# Patient Record
Sex: Female | Born: 1940
Health system: Southern US, Community
[De-identification: ages and names within clinical notes are randomized; demographics above are authoritative.]

## PROBLEM LIST (undated history)

## (undated) DIAGNOSIS — Z8719 Personal history of other diseases of the digestive system: Secondary | ICD-10-CM

## (undated) DIAGNOSIS — F419 Anxiety disorder, unspecified: Secondary | ICD-10-CM

## (undated) DIAGNOSIS — R1310 Dysphagia, unspecified: Secondary | ICD-10-CM

## (undated) DIAGNOSIS — G44039 Episodic paroxysmal hemicrania, not intractable: Secondary | ICD-10-CM

## (undated) DIAGNOSIS — K5730 Diverticulosis of large intestine without perforation or abscess without bleeding: Secondary | ICD-10-CM

## (undated) DIAGNOSIS — Z8669 Personal history of other diseases of the nervous system and sense organs: Secondary | ICD-10-CM

## (undated) DIAGNOSIS — D259 Leiomyoma of uterus, unspecified: Secondary | ICD-10-CM

## (undated) DIAGNOSIS — E119 Type 2 diabetes mellitus without complications: Secondary | ICD-10-CM

## (undated) DIAGNOSIS — Z973 Presence of spectacles and contact lenses: Secondary | ICD-10-CM

## (undated) DIAGNOSIS — F32A Depression, unspecified: Secondary | ICD-10-CM

## (undated) DIAGNOSIS — C801 Malignant (primary) neoplasm, unspecified: Secondary | ICD-10-CM

## (undated) DIAGNOSIS — K649 Unspecified hemorrhoids: Secondary | ICD-10-CM

## (undated) DIAGNOSIS — R739 Hyperglycemia, unspecified: Secondary | ICD-10-CM

## (undated) DIAGNOSIS — K635 Polyp of colon: Secondary | ICD-10-CM

## (undated) DIAGNOSIS — M5137 Other intervertebral disc degeneration, lumbosacral region: Secondary | ICD-10-CM

## (undated) DIAGNOSIS — M51369 Other intervertebral disc degeneration, lumbar region without mention of lumbar back pain or lower extremity pain: Secondary | ICD-10-CM

## (undated) DIAGNOSIS — Z720 Tobacco use: Secondary | ICD-10-CM

## (undated) DIAGNOSIS — L719 Rosacea, unspecified: Secondary | ICD-10-CM

## (undated) DIAGNOSIS — H269 Unspecified cataract: Secondary | ICD-10-CM

## (undated) DIAGNOSIS — Z9889 Other specified postprocedural states: Secondary | ICD-10-CM

## (undated) DIAGNOSIS — M5136 Other intervertebral disc degeneration, lumbar region: Secondary | ICD-10-CM

## (undated) DIAGNOSIS — K589 Irritable bowel syndrome without diarrhea: Secondary | ICD-10-CM

## (undated) DIAGNOSIS — R918 Other nonspecific abnormal finding of lung field: Secondary | ICD-10-CM

## (undated) DIAGNOSIS — I499 Cardiac arrhythmia, unspecified: Secondary | ICD-10-CM

## (undated) DIAGNOSIS — F458 Other somatoform disorders: Secondary | ICD-10-CM

## (undated) DIAGNOSIS — M1990 Unspecified osteoarthritis, unspecified site: Secondary | ICD-10-CM

## (undated) DIAGNOSIS — I10 Essential (primary) hypertension: Secondary | ICD-10-CM

## (undated) DIAGNOSIS — M51379 Other intervertebral disc degeneration, lumbosacral region without mention of lumbar back pain or lower extremity pain: Secondary | ICD-10-CM

## (undated) DIAGNOSIS — I480 Paroxysmal atrial fibrillation: Secondary | ICD-10-CM

## (undated) DIAGNOSIS — F329 Major depressive disorder, single episode, unspecified: Secondary | ICD-10-CM

## (undated) DIAGNOSIS — Z87898 Personal history of other specified conditions: Secondary | ICD-10-CM

## (undated) DIAGNOSIS — T783XXA Angioneurotic edema, initial encounter: Secondary | ICD-10-CM

## (undated) HISTORY — DX: Other nonspecific abnormal finding of lung field: R91.8

## (undated) HISTORY — DX: Diverticulosis of large intestine without perforation or abscess without bleeding: K57.30

## (undated) HISTORY — DX: Other intervertebral disc degeneration, lumbosacral region: M51.37

## (undated) HISTORY — DX: Other intervertebral disc degeneration, lumbosacral region without mention of lumbar back pain or lower extremity pain: M51.379

## (undated) HISTORY — DX: Other intervertebral disc degeneration, lumbar region without mention of lumbar back pain or lower extremity pain: M51.369

## (undated) HISTORY — DX: Unspecified cataract: H26.9

## (undated) HISTORY — DX: Polyp of colon: K63.5

## (undated) HISTORY — DX: Personal history of other diseases of the digestive system: Z87.19

## (undated) HISTORY — PX: CATARACT EXTRACTION: SUR2

## (undated) HISTORY — DX: Tobacco use: Z72.0

## (undated) HISTORY — DX: Malignant (primary) neoplasm, unspecified: C80.1

## (undated) HISTORY — PX: TONSILLECTOMY: SUR1361

## (undated) HISTORY — DX: Irritable bowel syndrome, unspecified: K58.9

## (undated) HISTORY — DX: Unspecified hemorrhoids: K64.9

## (undated) HISTORY — DX: Other intervertebral disc degeneration, lumbar region: M51.36

## (undated) HISTORY — DX: Other specified postprocedural states: Z98.890

## (undated) HISTORY — DX: Hyperglycemia, unspecified: R73.9

## (undated) HISTORY — DX: Rosacea, unspecified: L71.9

## (undated) HISTORY — DX: Personal history of other specified conditions: Z87.898

## (undated) HISTORY — DX: Angioneurotic edema, initial encounter: T78.3XXA

## (undated) HISTORY — PX: DILATION AND CURETTAGE OF UTERUS: SHX78

## (undated) HISTORY — DX: Other somatoform disorders: F45.8

## (undated) HISTORY — DX: Leiomyoma of uterus, unspecified: D25.9

## (undated) HISTORY — DX: Episodic paroxysmal hemicrania, not intractable: G44.039

## (undated) HISTORY — DX: Paroxysmal atrial fibrillation: I48.0

## (undated) HISTORY — PX: CATARACT EXTRACTION, BILATERAL: SHX1313

---
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
## 1965-10-30 HISTORY — PX: OTHER SURGICAL HISTORY: SHX169

## 1999-05-23 ENCOUNTER — Other Ambulatory Visit: Admission: RE | Admit: 1999-05-23 | Discharge: 1999-05-23 | Payer: Self-pay | Admitting: Obstetrics and Gynecology

## 2000-05-28 ENCOUNTER — Other Ambulatory Visit: Admission: RE | Admit: 2000-05-28 | Discharge: 2000-05-28 | Payer: Self-pay | Admitting: Obstetrics and Gynecology

## 2001-06-17 ENCOUNTER — Other Ambulatory Visit: Admission: RE | Admit: 2001-06-17 | Discharge: 2001-06-17 | Payer: Self-pay | Admitting: Obstetrics and Gynecology

## 2004-12-19 ENCOUNTER — Emergency Department (HOSPITAL_COMMUNITY): Admission: EM | Admit: 2004-12-19 | Discharge: 2004-12-19 | Payer: Self-pay | Admitting: Emergency Medicine

## 2005-01-12 ENCOUNTER — Ambulatory Visit: Payer: Self-pay | Admitting: Family Medicine

## 2005-01-16 ENCOUNTER — Encounter: Admission: RE | Admit: 2005-01-16 | Discharge: 2005-01-16 | Payer: Self-pay | Admitting: Family Medicine

## 2005-03-01 ENCOUNTER — Ambulatory Visit: Payer: Self-pay | Admitting: Family Medicine

## 2005-03-07 ENCOUNTER — Ambulatory Visit: Payer: Self-pay | Admitting: Family Medicine

## 2006-07-24 ENCOUNTER — Ambulatory Visit: Payer: Self-pay | Admitting: Gastroenterology

## 2006-08-07 ENCOUNTER — Encounter (INDEPENDENT_AMBULATORY_CARE_PROVIDER_SITE_OTHER): Payer: Self-pay | Admitting: *Deleted

## 2006-08-07 ENCOUNTER — Ambulatory Visit: Payer: Self-pay | Admitting: Gastroenterology

## 2006-08-23 ENCOUNTER — Encounter (INDEPENDENT_AMBULATORY_CARE_PROVIDER_SITE_OTHER): Payer: Self-pay | Admitting: Specialist

## 2006-08-23 ENCOUNTER — Ambulatory Visit (HOSPITAL_COMMUNITY): Admission: RE | Admit: 2006-08-23 | Discharge: 2006-08-23 | Payer: Self-pay | Admitting: Obstetrics and Gynecology

## 2006-12-27 ENCOUNTER — Encounter (INDEPENDENT_AMBULATORY_CARE_PROVIDER_SITE_OTHER): Payer: Self-pay | Admitting: Specialist

## 2006-12-27 ENCOUNTER — Ambulatory Visit (HOSPITAL_COMMUNITY): Admission: RE | Admit: 2006-12-27 | Discharge: 2006-12-27 | Payer: Self-pay | Admitting: Obstetrics and Gynecology

## 2007-09-19 ENCOUNTER — Ambulatory Visit: Payer: Self-pay | Admitting: Family Medicine

## 2007-10-31 HISTORY — PX: CHOLECYSTECTOMY: SHX55

## 2008-07-04 ENCOUNTER — Inpatient Hospital Stay (HOSPITAL_COMMUNITY): Admission: EM | Admit: 2008-07-04 | Discharge: 2008-07-06 | Payer: Self-pay | Admitting: Emergency Medicine

## 2008-07-04 ENCOUNTER — Encounter (INDEPENDENT_AMBULATORY_CARE_PROVIDER_SITE_OTHER): Payer: Self-pay | Admitting: Surgery

## 2008-07-09 ENCOUNTER — Inpatient Hospital Stay (HOSPITAL_COMMUNITY): Admission: AD | Admit: 2008-07-09 | Discharge: 2008-07-14 | Payer: Self-pay | Admitting: Surgery

## 2008-07-10 ENCOUNTER — Ambulatory Visit: Payer: Self-pay | Admitting: Vascular Surgery

## 2008-07-10 ENCOUNTER — Encounter (INDEPENDENT_AMBULATORY_CARE_PROVIDER_SITE_OTHER): Payer: Self-pay | Admitting: Surgery

## 2009-03-24 ENCOUNTER — Encounter: Payer: Self-pay | Admitting: Family Medicine

## 2009-08-10 ENCOUNTER — Encounter: Payer: Self-pay | Admitting: Family Medicine

## 2009-08-11 ENCOUNTER — Ambulatory Visit: Payer: Self-pay | Admitting: Family Medicine

## 2009-08-11 LAB — CONVERTED CEMR LAB
Bilirubin Urine: NEGATIVE
Urobilinogen, UA: 0.2
pH: 5

## 2009-08-17 LAB — CONVERTED CEMR LAB
ALT: 21 units/L (ref 0–35)
AST: 21 units/L (ref 0–37)
Albumin: 4.2 g/dL (ref 3.5–5.2)
BUN: 19 mg/dL (ref 6–23)
Basophils Absolute: 0 10*3/uL (ref 0.0–0.1)
Bilirubin, Direct: 0 mg/dL (ref 0.0–0.3)
Calcium: 9.5 mg/dL (ref 8.4–10.5)
Chloride: 103 meq/L (ref 96–112)
Eosinophils Absolute: 0.1 10*3/uL (ref 0.0–0.7)
Eosinophils Relative: 1.6 % (ref 0.0–5.0)
GFR calc non Af Amer: 88.23 mL/min (ref >60)
Glucose, Bld: 98 mg/dL (ref 70–99)
HCT: 39.8 % (ref 36.0–46.0)
HDL: 40.2 mg/dL (ref >39.00)
Lymphocytes Relative: 26.2 % (ref 12.0–46.0)
MCV: 89.1 fL (ref 78.0–100.0)
Monocytes Relative: 7.1 % (ref 3.0–12.0)
Neutrophils Relative %: 64.4 % (ref 43.0–77.0)
Platelets: 376 10*3/uL (ref 150.0–400.0)
Potassium: 4.6 meq/L (ref 3.5–5.1)
TSH: 0.78 microintl units/mL (ref 0.35–5.50)
Total Bilirubin: 0.7 mg/dL (ref 0.3–1.2)
Total CHOL/HDL Ratio: 4
Total Protein: 6.8 g/dL (ref 6.0–8.3)
Triglycerides: 101 mg/dL (ref 0.0–149.0)
VLDL: 20.2 mg/dL (ref 0.0–40.0)

## 2009-08-23 ENCOUNTER — Ambulatory Visit: Payer: Self-pay | Admitting: Pulmonary Disease

## 2010-03-29 ENCOUNTER — Encounter: Payer: Self-pay | Admitting: Family Medicine

## 2010-03-30 ENCOUNTER — Encounter: Payer: Self-pay | Admitting: *Deleted

## 2010-07-20 ENCOUNTER — Ambulatory Visit: Payer: Self-pay | Admitting: Family Medicine

## 2010-11-27 LAB — CONVERTED CEMR LAB
ALT: 22 units/L (ref 0–35)
AST: 21 units/L (ref 0–37)
BUN: 8 mg/dL (ref 6–23)
Basophils Absolute: 0 10*3/uL (ref 0.0–0.1)
Basophils Relative: 0.2 % (ref 0.0–1.0)
Bilirubin, Direct: 0.1 mg/dL (ref 0.0–0.3)
CO2: 26 meq/L (ref 19–32)
Cholesterol: 203 mg/dL (ref 0–200)
Eosinophils Absolute: 0.1 10*3/uL (ref 0.0–0.6)
Eosinophils Relative: 1 % (ref 0.0–5.0)
GFR calc Af Amer: 129 mL/min
Glucose, Bld: 94 mg/dL (ref 70–99)
HCT: 42.2 % (ref 36.0–46.0)
Hemoglobin: 14.9 g/dL (ref 12.0–15.0)
Lymphocytes Relative: 28.9 % (ref 12.0–46.0)
Monocytes Relative: 6.3 % (ref 3.0–11.0)
Neutro Abs: 5.6 10*3/uL (ref 1.4–7.7)
Platelets: 292 10*3/uL (ref 150–400)
Potassium: 4.1 meq/L (ref 3.5–5.1)
RBC: 4.79 M/uL (ref 3.87–5.11)
Sodium: 142 meq/L (ref 135–145)
Total Bilirubin: 0.6 mg/dL (ref 0.3–1.2)
Total Protein: 7.2 g/dL (ref 6.0–8.3)
VLDL: 23 mg/dL (ref 0–40)

## 2010-11-29 NOTE — Assessment & Plan Note (Signed)
Summary: FLU SHOT/CJR  Nurse Visit   Allergies: 1)  ! Biaxin 2)  ! Codeine  Orders Added: 1)  Flu Vaccine 1yrs + MEDICARE PATIENTS [Q2039] 2)  Administration Flu vaccine - MCR [G0008] Flu Vaccine Consent Questions     Do you have a history of severe allergic reactions to this vaccine? no    Any prior history of allergic reactions to egg and/or gelatin? no    Do you have a sensitivity to the preservative Thimersol? no    Do you have a past history of Guillan-Barre Syndrome? no    Do you currently have an acute febrile illness? no    Have you ever had a severe reaction to latex? no    Vaccine information given and explained to patient? yes    Are you currently pregnant? no    Lot Number:[id number]   Exp Date:04/29/2011   Site Given  Left Deltoid IM .lbmedflu

## 2010-11-29 NOTE — Miscellaneous (Signed)
  Clinical Lists Changes  Observations: Added new observation of MAMMOGRAM: normal (03/29/2010 10:33)      Preventive Care Screening  Mammogram:    Date:  03/29/2010    Results:  normal

## 2010-12-22 ENCOUNTER — Other Ambulatory Visit: Payer: Self-pay | Admitting: Dermatology

## 2011-03-14 NOTE — Discharge Summary (Signed)
[name], [name]                  ACCOUNT NO.:  [id number]   MEDICAL RECORD NO.:  [id number]          PATIENT TYPE:  INP   LOCATION:  5118                         FACILITY:  [facility]   PHYSICIAN:  Thornton Park. Daphine Deutscher, MD  DATE OF BIRTH:  1941/08/01   DATE OF ADMISSION:  07/09/2008  DATE OF DISCHARGE:  07/14/2008                               DISCHARGE SUMMARY   ADMITTING DIAGNOSIS:  Abdominal pain after cholecystectomy.   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   COURSE IN THE HOSPITAL:  Angel Holland is a 70 year old lady who had a lap  cole on July 04, 2008, for severe cholecystitis.  Drain was placed  and she was discharged on July 06, 2008.  Her wounds were looking  fine, but she started having pain and she came back and was readmitted.  There was some dark drainage in her JP and there was a question whether  she might have had a bile leak.  Studies did not show any evidence of a  bile leak and the drainage remained minimal.  The ultrasound showed a  questionable clot in the middle hepatic vein.  I discussed this with a  hematology oncology consultant who was totally on the fence whether to  treat this or not.  I discussed coumadinization and actually initially  she got it 1 mg dose of Coumadin.  But after talking further with her,  she had significant reservations because her husband had an  intracerebral hemorrhage while on Coumadin.  She seemed to be very well  aware of the risk and did not want to take Coumadin.  I discussed this  with her length and we decided that we would go forward with her  continuing her baby aspirin every day.  Therefore, on July 14, 2008, I removed her Jackson-Pratt drain, which had minimal  serosanguineous drainage, discharged her to take an aspirin every day.   FINAL DIAGNOSES:  1. Status post laparoscopic cholecystectomy.  2. Pain resolved with evidence of middle hepatic vein partial      nonoccluding  thrombus.      Thornton Park Daphine Deutscher, MD  Electronically Signed     MBM/MEDQ  D:  07/14/2008  T:  07/14/2008  Job:  [id number]   cc:   Wilmon Arms. Tsuei, M.D.

## 2011-03-14 NOTE — H&P (Signed)
NAMEKIMAYA, Angel Holland                  ACCOUNT NO.:  [id number]   MEDICAL RECORD NO.:  [id number]          PATIENT TYPE:  INP   LOCATION:  5128                         FACILITY:  [facility]   PHYSICIAN:  Ollen Gross. Vernell Morgans, M.D. DATE OF BIRTH:  1941-08-23   DATE OF ADMISSION:  07/03/2008  DATE OF DISCHARGE:                              HISTORY & PHYSICAL   Angel Holland is a 70 year old white female who went to [employer] this  past Wednesday, had about 3 slices of pizza, and then that evening  developed severe right upper quadrant pain.  The pain has been  associated with nausea and vomiting.  The pain has not improved since  Wednesday.  She has run some low grade fevers at home.  She did have  some diarrhea associated with this as well.  She otherwise denies any  chest pain, shortness of breath, or dysuria.  Her other review of  systems are unremarkable.   PAST MEDICAL HISTORY:  Significant for uterine tumor.   PAST SURGICAL HISTORY:  Significant for appendectomy, tubal ligation,  and D&C.   MEDICATIONS:  None.   ALLERGIES:  BIAXIN and CODEINE.   SOCIAL HISTORY:  She does smoke about a pack of cigarettes a day and  denies any alcohol use.   FAMILY HISTORY:  Noncontributory.   PHYSICAL EXAMINATION:  VITAL SIGNS:  Temperature is 99.5, blood pressure  is 97/64, and pulse is 84.  GENERAL:  Well-developed and well-nourished white female in no acute  distress.  SKIN:  Warm and dry.  No Jaundice.  HEENT:  Eyes, extraocular movements are intact.  Pupils are equal,  round, and reactive to light.  Sclerae nonicteric.  LUNGS:  Clear bilaterally with no use of accessory inspiratory muscles.  HEART:  Regular rate and rhythm with impulse in the left chest.  ABDOMEN:  Soft, but she has moderate-to-severe right upper quadrant  tenderness with guarding in that spot.  No general peritonitis.  EXTREMITIES:  No cyanosis, clubbing, or edema.  Good strength in arms  and legs.  PSYCHOLOGIC:  Alert and  oriented x3 with no evidence of anxiety or  depression.   On review of her lab work, it was significant for a white count of  17,000 and her LFTs were normal.  Her ultrasound was reviewed with  radiologist, it did show stones in her gallbladder and some gallbladder  wall thickening, but no ductal dilatation.   ASSESSMENT AND PLAN:  This is a 70 year old white female with what  appears to be cholecystitis with cholelithiasis.  Because of the risk of  further painful episodes, I did think she would benefit from having her  gallbladder removed.  We will plan to admit her in 5100 and start her on  broad-spectrum antibiotics, and then plan for surgery in the next day or  so.  I have discussed to her in detail the risks and benefits of the  operation due to this as well as some technical aspects, which she  understands, and wish to proceed.      Ollen Gross. Vernell Morgans, M.D.  Electronically Signed     PST/MEDQ  D:  07/04/2008  T:  07/04/2008  Job:  [id number]

## 2011-03-14 NOTE — Op Note (Signed)
Angel Holland, Angel Holland                  ACCOUNT NO.:  [id number]   MEDICAL RECORD NO.:  [id number]          PATIENT TYPE:  INP   LOCATION:  5128                         FACILITY:  [facility]   PHYSICIAN:  Wilmon Arms. Corliss Skains, M.D. DATE OF BIRTH:  June 30, 1941   DATE OF PROCEDURE:  07/04/2008  DATE OF DISCHARGE:                               OPERATIVE REPORT   PREOPERATIVE DIAGNOSIS:  Acute cholecystitis.   POSTOPERATIVE DIAGNOSIS:  Acute cholecystitis.   PROCEDURE PERFORMED:  Laparoscopic cholecystectomy with intraoperative  cholangiogram.   SURGEON:  Wilmon Arms. Corliss Skains, MD, FACS   ASSISTANT:  Clovis Pu. Cornett, MD   ANESTHESIA:  General endotracheal.   INDICATIONS:  The patient is a 70 year old female, who presented with  onset of severe right upper quadrant pain that has been present since  Wednesday.  She presented to the emergency department on Friday night.  She has had nausea and vomiting.  An ultrasound showed stones with wall  thickening.  Her liver function tests were within normal limits.  Her  white count was elevated at 17.  She was admitted to the hospital,  started on intravenous antibiotics.  She comes to operating room today  for urgent cholecystectomy.   DESCRIPTION OF PROCEDURE:  The patient was brought to the operating  room, placed in the supine position on operating table.  After an  adequate level of general anesthesia was obtained, the patient's abdomen  was prepped with Betadine and draped in sterile fashion.  A time-out was  taken to assure proper patient, proper procedure.  She had a previous  infraumbilical laparoscopic incision.  We opened this with a scalpel.  Dissection was carried down to the fascia.  The fascia was opened  vertically.  We entered the peritoneal cavity bluntly.  A stay sutures  of 0 Vicryl was placed around the fascial opening.  The Hasson cannula  was inserted, secured the stay suture.  Pneumoperitoneum was obtained by  insufflating, CO2  maintained at maximum pressure of 15 mmHg.  A  laparoscope was inserted.  A very thickened, distended, erythematous  gallbladder was identified.  The gallbladder actually appeared about the  same color as the liver.  There was some yellow ascites in the right  pericolic gutter.  The omentum was adherent to the gallbladder.  A 11-mm  port was placed in the subxiphoid position.  Two 5-mm ports placed in  the right upper quadrant.  Blunt dissection was used to dissect the  omentum away from the gallbladder.  We were able to grasp the fundus of  the gallbladder with a Prestige grasper.  However, since we tried to  close the grasper, a small hole was made in the fundus of the  gallbladder due to its inflammation, some stones were spilled.  We tried  to extract as much of these as possible.  The gallbladder was then  elevated.  Blunt dissection was used to peel the adhesions away from the  surface of the gallbladder.  We continued using blunt dissection to open  the edematous peritoneum around the hilum of gallbladder.  Using  blunt  dissection with the suction tip, we were actually able to identify the  cystic duct and cystic artery.  The cystic duct was ligated, clipped  distally.  A small opening was created on the cystic duct.  I was able  to milk 2-3 gallstones out of the cystic duct.  A Wedeking cholangiogram  catheter was inserted through a stab incision, threaded into the cystic  duct.  It was secured with the clip.  A cholangiogram was obtained which  showed good flow proximally and distally, biliary tree with no sign of  obstruction or filling defect.  Contrast flowed easily in duodenum.  The  catheter was removed and a cystic duct was ligated with clips and  divided.  The cystic artery was then ligated with clips and divided.  Cautery was then used to remove the gallbladder from the liver.  This  was fairly difficult due to the large thickened gallbladder and the  surrounding inflammation.   We continued dissecting up along the  gallbladder fossa.  We encountered a very large posterior vein.  This  was bleeding rather briskly.  We controlled this with a large piece of  Surgicel and direct pressure.  This pressure was held for about 10  minutes.  This was successful in stopping the bleeding.  We then covered  this area with Tisseel.  No further bleeding was noted from the site.  We continued dissecting the gallbladder free.  This was again very  difficult due to the appearance and thickness of the gallbladder.  Several small holes were made inadvertently in the gallbladder, multiple  gallstones were spilled.  We were finally able to detach the gallbladder  and placed Endocatch sac.  We spent a lot of time, tried to section out  some blood clot and removed as many stones as possible.  There is no  doubt that there was some retained stones.  The right upper quadrant was  then thoroughly irrigated with saline.  We placed another piece a  Surgicel in the gallbladder fossa.  No further bleeding was noted.  The  gallbladder, Endocatch sac were then removed from umbilical port site.  We placed a drain, exiting through the most lateral right upper quadrant  port site.  The gallbladder fossa was thoroughly drained.  The drain was  placed to bulb suction after being secured with a 2-0 nylon suture.  Pneumoperitoneum was then released.  The trocars were all removed.  The  pursestring sutures were used to close umbilical fascia.  A 4-0 Monocryl  was used to close the skin incisions.  Steri-Strips and clean dressings  were applied.  The patient was then extubated, brought to recovery room  in stable condition.  All sponge, instrument, needle counts were  correct.      Wilmon Arms. Tsuei, M.D.  Electronically Signed     MKT/MEDQ  D:  07/04/2008  T:  07/05/2008  Job:  [id number]

## 2011-03-14 NOTE — Discharge Summary (Signed)
Angel Holland, Angel Holland                  ACCOUNT NO.:  [id number]   MEDICAL RECORD NO.:  [id number]          PATIENT TYPE:  INP   LOCATION:  5128                         FACILITY:  [facility]   PHYSICIAN:  Maisie Fus A. Cornett, M.D.DATE OF BIRTH:  February 07, 1941   DATE OF ADMISSION:  07/04/2008  DATE OF DISCHARGE:  07/06/2008                               DISCHARGE SUMMARY   ADMITTING DIAGNOSIS:  Acute cholecystitis.   DISCHARGE DIAGNOSIS:  Acute cholecystitis.   PROCEDURE PERFORMED:  Laparoscopic cholecystectomy with cholangiogram.   BRIEF HISTORY:  The patient is a 70 year old female admitted on  July 04, 2008, with right upper quadrant pain.  Ultrasound showed  acute cholecystitis as well as physical examination.  She was taken to  the operating room on July 04, 2008, by Dr. Manus Rudd for  laparoscopic cholecystectomy.  Please see operative note for details.   HOSPITAL COURSE:  The patient was stable on postop day 1.  She had  serosanguineous drainage from a JP drain.  Her hemoglobin was 9.9.  Vital signs were stable.  Wounds were clean, dry, and intact.  Over the  next 24 hours, her diet was advanced.  Her pain was better controlled.  She was ambulating.  Her IV was out, and she was doing well.  Wounds  were clean, dry, and intact.  Postop day 2, hemoglobin was 10.0 and  stable.  Her white count was 9900.  She did have a temperature of 99,  but otherwise appeared stable.  She was doing well and discharged home  on postop day 2 in satisfactory condition.   DISCHARGE INSTRUCTIONS:  She will follow up in 3-5 days with Dr. Corliss Skains  to have her drain removed.  She will go home on Vicodin for pain, 1-2  tablets q.4 p.r.n. pain.  She will resume her home medications of  aspirin and vitamin supplements.  She will be given a script also for  Cipro 500 mg p.o. b.i.d.  She will refrain from driving until all her  soreness is gone.  She will be given instructions about drain care.  She  will  resume her regular diet.  She will ambulate as tolerated, and she  will shower.   CONDITION ON DISCHARGE:  Improved.      Thomas A. Cornett, M.D.  Electronically Signed     TAC/MEDQ  D:  07/06/2008  T:  07/06/2008  Job:  [id number]

## 2011-03-14 NOTE — Discharge Summary (Signed)
Angel Holland, Angel Holland                  ACCOUNT NO.:  [id number]   MEDICAL RECORD NO.:  [id number]          PATIENT TYPE:  INP   LOCATION:  5118                         FACILITY:  [facility]   PHYSICIAN:  Thornton Park. Daphine Deutscher, MD  DATE OF BIRTH:  June 15, 1941   DATE OF ADMISSION:  07/09/2008  DATE OF DISCHARGE:  07/14/2008                               DISCHARGE SUMMARY   CHIEF COMPLAINT AND REASON FOR ADMISSION:  Angel Holland is a 70 year old  female patient status post laparoscopic cholecystectomy for severe  cholecystitis on July 04, 2008 because of significant blood loss  intraoperatively due to venous vascular bed bleeding.  Blake drain was  placed and the patient was discharged home.  On the date of admission,  she presented back to the clinic for evaluation by Dr. Corliss Skains because of  increasing right upper quadrant pain.  She has not had any fever, nausea  and vomiting.  Because of concerns for possible bile leak or abscess  formation and possible hematoma, the patient was admitted to the  hospital for further workup.   ADMITTING DIAGNOSES:  Increase right upper quadrant pain after  cholecystectomy rule out bile leak versus abscess.   HOSPITAL COURSE:  The patient was admitted into the general floor where  she was placed initially on n.p.o. status.  Initial blood work was  within normal limits.  Her JP output was dark without any evidence of  bile.  Ultrasound showed no evidence of an abscess but there was some  concern that she may have a clot in the middle hepatic vein.  A HIDA  scan was negative for leak.  MRA was planned in the interim. The patient  was subsequently started on IV heparin for anticoagulation and  coagulopathy panel was obtained.  This was on July 11, 2008.  The  MRA showed a nonocclusive thrombus present in the middle hepatic vein,  no evidence of tumor thrombus was seen.  Also, again no evidence of bile  leak or other issues.  No hematoma.  No abscess.  No myeloma  or  noninfected postoperative fluid collection.   By July 13, 2008, Dr. Daphine Holland had assumed care of the patient.  He  has discussed the possibility of initiating Coumadin on this patient  with several colleagues postsurgical and medical and there is no  definite consensus as to whether Coumadin is actually indicated in this  situation.  The option of 3 months of Coumadin was brought up with the  patient but she was frightened of using Coumadin given the fact her  husband had some sort of unexpected event that she relates to use of the  Coumadin which she describes what sounds like intracranial or  intracerebral bleeding despite being on Coumadin for 10 years.  Therefore, she was quite tearful and elected to begin Coumadin therapy.  Dr. Daphine Holland sat down with the patient and had a long discussion with her  as to whether Coumadin was actually indicated.  She again reiterated  that she did not wish to take this medication so Dr. Daphine Holland opted to not  begin  Coumadin, stop her heparin and give her aspirin daily.  Her JP  drain was discontinued and she was otherwise appropriate for discharge  home.  Important note that since initiation of heparin therapy and  adequate pain management, the patient has had no further right upper  quadrant abdominal pain and has tolerated solid diet without difficulty.  Her last labs were checked on July 13, 2008, hemoglobin was stable  at 10.6, white count 8100 and platelets were 464,000.   FINAL DISCHARGE DIAGNOSES:  1. Right upper quadrant abdominal pain after cholecystectomy.  2. 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  3. Initiation of low grade anticoagulation with aspirin therapy.   DISCHARGE MEDICATIONS:  The patient will resume the following home  medications.  1. Vitamin B at bedtime.  2. Vitamin E at bedtime.  3. Calcium at bedtime.  4. Increase aspirin to 325 mg daily.  5. Continue same.   DISCHARGE INSTRUCTIONS:  1. As  given to you from prior postsurgical discharge per Dr. Corliss Skains.  2. Follow up with Dr. Corliss Skains as directed.      Angel L. Gwyneth Sprout Daphine Deutscher, MD  Electronically Signed    ALE/MEDQ  D:  07/14/2008  T:  07/15/2008  Job:  [id number]

## 2011-03-17 NOTE — Op Note (Signed)
NAME:  Angel Holland, Angel Holland                  ACCOUNT NO.:  [id number]   MEDICAL RECORD NO.:  [id number]          PATIENT TYPE:  AMB   LOCATION:                                FACILITY:  [facility]   PHYSICIAN:  Kendra H. Tenny Craw, MD     DATE OF BIRTH:  01-19-1941   DATE OF PROCEDURE:  12/27/2006  DATE OF DISCHARGE:                               OPERATIVE REPORT   PREOPERATIVE DIAGNOSIS:  Hematometra.   POSTOPERATIVE DIAGNOSIS:  Necrotic polyp.   PROCEDURE:  Hysteroscopy, dilation and curettage.   SURGEON:  Freddrick March. Tenny Craw, M.D.   ASSISTANT:  None.   ANESTHESIA:  General endotracheal anesthesia.   SPECIMENS:  Endometrial curettings.   ESTIMATED BLOOD LOSS:  Minimal.   COMPLICATIONS:  None.   DESCRIPTION OF PROCEDURE:  [name] is a 70 year old white female who  had undergone a hysteroscopy, D&C in November 2007 for a thickened  endometrial stripe and was found to have a large benign endometrial  polyp at that time.  She had done well postoperatively and then re-  presented in December complaining of some intermittent spotting.  She  then again came back a month later, now complaining of severe pelvic  pain.  At that time, an ultrasound was performed that demonstrated a  thickened endometrial stripe, with some fluid within the endometrial  cavity consistent with old blood.  She had an endometrial stripe of 1.3  cm at that time.  She received pain medication at that time and was  advised that hysteroscopy should be performed to evaluate further the  thickened endometrial stripe.  Due to her scheduling, surgery was put  off until the end of February.  A week prior to surgery, she stated that  her pain and bleeding had completely resolved, and questioned the  necessity of surgery.  A repeat transvaginal ultrasound was performed,  which redemonstrated a thickened endometrial stripe.  This time,  measuring 0.77 cm.  She was advised that we needed to continue with  surgery.  She presented today for  hysteroscopy, D&C.  Following the  appropriate informed consent, the patient was brought to the operating  room, placed in the dorsal supine position in [place] stirrups, prepped  and draped in the normal sterile fashion after general endotracheal  anesthesia was administered.  A speculum was placed in the vagina, a  single-toothed tenaculum was placed on the anterior lip of the cervix.  The cervix was serially dilated up to 15 Hank dilator.  The hysteroscope  was passed transcervically into the intrauterine cavity.  Within the  uterine cavity, black, necrotic tissue was noted in the endometrial  cavity.  The ostia were easily visualized bilaterally.  The hysteroscope  was then removed and sharp curettage was performed, with removal of this  tissue.  The hysteroscope was passed a second time, and confirmed  complete removal of this black, necrotic tissue.  One final sharp  curettage was performed and the procedure was  completed.  The single-toothed tenaculum was removed from the anterior  lip of the cervix.  The hysteroscope was removed.  The patient was  extubated in the operating room and brought to the recovery room in  stable condition following the procedure.      Freddrick March. Tenny Craw, MD  Electronically Signed     KHR/MEDQ  D:  12/27/2006  T:  12/27/2006  Job:  [id number]

## 2011-03-17 NOTE — Op Note (Signed)
NAME:  Angel Holland, Angel Holland                  ACCOUNT NO.:  [id number]   MEDICAL RECORD NO.:  [id number]          PATIENT TYPE:  AMB   LOCATION:  SDC                           FACILITY:  [facility]   PHYSICIAN:  Kendra H. Tenny Craw, MD     DATE OF BIRTH:  [birth year]   DATE OF PROCEDURE:  08/23/2006  DATE OF DISCHARGE:                                 OPERATIVE REPORT   PREOPERATIVE DIAGNOSIS:  Endometrial thickening.   POSTOPERATIVE DIAGNOSIS:  Endometrial polyp.   ESTIMATED BLOOD LOSS:  Minimal.   SPECIMENS:  Endometrial curettings and endometrial polyp.   SURGEON:  Freddrick March. Tenny Craw, MD   ANESTHESIA:  General endotracheal with LMA.   FLUID DEFICIT AT THE END OF THE CASE:  Negative 75 mL.   DESCRIPTION OF PROCEDURE:  Ms. Haisley is a 70 year old G3, P2-0-1-2, who  presented for her annual exam in September in 2007.  At this time she did  complain of some lower abdominal pelvic similar to the discomfort that she  felt when she had her period.  A transvaginal ultrasound was performed which  demonstrated a uterus measuring 6.71 x 4.10 x 5.07 cm.  The ovaries could  not be visualized.  The endometrial stripe measured 2.10 cm and within the  stripe there are multiple cystic appearing 7 cm cystic appearing areas.  Given the appearance of the endometrium, the patient was counseled on  endometrial biopsy versus hysteroscopy/D&C.  Given the ultrasound findings,  the decision was made to proceed with hysteroscopy D&C for diagnosis and  therapeutic intervention.  Following the appropriate informed consent, the  patient was brought to the operating room where general endotracheal  anesthesia __________ and LMA was administered.  She was placed in dorsal  supine position in the Gallaway stirrups, prepped and draped in the normal  sterile fashion.  A Graves speculum was placed in the vagina and the  anterior lip of the cervix was grasped with a single toothed tenaculum.  The  speculum was removed and a right angle Sims  retractor was placed in the  posterior vagina.  The cervix was serially dilated.  Initially there was  difficulty passing the sound and a hemostat was used to break up cervical  stenosis.  The patient's uterus was sounded to 7 cm and the cervix was  serially dilated.  Prior to dilating the cervix, a paracervical block with  1% lidocaine was injected circumferentially around the cervix.  The  hysteroscope was then passed transcervically under direct visualization.  In  the uterine cavity there was noted to be a large central appearing polyp and  a long the walls of the uterus there were several small yellow-appearing  areas along the lining of the uterus similar in appearance to a cholesterol  deposit.  Both ostia were visualized bilaterally, a polyp forcep was then  passed.  Some tissue was removed.  A sharp curettage was performed several  times.  Hysteroscope was then passed again and since the polyp was still  noted to be present, several more attempts with these polyp forceps and  sharp  curettage were performed until an adequate amount of tissue was  removed.  Of note, there seems to be a large amount of large amount of mucin-  like fluid coming from the  endometrial curettings.  The polyp was removed in small pieces and sent to  pathology for further diagnosis.  The patient tolerated the procedure well  was brought to the recovery room in stable condition following the  extubation in the operating room.           ______________________________  Freddrick March Tenny Craw, MD     KHR/MEDQ  D:  08/23/2006  T:  08/24/2006  Job:  [id number]

## 2011-03-17 NOTE — Assessment & Plan Note (Signed)
[facility]                           GASTROENTEROLOGY OFFICE NOTE   CASSARA, NIDA                         MRN:          [id number]  DATE:07/24/2006                            DOB:          1941-04-20    CONSULTING PHYSICIAN:  Vania Rea. Jarold Motto, M.D.,  [facility], [place]   IDENTIFYING DATA AND REASON FOR CONSULTATION:  Ms. Peto is a very pleasant  70 year old white female retiree from [employer] is referred  through the courtesy of Dr. Tawanna Cooler for consideration of colonoscopic  screening.   Apparently Ms. Louissaint had a colonoscopy 15 years ago, but these records are  not available for review at this time.  She has two loose bowel movements a  daughter as her normal pattern, but occasionally will have severe spasmodic  bilateral lower quadrant pain, which apparently has been assigned in the  past to ruptured cysts.  She did have a pelvic ultrasound done  transvaginally by Dr. Waynard Reeds within the last two weeks, the results of  which are unclear.   The patient denies any upper gastrointestinal or hepatobiliary complaints.  She does have some external hemorrhoids and occasionally she will see some  bright red blood per rectum.  Her appetite is good and her weight is stable.  She denies any food intolerances, anorexia or weight loss.   PAST MEDICAL HISTORY:  The past medical history is otherwise  noncontributory.   MEDICATIONS:  The only medications she is on at this time is aspirin 81 mg a  day and vitamins B, E, and calcium replacement.   ALLERGIES AND/OR DRUG INTOLERANCES:  The patient in the past has had nausea  with CODEINE use.   FAMILY HISTORY:  The family history is remarkable for a who apparently had  oral carcinoma.  Her mother just suffered from diabetes.  There is no known  history of colon polyps or colon carcinoma.   SOCIAL HISTORY:  The patient is married and lives with her husband.  She has  a Insurance claims handler.   She smoked one pack of cigarettes per day for  many years and denies ethanol intake.   REVIEW OF SYSTEMS:  The review of systems is noncontributory.  Her last  menstrual period was in 1988.  She specifically denies cardiovascular,  pulmonary, neurologic, psychiatric, orthopedic, or endocrine problems.   PHYSICAL EXAMINATION:  GENERAL APPEARANCE:  The patient is a healthy-  appearing white female who appears her stated age and in no acute distress.  I cannot appreciate stigmata of chronic liver disease.  VITAL SIGNS:  The patient is 5 feet tall and weighs 128 pounds.  Blood  pressure is 114/66 and pulse is 56 and regular.  HEAD AND NECK:  I cannot appreciate stigmata of chronic liver disease or  thyromegaly.  CHEST:  The patient's chest is clear anteriorly and posteriorly.  HEART:  The patient appears to be in a regular rhythm without significant  murmurs, gallops or rubs.  ABDOMEN:  I cannot appreciate hepatosplenomegaly nor abdominal masses or  tenderness.  Bowel sounds are normal.  EXTREMITIES:  The  extremities are unremarkable.  NEUROLOGIC EXAMINATION:  Mental status is clear.   ASSESSMENT:  1. Probable diverticulosis coli with intermittent crampy abdominal pain.  2. Vague history of recurrent ovarian cysts.  3. Intermittent hematochezia, probably from hemorrhoids - rule out colon      polyps.  4. History of chronic cigarette abuse.   RECOMMENDATIONS:  1. I have gone ahead and set Ms. Fronek for outpatient colonoscopy off      salicylate therapy at her convenience.  I have given her some printed      information concerning diverticulosis management.  2. The patient is to continue her follow ups with Dr. Tawanna Cooler and Dr. Tenny Craw      otherwise planned in the interim.  3. I will make an effort to try to find her previous colonoscopic exam in      our records.                                   Vania Rea. Jarold Motto, MD, [facility], [place]   DRP/MedQ  DD:  07/24/2006  DT:  07/26/2006  Job #:   [id number]   cc:   Tinnie Gens A. Tawanna Cooler, MD  Freddrick March. Tenny Craw, MD

## 2011-07-31 LAB — CBC
HCT: 31.3 — ABNORMAL LOW
MCV: 90.3
RBC: 3.46 — ABNORMAL LOW

## 2011-08-02 LAB — COMPREHENSIVE METABOLIC PANEL
ALT: 56 — ABNORMAL HIGH
ALT: 88 — ABNORMAL HIGH
AST: 67 — ABNORMAL HIGH
Albumin: 2.5 — ABNORMAL LOW
Alkaline Phosphatase: 54
Alkaline Phosphatase: 66
Alkaline Phosphatase: 86
BUN: 5 — ABNORMAL LOW
CO2: 26
CO2: 26
Calcium: 8.8
Calcium: 9.8
Chloride: 101
Chloride: 107
GFR calc Af Amer: >60
GFR calc Af Amer: >60
GFR calc non Af Amer: >60
GFR calc non Af Amer: >60
Glucose, Bld: 132 — ABNORMAL HIGH
Glucose, Bld: 184 — ABNORMAL HIGH
Glucose, Bld: 96
Potassium: 3.9
Potassium: 4.2
Sodium: 137
Sodium: 137
Total Bilirubin: 0.3
Total Bilirubin: 0.6
Total Bilirubin: 0.7
Total Protein: 6
Total Protein: 7.5

## 2011-08-02 LAB — POCT CARDIAC MARKERS
CKMB, poc: <1 — ABNORMAL LOW
Myoglobin, poc: 155

## 2011-08-02 LAB — DIFFERENTIAL
Basophils Absolute: 0
Eosinophils Relative: 0
Monocytes Absolute: 1.3 — ABNORMAL HIGH
Monocytes Relative: 7
Neutro Abs: 14.4 — ABNORMAL HIGH
Neutrophils Relative %: 81 — ABNORMAL HIGH

## 2011-08-02 LAB — LIPASE, BLOOD
Lipase: 18
Lipase: 20

## 2011-08-02 LAB — CBC
HCT: 31.8 — ABNORMAL LOW
HCT: 31.9 — ABNORMAL LOW
HCT: 43.7
Hemoglobin: 10.3 — ABNORMAL LOW
Hemoglobin: 10.7 — ABNORMAL LOW
Hemoglobin: 11 — ABNORMAL LOW
Hemoglobin: 11.2 — ABNORMAL LOW
Hemoglobin: 14.4
MCHC: 32.8
MCHC: 32.9
MCHC: 33.4
MCHC: 33.4
MCHC: 33.7
MCHC: 34
MCV: 89.5
MCV: 90.7
MCV: 91.7
Platelets: 253
Platelets: 255
Platelets: 378
Platelets: 464 — ABNORMAL HIGH
RBC: 4.82
RDW: 12.8
RDW: 12.9
RDW: 13
RDW: 13.1
RDW: 13.2
RDW: 13.2
RDW: 13.2
RDW: 13.2
WBC: 8.6

## 2011-08-02 LAB — MISCELLANEOUS TEST

## 2011-08-02 LAB — CARDIOLIPIN ANTIBODIES, IGG, IGM, IGA
Anticardiolipin IgG: <7 — ABNORMAL LOW (ref <11)
Anticardiolipin IgM: <7 — ABNORMAL LOW (ref <10)

## 2011-08-02 LAB — LUPUS ANTICOAGULANT PANEL
DRVVT: 52.8 — ABNORMAL HIGH (ref 36.1–47.0)
PTT Lupus Anticoagulant: 53.7 — ABNORMAL HIGH (ref 36.3–48.8)
PTTLA 4:1 Mix: 47.3 (ref 36.3–48.8)

## 2011-08-02 LAB — APTT: aPTT: 28

## 2011-08-02 LAB — HEPARIN LEVEL (UNFRACTIONATED)
Heparin Unfractionated: 0.27 — ABNORMAL LOW
Heparin Unfractionated: 0.34
Heparin Unfractionated: 0.37

## 2011-08-02 LAB — PROTHROMBIN GENE MUTATION

## 2011-08-02 LAB — PROTEIN C ACTIVITY: Protein C Activity: 169 % — ABNORMAL HIGH (ref 75–133)

## 2011-08-02 LAB — AMYLASE: Amylase: 51

## 2011-08-02 LAB — HOMOCYSTEINE: Homocysteine: 8.3

## 2011-08-02 LAB — PROTIME-INR: INR: 1.1

## 2011-08-28 ENCOUNTER — Encounter: Payer: Self-pay | Admitting: Family Medicine

## 2011-08-28 ENCOUNTER — Ambulatory Visit (INDEPENDENT_AMBULATORY_CARE_PROVIDER_SITE_OTHER): Payer: Medicare Other | Admitting: Family Medicine

## 2011-08-28 DIAGNOSIS — R351 Nocturia: Secondary | ICD-10-CM

## 2011-08-28 DIAGNOSIS — G4700 Insomnia, unspecified: Secondary | ICD-10-CM

## 2011-08-28 DIAGNOSIS — R32 Unspecified urinary incontinence: Secondary | ICD-10-CM

## 2011-08-28 LAB — POCT URINALYSIS DIPSTICK
Bilirubin, UA: NEGATIVE
Ketones, UA: NEGATIVE
Protein, UA: NEGATIVE
Spec Grav, UA: 1.015
pH, UA: 5

## 2011-08-28 MED ORDER — OXYBUTYNIN CHLORIDE 5 MG PO TABS: ORAL_TABLET | ORAL | Status: DC

## 2011-08-28 NOTE — Progress Notes (Signed)
  Subjective:    Patient ID: Angel Holland, female    DOB: [birth year], 70 y.o.   MRN: [id number]  HPI Collyns is a 70 year old female, who comes in today for evaluation of sleep dysfunction, secondary to nocturia x 6.  She states on a typical night if she goes to bed at 1030 showed her to sleep around 11, but then be up about every hour and a half having to urinate.  She denies any fever, chills, burning, et Karie Soda.  This is been going on for a year.  She states that previously she slept well at night because she took Benadryl and Motrin.?????????   Review of Systems    General and neurologic review of systems otherwise negative Objective:   Physical Exam  Well-developed well-nourished, female, in no acute distress.  Examination the abdomen is negative.  Pelvic examination shows postmenopausal vaginal changes.  No masses.  There is a white spot on the right labia, where she had a lesion removed.  It was benign.  Years ago.      Assessment & Plan:  Nocturia plan avoid caffeine begin Ditropan 2.5 mg b.i.d. Urologic consult ASAP

## 2011-08-28 NOTE — Patient Instructions (Signed)
Take 25 mg of Benadryl at bedtime, along with one Ditropan tablets  Avoid caffeine.  Call the urology Center and arrange consult ASAP for further evaluation

## 2011-10-03 ENCOUNTER — Other Ambulatory Visit: Payer: Self-pay | Admitting: Obstetrics and Gynecology

## 2011-11-06 DIAGNOSIS — N393 Stress incontinence (female) (male): Secondary | ICD-10-CM | POA: Diagnosis not present

## 2011-11-07 ENCOUNTER — Encounter: Payer: Self-pay | Admitting: Gastroenterology

## 2012-04-22 DIAGNOSIS — Z1231 Encounter for screening mammogram for malignant neoplasm of breast: Secondary | ICD-10-CM | POA: Diagnosis not present

## 2012-04-25 ENCOUNTER — Other Ambulatory Visit: Payer: Self-pay

## 2012-04-25 DIAGNOSIS — C44320 Squamous cell carcinoma of skin of unspecified parts of face: Secondary | ICD-10-CM | POA: Diagnosis not present

## 2012-04-25 DIAGNOSIS — D0439 Carcinoma in situ of skin of other parts of face: Secondary | ICD-10-CM | POA: Diagnosis not present

## 2012-04-25 DIAGNOSIS — L821 Other seborrheic keratosis: Secondary | ICD-10-CM | POA: Diagnosis not present

## 2012-04-25 DIAGNOSIS — L570 Actinic keratosis: Secondary | ICD-10-CM | POA: Diagnosis not present

## 2012-04-25 DIAGNOSIS — L719 Rosacea, unspecified: Secondary | ICD-10-CM | POA: Diagnosis not present

## 2012-04-25 DIAGNOSIS — L851 Acquired keratosis [keratoderma] palmaris et plantaris: Secondary | ICD-10-CM | POA: Diagnosis not present

## 2012-05-30 DIAGNOSIS — C44320 Squamous cell carcinoma of skin of unspecified parts of face: Secondary | ICD-10-CM | POA: Diagnosis not present

## 2012-07-22 ENCOUNTER — Encounter: Payer: Self-pay | Admitting: Gastroenterology

## 2012-10-22 ENCOUNTER — Encounter (HOSPITAL_COMMUNITY): Payer: Self-pay | Admitting: Emergency Medicine

## 2012-10-22 ENCOUNTER — Emergency Department (HOSPITAL_COMMUNITY)
Admission: EM | Admit: 2012-10-22 | Discharge: 2012-10-22 | Disposition: A | Discharge status: Home / Self Care | Payer: Medicare Other | Attending: Emergency Medicine | Admitting: Emergency Medicine

## 2012-10-22 ENCOUNTER — Emergency Department (HOSPITAL_COMMUNITY): Payer: Medicare Other

## 2012-10-22 DIAGNOSIS — T148XXA Other injury of unspecified body region, initial encounter: Secondary | ICD-10-CM | POA: Diagnosis not present

## 2012-10-22 DIAGNOSIS — IMO0002 Reserved for concepts with insufficient information to code with codable children: Secondary | ICD-10-CM | POA: Insufficient documentation

## 2012-10-22 DIAGNOSIS — Z7982 Long term (current) use of aspirin: Secondary | ICD-10-CM | POA: Diagnosis not present

## 2012-10-22 DIAGNOSIS — S40029A Contusion of unspecified upper arm, initial encounter: Secondary | ICD-10-CM | POA: Diagnosis not present

## 2012-10-22 DIAGNOSIS — Z8659 Personal history of other mental and behavioral disorders: Secondary | ICD-10-CM | POA: Diagnosis not present

## 2012-10-22 DIAGNOSIS — S8990XA Unspecified injury of unspecified lower leg, initial encounter: Secondary | ICD-10-CM | POA: Diagnosis not present

## 2012-10-22 DIAGNOSIS — M79609 Pain in unspecified limb: Secondary | ICD-10-CM | POA: Diagnosis not present

## 2012-10-22 DIAGNOSIS — M549 Dorsalgia, unspecified: Secondary | ICD-10-CM | POA: Diagnosis not present

## 2012-10-22 DIAGNOSIS — S20229A Contusion of unspecified back wall of thorax, initial encounter: Secondary | ICD-10-CM | POA: Diagnosis not present

## 2012-10-22 DIAGNOSIS — M25519 Pain in unspecified shoulder: Secondary | ICD-10-CM | POA: Diagnosis not present

## 2012-10-22 DIAGNOSIS — M545 Low back pain: Secondary | ICD-10-CM | POA: Diagnosis not present

## 2012-10-22 DIAGNOSIS — T07XXXA Unspecified multiple injuries, initial encounter: Secondary | ICD-10-CM | POA: Insufficient documentation

## 2012-10-22 DIAGNOSIS — S99919A Unspecified injury of unspecified ankle, initial encounter: Secondary | ICD-10-CM | POA: Diagnosis not present

## 2012-10-22 DIAGNOSIS — Y9289 Other specified places as the place of occurrence of the external cause: Secondary | ICD-10-CM | POA: Insufficient documentation

## 2012-10-22 DIAGNOSIS — M25559 Pain in unspecified hip: Secondary | ICD-10-CM | POA: Diagnosis not present

## 2012-10-22 DIAGNOSIS — W010XXA Fall on same level from slipping, tripping and stumbling without subsequent striking against object, initial encounter: Secondary | ICD-10-CM | POA: Insufficient documentation

## 2012-10-22 DIAGNOSIS — M25579 Pain in unspecified ankle and joints of unspecified foot: Secondary | ICD-10-CM | POA: Diagnosis not present

## 2012-10-22 DIAGNOSIS — Z8742 Personal history of other diseases of the female genital tract: Secondary | ICD-10-CM | POA: Diagnosis not present

## 2012-10-22 DIAGNOSIS — Z87891 Personal history of nicotine dependence: Secondary | ICD-10-CM | POA: Diagnosis not present

## 2012-10-22 DIAGNOSIS — W19XXXA Unspecified fall, initial encounter: Secondary | ICD-10-CM

## 2012-10-22 DIAGNOSIS — Y9301 Activity, walking, marching and hiking: Secondary | ICD-10-CM | POA: Insufficient documentation

## 2012-10-22 DIAGNOSIS — S4980XA Other specified injuries of shoulder and upper arm, unspecified arm, initial encounter: Secondary | ICD-10-CM | POA: Diagnosis not present

## 2012-10-22 MED ORDER — HYDROCODONE-ACETAMINOPHEN 5-325 MG PO TABS
1.0000 | ORAL_TABLET | Freq: Once | ORAL | Status: AC
  Administered 2012-10-22: 1 via ORAL
  Filled 2012-10-22: qty 1

## 2012-10-22 MED ORDER — HYDROCODONE-ACETAMINOPHEN 5-325 MG PO TABS: 1.0000 | ORAL_TABLET | Freq: Four times a day (QID) | ORAL | Status: DC | PRN

## 2012-10-22 NOTE — ED Notes (Signed)
Ortho tech at bedside 

## 2012-10-22 NOTE — ED Notes (Signed)
Per EMS pt was found in the street where she fell while walking her dog.  Pt c/o of left shoulder, left arm, bilat knees, left ankle pain.  Pt denies being on blood thinners.

## 2012-10-22 NOTE — ED Notes (Signed)
Pt states that her left knee gave out while walking her dog and she fell off the driveway into the road trying to catch herself with her left arm.

## 2012-10-22 NOTE — ED Notes (Signed)
[id number]<AV> Expected date:10/22/12<BR> Expected time: 2:23 PM<BR> Means of arrival:Ambulance<BR> Comments:<BR> 71yo/fall/LSB

## 2012-10-22 NOTE — ED Notes (Signed)
Patient transported to X-ray 

## 2012-10-22 NOTE — ED Provider Notes (Signed)
History    CSN: [id number] Arrival date & time 10/22/12  1447 First MD Initiated Contact with Patient 10/22/12 1535      Chief Complaint  Patient presents with  . Fall  . Shoulder Pain  . Arm Pain  . Back Pain    HPI Pt was walking down her driveway when her left ankle gave way and she tripped and stumbled falling onto the driveway and in the street.  Pt could not move her left shoulder after the fall.  Her right arm and legs seem to be moving fine.  She arrived via 911.  She is having pain in her head, left upper arm, bilateral knees and left ankle.  No LOC.  No vomiting or diarrhea.  No CP or SOB.  Past Medical History  Diagnosis Date  . Tobacco abuse   . Fibroid uterus   . History of D&C     x2  . Bruxism     Past Surgical History  Procedure Date  . Fibroid removed from uterus   . Cholecystectomy 2009  . Bce     Family History  Problem Relation Age of Onset  . Heart disease Mother   . Cancer Father     oral    History  Substance Use Topics  . Smoking status: Former Smoker    Types: Cigarettes    Quit date: 07/23/2008  . Smokeless tobacco: Not on file  . Alcohol Use: No    OB History    Grav Para Term Preterm Abortions TAB SAB Ect Mult Living                  Review of Systems  All other systems reviewed and are negative.    Allergies  Clarithromycin and Codeine  Home Medications   Current Outpatient Rx  Name  Route  Sig  Dispense  Refill  . ASPIRIN 81 MG PO TABS   Oral   Take 81 mg by mouth daily.           . B COMPLEX PO TABS   Oral   Take 1 tablet by mouth daily.           Marland Kitchen CALCIUM MAGNESIUM PO   Oral   Take 1 tablet by mouth daily.         Marland Kitchen METRONIDAZOLE 0.75 % EX GEL   Topical   Apply 1 application topically at bedtime. Applies to face for rosacea         . ONE-DAILY MULTI VITAMINS PO TABS   Oral   Take 1 tablet by mouth daily.           Marland Kitchen VITAMIN C 500 MG PO TABS   Oral   Take 500 mg by mouth daily.            Marland Kitchen VITAMIN E 400 UNITS PO CAPS   Oral   Take 400 Units by mouth daily.             BP 144/71  Pulse 83  Temp 98.3 F (36.8 C) (Oral)  Resp 18  Ht 4\' 11"  (1.499 m)  SpO2 92%  Physical Exam  Nursing note and vitals reviewed. Constitutional: She appears well-developed and well-nourished. No distress.  HENT:  Head: Normocephalic and atraumatic.  Right Ear: External ear normal.  Left Ear: External ear normal.  Eyes: Conjunctivae normal are normal. Right eye exhibits no discharge. Left eye exhibits no discharge. No scleral icterus.  Neck: Neck supple. No tracheal  deviation present.  Cardiovascular: Normal rate, regular rhythm and intact distal pulses.   Pulmonary/Chest: Effort normal and breath sounds normal. No stridor. No respiratory distress. She has no wheezes. She has no rales.  Abdominal: Soft. Bowel sounds are normal. She exhibits no distension. There is no tenderness. There is no rebound and no guarding.  Musculoskeletal: She exhibits no edema and no tenderness.       Left shoulder: She exhibits decreased range of motion and tenderness.       Right knee: She exhibits normal range of motion, no swelling, no effusion and no ecchymosis.       Left knee: She exhibits normal range of motion, no swelling, no effusion and no ecchymosis.       Left ankle: She exhibits decreased range of motion. She exhibits no swelling. tenderness.       Cervical back: Normal.       Thoracic back: Normal.       Lumbar back: She exhibits tenderness (paraspinal). She exhibits no bony tenderness.       Left upper arm: She exhibits tenderness and bony tenderness. She exhibits no swelling and no edema.       ttp compression pelvis  Neurological: She is alert. She has normal strength. She displays no atrophy. No sensory deficit. Cranial nerve deficit:  no gross defecits noted. She exhibits normal muscle tone. She displays no seizure activity. Coordination normal.  Skin: Skin is warm and dry. No rash  noted.  Psychiatric: She has a normal mood and affect.    ED Course  Procedures (including critical care time)  Labs Reviewed - No data to display Dg Lumbar Spine Complete  10/22/2012  *RADIOLOGY REPORT*  Clinical Data: Larey Seat today with pain  LUMBAR SPINE - COMPLETE 4+ VIEW  Comparison: None.  Findings: The lumbar vertebrae are in normal alignment.  No compression fracture is seen.  There is degenerative disc disease primarily at L4-5.  The SI joints appear corticated.  The bones are osteopenic.  IMPRESSION: Normal alignment.  No acute fracture.   Original Report Authenticated By: Dwyane Dee, M.D.    Dg Pelvis 1-2 Views  10/22/2012  *RADIOLOGY REPORT*  Clinical Data: Fall.  Bilateral hip pain.  PELVIS - 1-2 VIEW  Comparison: 07/10/2008.  Findings: Hip joint spaces appear normal and symmetric.  Obturator rings intact.  Pubic symphysial degenerative disease.  No displaced fracture is identified.  Sacral arcades appear within normal limits.  The capsule is present in the enteric stream in the right lower quadrant.  IMPRESSION: No acute osseous abnormality.   Original Report Authenticated By: Andreas Newport, M.D.    Dg Ankle Complete Left  10/22/2012  *RADIOLOGY REPORT*  Clinical Data: Larey Seat today with pain  LEFT ANKLE COMPLETE - 3+ VIEW  Comparison: None.  Findings: The ankle joint appears normal.  Alignment is normal.  No fracture is seen.  IMPRESSION: No fracture.   Original Report Authenticated By: Dwyane Dee, M.D.    Dg Shoulder Left  10/22/2012  *RADIOLOGY REPORT*  Clinical Data: Larey Seat today with pain  LEFT SHOULDER - 2+ VIEW  Comparison: None.  Findings: No acute fracture is seen.  The left humeral head is in normal position and the left glenohumeral joint space appears normal. The left AC joint is normally aligned.  IMPRESSION: No acute abnormality.   Original Report Authenticated By: Dwyane Dee, M.D.    Dg Humerus Left  10/22/2012  *RADIOLOGY REPORT*  Clinical Data: Recent fall with pain   LEFT  HUMERUS - 2+ VIEW  Comparison: None  Findings: The left humerus is intact.  No fracture is seen.  IMPRESSION: No acute fracture.   Original Report Authenticated By: Dwyane Dee, M.D.      1. Fall   2. Multiple contusions       MDM  Patient does not appear to have any significant injuries associated with her fall fortunately. I suspect she twisted her ankle and this was the cause for her falling she is able to move all her extremities without any difficulty now. She does not appear to be displaying any signs of weakness and she did not have syncope with this fall. Patient be provided a sling and a splint to help support her ankle and left arm. She'll be discharged home with a prescription for hydrocodone        Celene Kras, MD 10/22/12 1649

## 2012-10-25 DIAGNOSIS — L821 Other seborrheic keratosis: Secondary | ICD-10-CM | POA: Diagnosis not present

## 2012-10-25 DIAGNOSIS — L919 Hypertrophic disorder of the skin, unspecified: Secondary | ICD-10-CM | POA: Diagnosis not present

## 2012-10-25 DIAGNOSIS — L820 Inflamed seborrheic keratosis: Secondary | ICD-10-CM | POA: Diagnosis not present

## 2012-10-25 DIAGNOSIS — Z85828 Personal history of other malignant neoplasm of skin: Secondary | ICD-10-CM | POA: Diagnosis not present

## 2013-02-04 DIAGNOSIS — Z1231 Encounter for screening mammogram for malignant neoplasm of breast: Secondary | ICD-10-CM | POA: Diagnosis not present

## 2013-02-15 ENCOUNTER — Emergency Department (HOSPITAL_COMMUNITY): Payer: Medicare Other

## 2013-02-15 ENCOUNTER — Encounter (HOSPITAL_COMMUNITY): Payer: Self-pay | Admitting: *Deleted

## 2013-02-15 ENCOUNTER — Emergency Department (HOSPITAL_COMMUNITY)
Admission: EM | Admit: 2013-02-15 | Discharge: 2013-02-16 | Disposition: A | Discharge status: Home / Self Care | Payer: Medicare Other | Attending: Emergency Medicine | Admitting: Emergency Medicine

## 2013-02-15 DIAGNOSIS — Z7982 Long term (current) use of aspirin: Secondary | ICD-10-CM | POA: Diagnosis not present

## 2013-02-15 DIAGNOSIS — M545 Low back pain, unspecified: Secondary | ICD-10-CM

## 2013-02-15 DIAGNOSIS — K5730 Diverticulosis of large intestine without perforation or abscess without bleeding: Secondary | ICD-10-CM | POA: Diagnosis not present

## 2013-02-15 DIAGNOSIS — Z9889 Other specified postprocedural states: Secondary | ICD-10-CM | POA: Insufficient documentation

## 2013-02-15 DIAGNOSIS — Z79899 Other long term (current) drug therapy: Secondary | ICD-10-CM | POA: Insufficient documentation

## 2013-02-15 DIAGNOSIS — R1032 Left lower quadrant pain: Secondary | ICD-10-CM

## 2013-02-15 DIAGNOSIS — F411 Generalized anxiety disorder: Secondary | ICD-10-CM | POA: Diagnosis not present

## 2013-02-15 DIAGNOSIS — Z8742 Personal history of other diseases of the female genital tract: Secondary | ICD-10-CM | POA: Insufficient documentation

## 2013-02-15 DIAGNOSIS — Z87891 Personal history of nicotine dependence: Secondary | ICD-10-CM | POA: Diagnosis not present

## 2013-02-15 DIAGNOSIS — E669 Obesity, unspecified: Secondary | ICD-10-CM | POA: Insufficient documentation

## 2013-02-15 DIAGNOSIS — Z87828 Personal history of other (healed) physical injury and trauma: Secondary | ICD-10-CM | POA: Diagnosis not present

## 2013-02-15 DIAGNOSIS — Z9089 Acquired absence of other organs: Secondary | ICD-10-CM | POA: Diagnosis not present

## 2013-02-15 DIAGNOSIS — I700 Atherosclerosis of aorta: Secondary | ICD-10-CM | POA: Diagnosis not present

## 2013-02-15 DIAGNOSIS — Z8659 Personal history of other mental and behavioral disorders: Secondary | ICD-10-CM | POA: Diagnosis not present

## 2013-02-15 LAB — URINALYSIS, ROUTINE W REFLEX MICROSCOPIC
Bilirubin Urine: NEGATIVE
Bilirubin Urine: NEGATIVE
Glucose, UA: NEGATIVE mg/dL
Glucose, UA: NEGATIVE mg/dL
Ketones, ur: NEGATIVE mg/dL
Ketones, ur: NEGATIVE mg/dL
Leukocytes, UA: NEGATIVE
Nitrite: NEGATIVE
Nitrite: NEGATIVE
Protein, ur: NEGATIVE mg/dL
Protein, ur: NEGATIVE mg/dL
Specific Gravity, Urine: 1.021 (ref 1.005–1.030)
Specific Gravity, Urine: 1.028 (ref 1.005–1.030)
Urobilinogen, UA: 0.2 mg/dL (ref 0.0–1.0)
Urobilinogen, UA: 0.2 mg/dL (ref 0.0–1.0)
pH: 6.5 (ref 5.0–8.0)
pH: 6.5 (ref 5.0–8.0)

## 2013-02-15 LAB — CBC
HCT: 42.8 % (ref 36.0–46.0)
Hemoglobin: 14.6 g/dL (ref 12.0–15.0)
MCH: 29.1 pg (ref 26.0–34.0)
MCHC: 34.1 g/dL (ref 30.0–36.0)
MCV: 85.4 fL (ref 78.0–100.0)
Platelets: 240 10*3/uL (ref 150–400)
RBC: 5.01 MIL/uL (ref 3.87–5.11)
RDW: 14.7 % (ref 11.5–15.5)
WBC: 15.6 10*3/uL — ABNORMAL HIGH (ref 4.0–10.5)

## 2013-02-15 LAB — BASIC METABOLIC PANEL
BUN: 16 mg/dL (ref 6–23)
CO2: 22 mEq/L (ref 19–32)
Calcium: 9.2 mg/dL (ref 8.4–10.5)
Chloride: 103 mEq/L (ref 96–112)
Creatinine, Ser: 0.75 mg/dL (ref 0.50–1.10)
GFR calc Af Amer: >90 mL/min (ref >90)
GFR calc non Af Amer: 83 mL/min — ABNORMAL LOW (ref >90)
Glucose, Bld: 103 mg/dL — ABNORMAL HIGH (ref 70–99)
Potassium: 3.7 mEq/L (ref 3.5–5.1)
Sodium: 137 mEq/L (ref 135–145)

## 2013-02-15 LAB — URINE MICROSCOPIC-ADD ON

## 2013-02-15 MED ORDER — MORPHINE SULFATE 4 MG/ML IJ SOLN
4.0000 mg | Freq: Once | INTRAMUSCULAR | Status: AC
  Administered 2013-02-15: 4 mg via INTRAVENOUS
  Filled 2013-02-15: qty 1

## 2013-02-15 MED ORDER — IOHEXOL 300 MG/ML  SOLN
50.0000 mL | Freq: Once | INTRAMUSCULAR | Status: AC | PRN
  Administered 2013-02-15: 50 mL via ORAL

## 2013-02-15 MED ORDER — IOHEXOL 300 MG/ML  SOLN
100.0000 mL | Freq: Once | INTRAMUSCULAR | Status: AC | PRN
  Administered 2013-02-15: 100 mL via INTRAVENOUS

## 2013-02-15 MED ORDER — MORPHINE SULFATE 4 MG/ML IJ SOLN
4.0000 mg | Freq: Once | INTRAMUSCULAR | Status: AC
  Administered 2013-02-16: 4 mg via INTRAVENOUS
  Filled 2013-02-15: qty 1

## 2013-02-15 MED ORDER — IBUPROFEN 800 MG PO TABS
800.0000 mg | ORAL_TABLET | Freq: Once | ORAL | Status: AC
Start: 2013-02-15 — End: 2013-02-15
  Administered 2013-02-15: 800 mg via ORAL
  Filled 2013-02-15: qty 1

## 2013-02-15 NOTE — ED Provider Notes (Signed)
History     CSN: [id number]  Arrival date & time 02/15/13  1911   First MD Initiated Contact with Patient 02/15/13 1954      Chief Complaint  Patient presents with  . Back Pain    (Consider location/radiation/quality/duration/timing/severity/associated sxs/prior treatment) Patient is a 72 y.o. female presenting with back pain. The history is provided by the patient. No language interpreter was used.  Back Pain Associated symptoms: no chest pain and no fever   Pt is a 72yo female with hx of ovarian cysts c/o LLQ pain associated with left LBP.  States she believed an ovarian cyst on her left side ruptured 2wks ago.  Since then she had waxing and waning LLQ pain, sharp & cramping.  As of yesterday she has experienced left LBP that is sharp in nature and radiates to left thigh. Pain has been constant and moderate to severe in nature.  Ibuprofen did relief the pain yesterday but today pain started again.  Pt has taken vicodin in the past, most recently for fall in December when she landed on her left side.  States she is out of Vicodin but believes that would help her current pain.  Reports feeling otherwise healthy up until yesterday's back pain.  Denies fever, n/v/d.  Denies hx of sciatica however states she was tx by Dr. Annitta Jersey, orthopedist, 40yrs ago with prednisone taper for left LBP that radiated into left thigh.  Believes this pain is not the same.  Also reports small drop of blood in underwear yesterday but believes it came from urinary tract, not vagina.  Denies dysuria or hx of kidney stones.    Past Medical History  Diagnosis Date  . Tobacco abuse   . Fibroid uterus   . History of D&C     x2  . Bruxism     Past Surgical History  Procedure Laterality Date  . Fibroid removed from uterus    . Cholecystectomy  2009  . Bce      Family History  Problem Relation Age of Onset  . Heart disease Mother   . Cancer Father     oral    History  Substance Use Topics  . Smoking  status: Former Smoker    Types: Cigarettes    Quit date: 07/23/2008  . Smokeless tobacco: Not on file  . Alcohol Use: No    OB History   Grav Para Term Preterm Abortions TAB SAB Ect Mult Living                  Review of Systems  Constitutional: Negative for fever and chills.  Respiratory: Negative for chest tightness and shortness of breath.   Cardiovascular: Negative for chest pain.  Gastrointestinal: Negative for nausea, vomiting and diarrhea.  Musculoskeletal: Positive for back pain.    Allergies  Biaxin and Codeine  Home Medications   Current Outpatient Rx  Name  Route  Sig  Dispense  Refill  . aspirin 81 MG tablet   Oral   Take 81 mg by mouth daily.           Marland Kitchen b complex vitamins tablet   Oral   Take 1 tablet by mouth daily.           . Calcium-Magnesium-Vitamin D (CALCIUM MAGNESIUM PO)   Oral   Take 1 tablet by mouth daily.         Marland Kitchen ibuprofen (ADVIL,MOTRIN) 200 MG tablet   Oral   Take 400 mg by mouth every  6 (six) hours as needed for pain.         . metroNIDAZOLE (METROGEL) 0.75 % gel   Topical   Apply 1 application topically at bedtime. Applies to face for rosacea         . Multiple Vitamin (MULTIVITAMIN) tablet   Oral   Take 1 tablet by mouth daily.           . naproxen sodium (ANAPROX) 220 MG tablet   Oral   Take 220 mg by mouth 2 (two) times daily as needed.         Marland Kitchen OVER THE COUNTER MEDICATION   Oral   Take 1 tablet by mouth daily. Osteo matrix         . pyridOXINE (VITAMIN B-6) 100 MG tablet   Oral   Take 100 mg by mouth daily.         . vitamin B-12 (CYANOCOBALAMIN) 1000 MCG tablet   Oral   Take 1,000 mcg by mouth daily.         . vitamin C (ASCORBIC ACID) 500 MG tablet   Oral   Take 500 mg by mouth daily.           . vitamin E (VITAMIN E) 400 UNIT capsule   Oral   Take 400 Units by mouth daily.           Marland Kitchen HYDROcodone-acetaminophen (NORCO/VICODIN) 5-325 MG per tablet      Take 1-2tabs every 6hrs as  needed for pain   10 tablet   0     BP 150/95  Pulse 85  Temp(Src) 99.2 F (37.3 C) (Oral)  Resp 16  SpO2 94%  Physical Exam  Constitutional: She appears well-developed and well-nourished. No distress.  Obese female lying on exam bed, appears to be shivering.  Increased respirations during H&P.  HENT:  Head: Normocephalic and atraumatic.  Eyes: Conjunctivae are normal. No scleral icterus.  Neck: Normal range of motion. Neck supple. No JVD present. No tracheal deviation present. No thyromegaly present.  Cardiovascular: Normal rate, regular rhythm and normal heart sounds.   Pulmonary/Chest: Breath sounds normal. No stridor. No respiratory distress. She has no wheezes. She has no rales. She exhibits no tenderness.  Tachypnea   Abdominal: Soft. Bowel sounds are normal. She exhibits no distension and no mass. There is tenderness ( moderate-severe LLQ ). There is no rebound and no guarding.  Genitourinary: Vagina normal and uterus normal. There is rash ( mild erythema) on the right labia. There is no tenderness, lesion or injury on the right labia. There is rash ( mild erythema) on the left labia. There is no tenderness, lesion or injury on the left labia. No erythema, tenderness or bleeding around the vagina. No foreign body around the vagina. No signs of injury around the vagina. No vaginal discharge found.  Chaperone was present.  Bimanual: no cervical motion tenderness, or adnexal masses. No bleeding or discharge.   Musculoskeletal: Normal range of motion.  Lymphadenopathy:    She has no cervical adenopathy.  Neurological: She is alert.  Skin: Skin is warm and dry. She is not diaphoretic.  Psychiatric: Her mood appears anxious.    ED Course  Procedures (including critical care time)  Labs Reviewed  CBC - Abnormal; Notable for the following:    WBC 15.6 (*)    All other components within normal limits  URINALYSIS, ROUTINE W REFLEX MICROSCOPIC - Abnormal; Notable for the  following:    Hgb urine dipstick MODERATE (*)  Leukocytes, UA MODERATE (*)    All other components within normal limits  BASIC METABOLIC PANEL - Abnormal; Notable for the following:    Glucose, Bld 103 (*)    GFR calc non Af Amer 83 (*)    All other components within normal limits  URINALYSIS, ROUTINE W REFLEX MICROSCOPIC - Abnormal; Notable for the following:    Hgb urine dipstick TRACE (*)    All other components within normal limits  URINE MICROSCOPIC-ADD ON  URINE MICROSCOPIC-ADD ON   Ct Abdomen Pelvis W Contrast  02/15/2013  *RADIOLOGY REPORT*  Clinical Data: Lower back pain on the left side, radiating down the leg.  CT ABDOMEN AND PELVIS WITH CONTRAST  Technique:  Multidetector CT imaging of the abdomen and pelvis was performed following the standard protocol during bolus administration of intravenous contrast.  Contrast: 100 mL of Omnipaque 300 IV contrast  Comparison: MRA of the abdomen performed 07/10/2008, and abdominal ultrasound performed 07/09/2008  Findings: Minimal right basilar atelectasis is noted.  Two small pulmonary nodules are noted within the right middle lobe, measuring 4 mm and 3 mm (images 3 and 4 of 22).  Though these are most likely post infectious or inflammatory in nature, follow-up would be warranted.  The liver and spleen are unremarkable in appearance.  Previously noted nonocclusive thrombus within the middle hepatic vein is no longer seen.  The patient is status post cholecystectomy, with clips noted along the gallbladder fossa.  The pancreas and adrenal glands are unremarkable.  The kidneys are unremarkable in appearance.  There is no evidence of hydronephrosis.  No renal or ureteral stones are seen.  No perinephric stranding is appreciated.  Small bilateral extrarenal pelves are seen.  No free fluid is identified.  The small bowel is unremarkable in appearance.  The stomach is within normal limits.  No acute vascular abnormalities are seen.  Scattered calcification  is noted along the abdominal aorta and its branches.  The appendix is not carefully seen; there is no evidence for appendicitis.  Contrast passes to the level of the cecum. Scattered diverticulosis is noted along the descending and sigmoid colon, without evidence of diverticulitis.  Trace free fluid along the sigmoid colon, adjacent to the uterus, is thought to be physiologic in nature, given the lack of soft tissue inflammation.  The bladder is mildly distended and grossly unremarkable.  The uterus is grossly unremarkable in appearance.  The ovaries are grossly symmetric, though not well assessed.  No suspicious adnexal masses are seen.  No inguinal lymphadenopathy is seen.  No acute osseous abnormalities are identified.  Vacuum phenomenon is noted at multiple levels along the lumbar spine.  IMPRESSION:  1.  No acute abnormality seen to explain the patient's symptoms. 2.  Scattered diverticulosis along the descending sigmoid colon, without definite evidence of diverticulitis.  Trace free fluid along the sigmoid colon and adjacent to the uterus is thought to be physiologic in nature, given the lack of soft tissue inflammation. 3.  Scattered calcification along the abdominal aorta and its branches. 4.  Two small pulmonary nodules within the right middle lobe, measuring 4 mm and 3 mm.  If the patient is at high risk for bronchogenic carcinoma, follow-up chest CT at 1 year is recommended.  If the patient is at low risk, no follow-up is needed.  This recommendation follows the consensus statement: Guidelines for Management of Small Pulmonary Nodules Detected on CT Scans:  A Statement from the Fleischner Society as published in Radiology 2005; 237:395-400.  Original Report Authenticated By: Tonia Ghent, M.D.      1. LBP (low back pain)   2. LLQ pain       MDM  Pt c/o left sided LBP and LLQ pain x2 days.  States pain in LLQ started 2wks ago and would wax and wane but current pain intensified and became  constant as of 2days ago.  Pt became increasingly anxious during H&P.  Will tx pain via morphine before completing H&P.  Will obtain labs and CT abdomen.   Pt states pain has improved since receiving morphine.  Reports blood in urine and on sanitary pad.  Obtained I&O and repeat UA.  CT: no acute abnormality seen to explain pt's symptoms.  No evidence of diverticulitis or ruptured aortic aneurism.  CT did show 2 small pulmonary nodules.  Pt has hx of smoking, has since stopped but will have pt obtain f/u CT in 28yr.     Repeat UA showed 0-2 WBC and 0-2 RBC.  Will tx pt for LBP and have pt keep GYN appointment on Friday for routine pap.    Will discharge pt home and tx for LBP.  Low concern for emergent process taking place at this time.  No further workup or intervention needed at this time.   Rx: norco.  May take OTC ibuprofen.   Provided pt contact info for [facility] for PCP referral.  Pt able to ambulate without assistance prior to discharge.   Vitals: unremarkable. Discharged in stable condition.    Discussed pt with attending during ED encounter.        Junius Finner, PA-C 02/16/13 0003  Junius Finner, PA-C 02/16/13 0004

## 2013-02-15 NOTE — ED Notes (Signed)
Pt c/o low back pain on L side radiating down leg. Pt states pain started yesterday and was relieved by ibuprofen and began again today. Pt states she has been taking vicodin off and on for two years since a fall occurring x 2 yrs ago. Pt state fall in Dec. For which she was rx'd vicodin. Pt states she no longer has any vicodin but feels as if she needs some for her recent back pain. Pt denies recent fall and injury and denies hx of sciatica.

## 2013-02-15 NOTE — ED Provider Notes (Signed)
Complains of left lower quadrant pain and left-sided low back pain onset 2 days ago. No fever no other complaint on exam patient mildly anxious abdomen obese normoactive bowel sounds tender at left lower quadrant back without point tenderness or flank tenderness.  Doug Sou, MD 02/15/13 2108

## 2013-02-15 NOTE — ED Notes (Signed)
Pt presents with Left lower back pain onset on yesterday, progressed today. States she had a cyst to rupture on lt ovary last week and now has pain in back. She also c/o numbness in lt . Thigh.

## 2013-02-16 MED ORDER — HYDROCODONE-ACETAMINOPHEN 5-325 MG PO TABS: ORAL_TABLET | ORAL | Status: DC

## 2013-02-16 NOTE — ED Provider Notes (Signed)
Medical screening examination/treatment/procedure(s) were conducted as a shared visit with non-physician practitioner(s) and myself.  I personally evaluated the patient during the encounter  Doug Sou, MD 02/16/13 [phone]

## 2013-02-18 ENCOUNTER — Encounter (HOSPITAL_COMMUNITY): Payer: Self-pay

## 2013-02-18 ENCOUNTER — Inpatient Hospital Stay (HOSPITAL_COMMUNITY): Payer: Medicare Other

## 2013-02-18 ENCOUNTER — Inpatient Hospital Stay (HOSPITAL_COMMUNITY)
Admission: AD | Admit: 2013-02-18 | Discharge: 2013-02-18 | Disposition: A | Discharge status: Home / Self Care | Payer: Medicare Other | Source: Ambulatory Visit | Attending: Obstetrics & Gynecology | Admitting: Obstetrics & Gynecology

## 2013-02-18 DIAGNOSIS — K5730 Diverticulosis of large intestine without perforation or abscess without bleeding: Secondary | ICD-10-CM | POA: Insufficient documentation

## 2013-02-18 DIAGNOSIS — R1032 Left lower quadrant pain: Secondary | ICD-10-CM | POA: Diagnosis not present

## 2013-02-18 DIAGNOSIS — M549 Dorsalgia, unspecified: Secondary | ICD-10-CM | POA: Insufficient documentation

## 2013-02-18 DIAGNOSIS — R319 Hematuria, unspecified: Secondary | ICD-10-CM | POA: Insufficient documentation

## 2013-02-18 DIAGNOSIS — R109 Unspecified abdominal pain: Secondary | ICD-10-CM | POA: Diagnosis not present

## 2013-02-18 LAB — URINALYSIS, ROUTINE W REFLEX MICROSCOPIC
Nitrite: NEGATIVE
Specific Gravity, Urine: 1.025 (ref 1.005–1.030)
Urobilinogen, UA: 0.2 mg/dL (ref 0.0–1.0)
pH: 6 (ref 5.0–8.0)

## 2013-02-18 LAB — URINE MICROSCOPIC-ADD ON

## 2013-02-18 MED ORDER — HYDROCODONE-ACETAMINOPHEN 5-325 MG PO TABS: 2.0000 | ORAL_TABLET | ORAL | Status: DC | PRN

## 2013-02-18 MED ORDER — FLUCONAZOLE 150 MG PO TABS: 150.0000 mg | ORAL_TABLET | Freq: Once | ORAL | Status: DC

## 2013-02-18 MED ORDER — CIPROFLOXACIN HCL 500 MG PO TABS: 500.0000 mg | ORAL_TABLET | Freq: Two times a day (BID) | ORAL | Status: DC

## 2013-02-18 MED ORDER — HYDROCODONE-ACETAMINOPHEN 5-325 MG PO TABS
1.0000 | ORAL_TABLET | Freq: Once | ORAL | Status: AC
  Administered 2013-02-18: 1 via ORAL
  Filled 2013-02-18: qty 1

## 2013-02-18 NOTE — MAU Note (Signed)
Pain 8/10 with movement

## 2013-02-18 NOTE — MAU Provider Note (Signed)
History     CSN: [id number]  Arrival date and time: 02/18/13 1036   First Provider Initiated Contact with Patient 02/18/13 1140      Chief Complaint  Patient presents with  . Abdominal Pain  . Hematuria  . Back Pain   HPI [name] is a 72 y.o. [phone] female who presents to MAU today with complaint of LLQ pain, back pain and hematuria. The patient was seen and evaluated with CT scan at [facility] on 02/15/13 which showed no GYN origin for pain. The patient states that her symptoms have persisted and that she is having some irritative voiding symptoms along with this pain that all started last Thursday. She was given vicodin which helps somewhat for her pain. She continues to have hematuria as well. She denies fever. She had one episode of nausea early this morning without vomiting. She has incontinence of her bowels often since her gallbladder surgery in 2009. She has not followed up with her PCP recently because she "doesn't like him, because he never treats her for anything."   OB History   Grav Para Term Preterm Abortions TAB SAB Ect Mult Living   2 2 2       2       Past Medical History  Diagnosis Date  . Tobacco abuse   . Fibroid uterus   . History of D&C     x2  . Bruxism     Past Surgical History  Procedure Laterality Date  . Fibroid removed from uterus    . Cholecystectomy  2009  . Bce    . Dilation and curettage of uterus      Family History  Problem Relation Age of Onset  . Heart disease Mother   . Cancer Father     oral    History  Substance Use Topics  . Smoking status: Former Smoker    Types: Cigarettes    Quit date: 07/23/2008  . Smokeless tobacco: Never Used  . Alcohol Use: No    Allergies:  Allergies  Allergen Reactions  . Biaxin (Clarithromycin)     REACTION: BREASTS INFLAMMED  . Codeine     REACTION: PANIC ATTACKS, CAN'T SLEEP    Prescriptions prior to admission  Medication Sig Dispense Refill  . aspirin 81 MG tablet Take 81 mg by  mouth daily.        Marland Kitchen b complex vitamins tablet Take 1 tablet by mouth daily.        . Calcium-Magnesium-Vitamin D (CALCIUM MAGNESIUM PO) Take 1 tablet by mouth daily.      Marland Kitchen ibuprofen (ADVIL,MOTRIN) 200 MG tablet Take 400 mg by mouth every 6 (six) hours as needed for pain.      . metroNIDAZOLE (METROGEL) 0.75 % gel Apply 1 application topically at bedtime. Applies to face for rosacea      . Multiple Vitamin (MULTIVITAMIN) tablet Take 1 tablet by mouth daily.        Marland Kitchen OVER THE COUNTER MEDICATION Take 1 tablet by mouth daily. Osteo matrix      . pyridOXINE (VITAMIN B-6) 100 MG tablet Take 100 mg by mouth daily.      . vitamin B-12 (CYANOCOBALAMIN) 1000 MCG tablet Take 1,000 mcg by mouth daily.      . vitamin C (ASCORBIC ACID) 500 MG tablet Take 500 mg by mouth daily.        . vitamin E (VITAMIN E) 400 UNIT capsule Take 400 Units by mouth daily.  Review of Systems  Constitutional: Negative for fever, chills and malaise/fatigue.  Gastrointestinal: Positive for nausea, abdominal pain and diarrhea. Negative for vomiting, constipation and blood in stool.  Genitourinary: Positive for dysuria and hematuria. Negative for urgency, frequency and flank pain.        Neg - vaginal bleeding or discharge  Musculoskeletal: Positive for back pain.  Neurological: Negative for dizziness.   Physical Exam   Blood pressure 115/68, pulse 84, resp. rate 20, SpO2 97.00%.  Physical Exam  Constitutional: She is oriented to person, place, and time. She appears well-developed and well-nourished. No distress.  HENT:  Head: Normocephalic and atraumatic.  Cardiovascular: Normal rate, regular rhythm and normal heart sounds.   Respiratory: Effort normal and breath sounds normal. No respiratory distress.  GI: Soft. Bowel sounds are normal. She exhibits no distension and no mass. There is tenderness (moderate tenderness of the LLQ to palpation). There is no rebound and no guarding.  Genitourinary: There is no  rash or tenderness on the right labia. There is no rash or tenderness on the left labia.  Neurological: She is alert and oriented to person, place, and time.  Skin: Skin is warm and dry. No erythema.  Psychiatric: She has a normal mood and affect.   Results for orders placed during the hospital encounter of 02/18/13 (from the past 24 hour(s))  URINALYSIS, ROUTINE W REFLEX MICROSCOPIC     Status: Abnormal   Collection Time    02/18/13 10:55 AM      Result Value Range   Color, Urine YELLOW  YELLOW   APPearance CLOUDY (*) CLEAR   Specific Gravity, Urine 1.025  1.005 - 1.030   pH 6.0  5.0 - 8.0   Glucose, UA NEGATIVE  NEGATIVE mg/dL   Hgb urine dipstick LARGE (*) NEGATIVE   Bilirubin Urine SMALL (*) NEGATIVE   Ketones, ur 40 (*) NEGATIVE mg/dL   Protein, ur 30 (*) NEGATIVE mg/dL   Urobilinogen, UA 0.2  0.0 - 1.0 mg/dL   Nitrite NEGATIVE  NEGATIVE   Leukocytes, UA MODERATE (*) NEGATIVE  URINE MICROSCOPIC-ADD ON     Status: Abnormal   Collection Time    02/18/13 10:55 AM      Result Value Range   Squamous Epithelial / LPF FEW (*) RARE   WBC, UA 21-50  <3 WBC/hpf   RBC / HPF 7-10  <3 RBC/hpf   Bacteria, UA FEW (*) RARE    US Transvaginal Non-ob  02/18/2013  *RADIOLOGY REPORT*  Clinical Data: Left pelvic and lower quadrant pain. Post menopausal female.  TRANSABDOMINAL AND TRANSVAGINAL ULTRASOUND OF PELVIS  Technique:  Both transabdominal and transvaginal ultrasound examinations of the pelvis were performed.  Transabdominal technique was performed for global imaging of the pelvis including uterus, ovaries, adnexal regions, and pelvic cul-de-sac.  It was necessary to proceed with endovaginal exam following the transabdominal exam to visualize the endometrium and ovaries.  Comparison:  None.  Findings: Uterus:  6.3 x 3.8 x 6.4 cm.  Retroverted.  Heterogeneous echogenicity of the uterine myometrium is noted predominately in the anterior wall, where there are scatteredfoci of calcification. No  discrete masses are seen, and these findings may be secondary to adenomyosis or small fibroids.  Endometrium: Thin endometrium is seen which is displaced posteriorly and measures 2 mm in thickness transvaginally.  Right ovary: not directly visualized by transabdominal or transvaginal sonography, however no adnexal mass identified.  Left ovary: not directly visualized by transabdominal or transvaginal sonography, however no adnexal mass identified.  Other Findings:  No free fluid  IMPRESSION:  1.  Ill-defined heterogeneous echogenicity with focal calcifications in the anterior uterine myometrium.  This could be due to adenomyosis or small fibroids.  Consider pelvis MRI without with contrast for further evaluation if clinically warranted. 2.  Nonvisualization of the ovaries, however no adnexal mass or free fluid identified.   Original Report Authenticated By: Myles Rosenthal, M.D.    US Pelvis Complete  02/18/2013  *RADIOLOGY REPORT*  Clinical Data: Left pelvic and lower quadrant pain. Post menopausal female.  TRANSABDOMINAL AND TRANSVAGINAL ULTRASOUND OF PELVIS  Technique:  Both transabdominal and transvaginal ultrasound examinations of the pelvis were performed.  Transabdominal technique was performed for global imaging of the pelvis including uterus, ovaries, adnexal regions, and pelvic cul-de-sac.  It was necessary to proceed with endovaginal exam following the transabdominal exam to visualize the endometrium and ovaries.  Comparison:  None.  Findings: Uterus:  6.3 x 3.8 x 6.4 cm.  Retroverted.  Heterogeneous echogenicity of the uterine myometrium is noted predominately in the anterior wall, where there are scatteredfoci of calcification. No discrete masses are seen, and these findings may be secondary to adenomyosis or small fibroids.  Endometrium: Thin endometrium is seen which is displaced posteriorly and measures 2 mm in thickness transvaginally.  Right ovary: not directly visualized by transabdominal or  transvaginal sonography, however no adnexal mass identified.  Left ovary: not directly visualized by transabdominal or transvaginal sonography, however no adnexal mass identified.  Other Findings:  No free fluid  IMPRESSION:  1.  Ill-defined heterogeneous echogenicity with focal calcifications in the anterior uterine myometrium.  This could be due to adenomyosis or small fibroids.  Consider pelvis MRI without with contrast for further evaluation if clinically warranted. 2.  Nonvisualization of the ovaries, however no adnexal mass or free fluid identified.   Original Report Authenticated By: Myles Rosenthal, M.D.     MAU Course  Procedures None  MDM Discussed with Dr. Arlyce Dice. OK to get pelvic US to rule out GYN origin. If patient is stable can follow-up with Dr. Tenny Craw for routine health maintenance in the office as scheduled and PCP for other acute issues.   Assessment and Plan  A: Possible small uterine fibroid Diverticulosis with possible diverticulitis Irritative voiding symptoms  P: Discharge home Rx for vicodin, Cipro, Diflucan given to patient Patient to follow-up with Dr. Tenny Craw as scheduled Patient encouraged to follow-up with PCP or other primary care provider for other symptom management Patient may return to MAU as needed or if her condition were to change or worsen  Freddi Starr, PA-C   02/18/2013, 11:40 AM

## 2013-02-18 NOTE — Progress Notes (Signed)
Written and verbal d/c instructions given and understanding voiced. 

## 2013-02-18 NOTE — MAU Note (Signed)
Pt states left sided lower abdominal pain is constant, has intermittent back pain on same side.

## 2013-02-18 NOTE — MAU Note (Signed)
Patient states she was seen at [facility] on 4-19 for back and abdominal pain. Patient states she has continued to have slight back pain but having a lot of pain in the left side. States she has some bleeding in her urine.

## 2013-02-20 LAB — URINE CULTURE

## 2013-02-25 ENCOUNTER — Telehealth: Payer: Self-pay | Admitting: Gastroenterology

## 2013-02-25 ENCOUNTER — Encounter: Payer: Self-pay | Admitting: Gastroenterology

## 2013-02-25 NOTE — Telephone Encounter (Signed)
We discussed diverticulosis vs diverticulitis and that I will mail her foods brochures and something on diverticulosis. Offered her an earlier appt, but she is better. She did receive Cipro for a UTI and informed her we sometimes use that to tx diverticulitis. Pt stated understanding.

## 2013-02-26 ENCOUNTER — Ambulatory Visit: Payer: Medicare Other | Admitting: Family Medicine

## 2013-02-26 NOTE — Telephone Encounter (Signed)
Late entry to 02/25/13 call   Pt has an appt on 03/25/13 when she returns from the beach.

## 2013-02-27 ENCOUNTER — Telehealth: Payer: Self-pay | Admitting: Gastroenterology

## 2013-02-27 NOTE — Telephone Encounter (Signed)
Line busy

## 2013-02-28 NOTE — Telephone Encounter (Signed)
Pt and I discussed a high fiber diet and foods to eat and avoid. She has not received the literature I mailed, so I mailed again. Pt stated understanding.

## 2013-03-12 ENCOUNTER — Institutional Professional Consult (permissible substitution): Payer: Medicare Other | Admitting: Emergency Medicine

## 2013-03-19 ENCOUNTER — Encounter: Payer: Self-pay | Admitting: *Deleted

## 2013-03-25 ENCOUNTER — Encounter: Payer: Self-pay | Admitting: Gastroenterology

## 2013-03-25 ENCOUNTER — Ambulatory Visit (INDEPENDENT_AMBULATORY_CARE_PROVIDER_SITE_OTHER): Payer: Medicare Other | Admitting: Gastroenterology

## 2013-03-25 VITALS — BP 124/70 | HR 76 | Ht 59.0 in | Wt 145.2 lb

## 2013-03-25 DIAGNOSIS — Z9889 Other specified postprocedural states: Secondary | ICD-10-CM | POA: Diagnosis not present

## 2013-03-25 DIAGNOSIS — Z9049 Acquired absence of other specified parts of digestive tract: Secondary | ICD-10-CM

## 2013-03-25 DIAGNOSIS — K5730 Diverticulosis of large intestine without perforation or abscess without bleeding: Secondary | ICD-10-CM

## 2013-03-25 DIAGNOSIS — R1032 Left lower quadrant pain: Secondary | ICD-10-CM | POA: Diagnosis not present

## 2013-03-25 NOTE — Progress Notes (Signed)
History of Present Illness:  This is a 72 year old Caucasian female recently admitted for diverticulitis at [facility].  CT scan will 2 days before admission at [facility] long hospital was unremarkable except for diverticulosis without evidence of diverticulitis.  In any case, the patient seemed to respond IV antibiotics, and is currently asymptomatic without abdominal pain or bowel or regularity.  Her appetite is good her weight is stable.  She denies melena or hematochezia, upper GI or hepatobiliary complaints.  Last colonoscopy was 7 years ago and showed diverticulosis.  Family history is noncontributory.  I have reviewed this patient's present history, medical and surgical past history, allergies and medications.     ROS:   All systems were reviewed and are negative unless otherwise stated in the HPI.    Physical Exam: Blood pressure 124/70, pulse 76 and regular and weight 145 the BMI of 29.32. General well developed well nourished patient in no acute distress, appearing their stated age Eyes PERRLA, no icterus, fundoscopic exam per opthamologist Skin no lesions noted Neck supple, no adenopathy, no thyroid enlargement, no tenderness Chest clear to percussion and auscultation Heart no significant murmurs, gallops or rubs noted Abdomen no hepatosplenomegaly masses or tenderness, BS normal.  Rectal inspection normal no fissures, or fistulae noted.  No masses or tenderness on digital exam. Stool guaiac negative. Extremities no acute joint lesions, edema, phlebitis or evidence of cellulitis. Neurologic patient oriented x 3, cranial nerves intact, no focal neurologic deficits noted. Psychological mental status normal and normal affect.  Assessment and plan: Prior colonoscopy showed diverticulosis and mixed hemorrhoids.  The patient is currently asymptomatic, and I've advised her to use Benefiber 1 tablespoon twice a day with a high fiber diet and liberal by mouth fluids.  She saw our patient  education video on diverticulitis and its management.  He is refused followup colonoscopy.  She is to take her other medications as per her other physicians.  She is status post laparoscopic cholecystectomy and appendectomy.

## 2013-03-25 NOTE — Patient Instructions (Addendum)
It has been recommended to you by your physician that you have a(n) Colonoscopy completed. Per your request, we did not schedule the procedure(s) today. Please contact our office at [phone] should you decide to have the procedure completed.  You watched a video today on Diverticulosis and information is below.  Please use Benefiber as directed. Fiber Supplement sheet given today. ____________________________________________________________________________________________________                                               We are excited to introduce MyChart, a new best-in-class service that provides you online access to important information in your electronic medical record. We want to make it easier for you to view your health information - all in one secure location - when and where you need it. We expect MyChart will enhance the quality of care and service we provide.  When you register for MyChart, you can:    View your test results.    Request appointments and receive appointment reminders via email.    Request medication renewals.    View your medical history, allergies, medications and immunizations.    Communicate with your physician's office through a password-protected site.    Conveniently print information such as your medication lists.  To find out if MyChart is right for you, please talk to a member of our clinical staff today. We will gladly answer your questions about this free health and wellness tool.  If you are age 98 or older and want a member of your family to have access to your record, you must provide written consent by completing a proxy form available at our office. Please speak to our clinical staff about guidelines regarding accounts for patients younger than age 11.  As you activate your MyChart account and need any technical assistance, please call the MyChart technical support line at [phone]) or email your question to  [email or web address]. If you email your question(s), please include your name, a return phone number and the best time to reach you.  If you have non-urgent health-related questions, you can send a message to our office through MyChart at [email or web address]. If you have a medical emergency, call 911.  Thank you for using MyChart as your new health and wellness resource!   MyChart licensed from [employer],  9562-1308. Patents Pending.   _________________________________________________________________________________________________________________________________  Diverticulosis Diverticulosis is a common condition that develops when small pouches (diverticula) form in the wall of the colon. The risk of diverticulosis increases with age. It happens more often in people who eat a low-fiber diet. Most individuals with diverticulosis have no symptoms. Those individuals with symptoms usually experience abdominal pain, constipation, or loose stools (diarrhea). HOME CARE INSTRUCTIONS   Increase the amount of fiber in your diet as directed by your caregiver or dietician. This may reduce symptoms of diverticulosis.  Your caregiver may recommend taking a dietary fiber supplement.  Drink at least 6 to 8 glasses of water each day to prevent constipation.  Try not to strain when you have a bowel movement.  Your caregiver may recommend avoiding nuts and seeds to prevent complications, although this is still an uncertain benefit.  Only take over-the-counter or prescription medicines for pain, discomfort, or fever as directed by your caregiver. FOODS WITH HIGH FIBER CONTENT INCLUDE:  Fruits. Apple, peach, pear, tangerine, raisins, prunes.  Vegetables. Brussels sprouts,  asparagus, broccoli, cabbage, carrot, cauliflower, romaine lettuce, spinach, summer squash, tomato, winter squash, zucchini.  Starchy Vegetables. Baked beans, kidney beans, lima beans, split peas,  lentils, potatoes (with skin).  Grains. Whole wheat bread, brown rice, bran flake cereal, plain oatmeal, white rice, shredded wheat, bran muffins. SEEK IMMEDIATE MEDICAL CARE IF:   You develop increasing pain or severe bloating.  You have an oral temperature above 102 F (38.9 C), not controlled by medicine.  You develop vomiting or bowel movements that are bloody or black. Document Released: 07/13/2004 Document Revised: 01/08/2012 Document Reviewed: 03/16/2010 [facility] Patient Information 2014 [place], [place].

## 2013-03-26 ENCOUNTER — Encounter: Payer: Self-pay | Admitting: Emergency Medicine

## 2013-03-26 ENCOUNTER — Ambulatory Visit (INDEPENDENT_AMBULATORY_CARE_PROVIDER_SITE_OTHER): Payer: Medicare Other | Admitting: Emergency Medicine

## 2013-03-26 VITALS — BP 130/82 | HR 73 | Temp 98.4°F | Ht 60.0 in | Wt 146.6 lb

## 2013-03-26 DIAGNOSIS — R918 Other nonspecific abnormal finding of lung field: Secondary | ICD-10-CM | POA: Diagnosis not present

## 2013-03-26 DIAGNOSIS — R0989 Other specified symptoms and signs involving the circulatory and respiratory systems: Secondary | ICD-10-CM

## 2013-03-26 DIAGNOSIS — R0609 Other forms of dyspnea: Secondary | ICD-10-CM | POA: Diagnosis not present

## 2013-03-26 HISTORY — DX: Other nonspecific abnormal finding of lung field: R91.8

## 2013-03-26 NOTE — Progress Notes (Signed)
Subjective:    Patient ID: Angel Holland, female    DOB: [birth year], 72 y.o.   MRN: [id number]  HPI 72 yo former smoker (50 pk-yrs), hx uterine fibroids, diverticular disease. Underwent CT abd in April '14 for abd pain, diarrhea, nausea. Has seen Dr Jarold Motto. The scan identified two RML nodules - 4mm and 3mm.  She presents for follow up of the nodules. She is not having any pulm symptoms, but on further questioning she does notice exertional SOB.    Review of Systems  Constitutional: Negative for fever and unexpected weight change.  HENT: Negative for ear pain, nosebleeds, congestion, sore throat, rhinorrhea, sneezing, trouble swallowing, dental problem, postnasal drip and sinus pressure.   Eyes: Negative for redness and itching.  Respiratory: Negative for cough, chest tightness, shortness of breath and wheezing.   Cardiovascular: Negative for palpitations and leg swelling.  Gastrointestinal: Negative for nausea and vomiting.  Genitourinary: Negative for dysuria.  Musculoskeletal: Negative for joint swelling.  Skin: Negative for rash.  Neurological: Positive for headaches.  Hematological: Does not bruise/bleed easily.  Psychiatric/Behavioral: Negative for dysphoric mood. The patient is not nervous/anxious.     Past Medical History  Diagnosis Date  . Tobacco abuse   . Fibroid uterus   . History of D&C     x2  . Bruxism   . Colon polyps   . Diverticulosis of colon (without mention of hemorrhage)   . Unspecified hemorrhoids without mention of complication      Family History  Problem Relation Age of Onset  . Heart disease Mother     CHF  . Cancer Father     oral     History   Social History  . Marital Status: Married    Spouse Name: N/A    Number of Children: 2  . Years of Education: N/A   Occupational History  . retired     Audiological scientist   Social History Main Topics  . Smoking status: Former Smoker -- 1.00 packs/day for 50 years    Types: Cigarettes    Quit date:  07/23/2008  . Smokeless tobacco: Never Used  . Alcohol Use: No  . Drug Use: No  . Sexually Active: Not Currently   Other Topics Concern  . Not on file   Social History Narrative  . No narrative on file     Allergies  Allergen Reactions  . Biaxin (Clarithromycin)     REACTION: BREASTS INFLAMMED  . Codeine     REACTION: PANIC ATTACKS, CAN'T SLEEP     Outpatient Prescriptions Prior to Visit  Medication Sig Dispense Refill  . aspirin 81 MG tablet Take 81 mg by mouth daily.        Marland Kitchen b complex vitamins tablet Take 1 tablet by mouth daily.        . Calcium-Magnesium-Vitamin D (CALCIUM MAGNESIUM PO) Take 1 tablet by mouth daily.      Marland Kitchen HYDROcodone-acetaminophen (VICODIN) 5-500 MG per tablet Take 1 tablet by mouth every 6 (six) hours as needed for pain.      Marland Kitchen ibuprofen (ADVIL,MOTRIN) 200 MG tablet Take 400 mg by mouth every 6 (six) hours as needed for pain.      . metroNIDAZOLE (METROGEL) 0.75 % gel Apply 1 application topically at bedtime. Applies to face for rosacea      . OVER THE COUNTER MEDICATION Take 1 tablet by mouth daily. Osteo matrix      . pyridOXINE (VITAMIN B-6) 100 MG tablet Take 100 mg by  mouth daily.      . vitamin B-12 (CYANOCOBALAMIN) 1000 MCG tablet Take 1,000 mcg by mouth daily.      . vitamin C (ASCORBIC ACID) 500 MG tablet Take 500 mg by mouth daily.        . vitamin E (VITAMIN E) 400 UNIT capsule Take 400 Units by mouth daily.        . Multiple Vitamin (MULTIVITAMIN) tablet Take 1 tablet by mouth daily.         No facility-administered medications prior to visit.         Objective:   Physical Exam Filed Vitals:   03/26/13 1415  BP: 130/82  Pulse: 73  Temp: 98.4 F (36.9 C)  TempSrc: Oral  Height: 5' (1.524 m)  Weight: 146 lb 9.6 oz (66.497 kg)  SpO2: 96%   Gen: Pleasant, well-nourished, in no distress,  normal affect  ENT: No lesions,  mouth clear,  oropharynx clear, no postnasal drip  Neck: No JVD, no TMG, no carotid bruits  Lungs: No use  of accessory muscles, no dullness to percussion, clear without rales or rhonchi  Cardiovascular: RRR, heart sounds normal, no murmur or gallops, no peripheral edema  Musculoskeletal: No deformities, no cyanosis or clubbing  Neuro: alert, non focal  Skin: Warm, no lesions or rashes       Assessment & Plan:  Pulmonary nodules Seen on CT scan abd. 01/2013.  - will perform non-contrasted CT scan chest, assuming no new nodules will plan for repeat scan in 1 year.   Dyspnea on exertion - will perform full PFT and then revisit, consider BD's in the future.

## 2013-03-26 NOTE — Assessment & Plan Note (Signed)
Seen on CT scan abd. 01/2013.  - will perform non-contrasted CT scan chest, assuming no new nodules will plan for repeat scan in 1 year.

## 2013-03-26 NOTE — Patient Instructions (Addendum)
Please schedule CT scan of the chest for Angel Holland to review next time We will likely need to repeat your CT scan of the chest in 1 year We will perform full pulmonary function testing at your next visit Follow with Dr Delton Coombes next available with full PFT

## 2013-03-26 NOTE — Assessment & Plan Note (Signed)
-   will perform full PFT and then revisit, consider BD's in the future.

## 2013-03-28 ENCOUNTER — Ambulatory Visit (INDEPENDENT_AMBULATORY_CARE_PROVIDER_SITE_OTHER)
Admission: RE | Admit: 2013-03-28 | Discharge: 2013-03-28 | Disposition: A | Discharge status: Home / Self Care | Payer: Medicare Other | Source: Ambulatory Visit | Attending: Emergency Medicine | Admitting: Emergency Medicine

## 2013-03-28 DIAGNOSIS — R918 Other nonspecific abnormal finding of lung field: Secondary | ICD-10-CM | POA: Diagnosis not present

## 2013-03-28 DIAGNOSIS — R911 Solitary pulmonary nodule: Secondary | ICD-10-CM | POA: Diagnosis not present

## 2013-04-10 NOTE — Progress Notes (Signed)
Quick Note:  ATC patient, no answer [id number] ______ 

## 2013-04-22 NOTE — Progress Notes (Signed)
Quick Note:  ATC patient, no answer [id number] ______ 

## 2013-04-24 ENCOUNTER — Telehealth: Payer: Self-pay | Admitting: Emergency Medicine

## 2013-04-24 NOTE — Telephone Encounter (Signed)
Spoke with patient, returning call for CT results  Notes Recorded by Leslye Peer, MD on 04/10/2013 at 4:42 PM Please let the patient know that her CT scan did not show any new nodules beyond the ones we already knew about in the RML. She will need a repeat CT scan of the chest in a year. Thanks  Results given, patient verbalized understanding and nothing further needed at this time

## 2013-04-27 ENCOUNTER — Telehealth: Payer: Self-pay | Admitting: Physician Assistant

## 2013-04-27 NOTE — Telephone Encounter (Signed)
Patient called in today 04/27/2013 with complaints of lower Donald pain x1 month. She says the pain is excruciating at times and she cannot get any relief. This is a pain same pain she had CT of the abdomen and pelvis done for in April and was shown to have diverticulosis with a slight amount of free fluid but no diverticulitis. She's been seen by Dr. Jarold Motto in May and was not put on antibiotics at that time. She says pain is not any worse but has been persistent. I will: Bentyl 10 mg 3 times a day when necessary for cramping and patient will need office visit on Monday or Tuesday.

## 2013-04-28 ENCOUNTER — Other Ambulatory Visit (INDEPENDENT_AMBULATORY_CARE_PROVIDER_SITE_OTHER): Payer: Medicare Other

## 2013-04-28 ENCOUNTER — Telehealth: Payer: Self-pay | Admitting: *Deleted

## 2013-04-28 ENCOUNTER — Encounter: Payer: Self-pay | Admitting: Physician Assistant

## 2013-04-28 ENCOUNTER — Ambulatory Visit (INDEPENDENT_AMBULATORY_CARE_PROVIDER_SITE_OTHER): Payer: Medicare Other | Admitting: Physician Assistant

## 2013-04-28 VITALS — BP 102/60 | HR 82 | Ht 60.0 in | Wt 141.2 lb

## 2013-04-28 DIAGNOSIS — R1032 Left lower quadrant pain: Secondary | ICD-10-CM

## 2013-04-28 DIAGNOSIS — K5732 Diverticulitis of large intestine without perforation or abscess without bleeding: Secondary | ICD-10-CM

## 2013-04-28 DIAGNOSIS — K5730 Diverticulosis of large intestine without perforation or abscess without bleeding: Secondary | ICD-10-CM

## 2013-04-28 DIAGNOSIS — Z86010 Personal history of colonic polyps: Secondary | ICD-10-CM | POA: Diagnosis not present

## 2013-04-28 LAB — CBC WITH DIFFERENTIAL/PLATELET
Basophils Absolute: 0 10*3/uL (ref 0.0–0.1)
Eosinophils Absolute: 0 10*3/uL (ref 0.0–0.7)
Hemoglobin: 12.6 g/dL (ref 12.0–15.0)
Lymphocytes Relative: 16.9 % (ref 12.0–46.0)
MCHC: 33.3 g/dL (ref 30.0–36.0)
Monocytes Relative: 6.7 % (ref 3.0–12.0)
Neutrophils Relative %: 75.9 % (ref 43.0–77.0)
Platelets: 389 10*3/uL (ref 150.0–400.0)
RDW: 14.3 % (ref 11.5–14.6)

## 2013-04-28 MED ORDER — FLUCONAZOLE 150 MG PO TABS: ORAL_TABLET | ORAL | Status: DC

## 2013-04-28 MED ORDER — CIPROFLOXACIN HCL 500 MG PO TABS: 500.0000 mg | ORAL_TABLET | Freq: Two times a day (BID) | ORAL | Status: DC

## 2013-04-28 MED ORDER — DICYCLOMINE HCL 10 MG PO CAPS: 10.0000 mg | ORAL_CAPSULE | Freq: Three times a day (TID) | ORAL | Status: DC

## 2013-04-28 MED ORDER — METRONIDAZOLE 500 MG PO TABS: 500.0000 mg | ORAL_TABLET | Freq: Two times a day (BID) | ORAL | Status: DC

## 2013-04-28 NOTE — Patient Instructions (Addendum)
Please go to the basement level to have your labs drawn.  We sent prescriptions to [facility], [address] 1. Diflucan 150 mg 2. Cipro 500 mg 3. Metronidazole ( Flagyl ) 500 mg 4. Dicyclomine ( Bentyl)  10 mg  Eat small frequent meals 5 times daily.

## 2013-04-28 NOTE — Telephone Encounter (Signed)
Message left by Mike Gip, PA per weekend call by pt. Pt having lower abdominal pain with recent diverticulitis and seen by Dr Jarold Motto on 03/25/13. Last COLON 08/07/2006 showing diverticulosis from descending to sigmoid. Amy wanted pt seen today or tomorrow. lmom for pt to call back.

## 2013-04-28 NOTE — Telephone Encounter (Signed)
Pt reports the Bentyl really helped and she only has a twinge of pain every once in a while she has diarrhea, brown watery stools too numerous to count. She believes the diarrhea is d/t the fact she hasn't had any solid food since last Thursday. The pain when she has it, is on the lower left side. She will see Mike Gip, PA today.

## 2013-04-28 NOTE — Progress Notes (Signed)
Subjective:    Patient ID: Angel Holland, female    DOB: Jun 12, 1941, 72 y.o.   MRN: [id number]  HPI  Angel Holland is a pleasant 72 year old white female known to Dr. Jarold Motto. She last had colonoscopy in October 2007 and noted to have descending to sigmoid colon diverticulosis with thickened haustral folds and spasm. She also had a 5 mm rectal polyp which was removed and did not show any evidence of adenomatous tissue. He had an ER visit in April of 2014 with complaints of lower abdominal pain and was given a short course of Cipro and Vicodin . She had CT of the abdomen and pelvis on 02/15/2013 done with contrast which showed scattered diverticulosis from the transverse colon to the sigmoid colon. She was noted to have a small amount of free fluid along the sigmoid colon and adjacent to the uterus which was  felt possibly physiologic , and  no definite evidence of diverticulitis. Subsequent pelvic ultrasound was done which was negative however ovaries were unable to be visualized. At that time her WBC was 15.6 , hemoglobin 14.6 ,hematocrit 42. She was seen by Dr. Jarold Motto in May of 2014 at that time did not feel that she had diverticulitis and was encouraged to take Benefiber for her diverticulosis. Comes in today stating that she's been having lower abdominal pain over the past 2 months. She called yesterday with complaints of sharp pains in her lower abdomen and was started on Bentyl 10 mg 3 times daily as needed for cramping and advised to have office visit. She says the Bentyl very definitely helped but she still having occasional sharp pains in her left lower abdomen. She also has loose stools intermittently. No melena or hematochezia. Her appetite has been decreased and she says she's been having sweats off and on over the past month. She currently denies any dysuria urgency or frequency.    Review of Systems  Constitutional: Positive for appetite change.  HENT: Negative.   Eyes: Negative.    Respiratory: Negative.   Cardiovascular: Negative.   Gastrointestinal: Positive for abdominal pain and diarrhea.  Endocrine: Negative.   Genitourinary: Negative.   Musculoskeletal: Negative.   Skin: Negative.   Allergic/Immunologic: Negative.   Neurological: Negative.   Hematological: Negative.   Psychiatric/Behavioral: Negative.    Outpatient Prescriptions Prior to Visit  Medication Sig Dispense Refill  . aspirin 81 MG tablet Take 81 mg by mouth daily.        Marland Kitchen b complex vitamins tablet Take 1 tablet by mouth daily.        . Calcium-Magnesium-Vitamin D (CALCIUM MAGNESIUM PO) Take 1 tablet by mouth daily.      . DiphenhydrAMINE HCl, Sleep, (WAL-SOM MAXIMUM STRENGTH) 50 MG CAPS Take 1 capsule by mouth daily.      Marland Kitchen HYDROcodone-acetaminophen (VICODIN) 5-500 MG per tablet Take 1 tablet by mouth every 6 (six) hours as needed for pain.      Marland Kitchen ibuprofen (ADVIL,MOTRIN) 200 MG tablet Take 400 mg by mouth every 6 (six) hours as needed for pain.      . metroNIDAZOLE (METROGEL) 0.75 % gel Apply 1 application topically at bedtime. Applies to face for rosacea      . Multiple Vitamin (MULTIVITAMIN) tablet Take 1 tablet by mouth daily.        Marland Kitchen OVER THE COUNTER MEDICATION Take 1 tablet by mouth daily. Osteo matrix      . pyridOXINE (VITAMIN B-6) 100 MG tablet Take 100 mg by mouth daily.      Marland Kitchen  vitamin B-12 (CYANOCOBALAMIN) 1000 MCG tablet Take 1,000 mcg by mouth daily.      . vitamin C (ASCORBIC ACID) 500 MG tablet Take 500 mg by mouth daily.        . vitamin E (VITAMIN E) 400 UNIT capsule Take 400 Units by mouth daily.         No facility-administered medications prior to visit.   Allergies  Allergen Reactions  . Biaxin (Clarithromycin)     REACTION: BREASTS INFLAMMED  . Codeine     REACTION: PANIC ATTACKS, CAN'T SLEEP   Patient Active Problem List   Diagnosis Date Noted  . Diverticulosis of colon without hemorrhage 04/28/2013  . Personal history of colonic polyps 04/28/2013  . Pulmonary  nodules 03/26/2013  . Dyspnea on exertion 03/26/2013  . Nocturia 08/28/2011  . Sleep initiation dysfunction 08/28/2011  . CHRONIC RHINITIS 08/23/2009  . PAROXYSMAL NOCTURNAL DYSPNEA 08/11/2009  . TOBACCO ABUSE 09/19/2007   History  Substance Use Topics  . Smoking status: Former Smoker -- 1.00 packs/day for 50 years    Types: Cigarettes    Quit date: 07/23/2008  . Smokeless tobacco: Never Used  . Alcohol Use: No   family history includes Cancer in her father and Heart disease in her mother.     Objective:   Physical Exam  Well-developed older white female in no acute distress, pleasant, anxious. Blood pressure 102/60 pulse 82 height 5 foot weight 141. HEENT; nontraumatic normocephalic EOMI PERRLA sclera anicteric,Neck; Supple no JVD, Cardiovascular; regular rate and rhythm with S1-S2 no murmur or gallop, Pulmonary ;clear bilaterally, Abdomen; soft bowel sounds are present she has tenderness in the left mid quadrant left lower quadrant no guarding no rebound no palpable mass or hepatosplenomegaly is a lower midline incisional scar, Rectal ;exam not done, Extremities; no clubbing cyanosis or edema skin warm and dry, Psych; mood and affect normal and appropriate       Assessment & Plan:  #39 72 year old white female with previously documented diverticulosis and CT scan April 2014 showing a trace amount of free fluid along the sigmoid colon adjacent to the uterus. Patient has had persistent abdominal pain for 2 months and suspect that she does have a smoldering  diverticulitis or other inflammatory process. #2 anxiety Plan; she will continue Bentyl 10 mg 3 times daily as needed for cramping and spasm Start Cipro 500 mg by mouth twice daily x14 day Start Flagyl 500 mg by mouth twice daily x14 days Diflucan 150 mg once as needed for vaginal candidiasis CBC with differential today She is encouraged to a soft bland diet with frequent small feedings Followup in the office in 2-3 weeks-if she  has not significantly better she will need CT of the abdomen and pelvis repeated.. She is aware to call in the interim should her symptoms worsen.

## 2013-04-29 ENCOUNTER — Encounter: Payer: Self-pay | Admitting: Physician Assistant

## 2013-04-29 NOTE — Telephone Encounter (Signed)
Error

## 2013-05-01 ENCOUNTER — Encounter: Payer: Self-pay | Admitting: Emergency Medicine

## 2013-05-01 ENCOUNTER — Ambulatory Visit (INDEPENDENT_AMBULATORY_CARE_PROVIDER_SITE_OTHER): Payer: Medicare Other | Admitting: Emergency Medicine

## 2013-05-01 VITALS — BP 120/70 | HR 87 | Temp 97.7°F | Ht 59.0 in | Wt 142.0 lb

## 2013-05-01 DIAGNOSIS — R0609 Other forms of dyspnea: Secondary | ICD-10-CM

## 2013-05-01 DIAGNOSIS — R0989 Other specified symptoms and signs involving the circulatory and respiratory systems: Secondary | ICD-10-CM

## 2013-05-01 DIAGNOSIS — R918 Other nonspecific abnormal finding of lung field: Secondary | ICD-10-CM | POA: Diagnosis not present

## 2013-05-01 NOTE — Progress Notes (Signed)
Subjective:    Patient ID: Angel Holland, female    DOB: February 13, 1941, 72 y.o.   MRN: [id number]  HPI 72 yo former smoker (50 pk-yrs), hx uterine fibroids, diverticular disease. Underwent CT abd in April '14 for abd pain, diarrhea, nausea. Has seen Dr Jarold Motto. The scan identified two RML nodules - 4mm and 3mm.  She presents for follow up of the nodules. She is not having any pulm symptoms, but on further questioning she does notice exertional SOB.   ROV 05/01/13 -- hx tobacco, CT scan 4/14 with small pulm nodules. Repeat scan 04/22/13 shows no interval change.  PFT done today > possible borderline AFL based on curve. She denies SOB, but she is having a lot of trouble with her abd cramping and diverticulitis.    Review of Systems  Constitutional: Negative for fever and unexpected weight change.  HENT: Negative for ear pain, nosebleeds, congestion, sore throat, rhinorrhea, sneezing, trouble swallowing, dental problem, postnasal drip and sinus pressure.   Eyes: Negative for redness and itching.  Respiratory: Negative for cough, chest tightness, shortness of breath and wheezing.   Cardiovascular: Negative for palpitations and leg swelling.  Gastrointestinal: Negative for nausea and vomiting.  Genitourinary: Negative for dysuria.  Musculoskeletal: Negative for joint swelling.  Skin: Negative for rash.  Neurological: Positive for headaches.  Hematological: Does not bruise/bleed easily.  Psychiatric/Behavioral: Negative for dysphoric mood. The patient is not nervous/anxious.        Objective:   Physical Exam Filed Vitals:   05/01/13 1334  BP: 120/70  Pulse: 87  Temp: 97.7 F (36.5 C)  TempSrc: Oral  Height: 4\' 11"  (1.499 m)  Weight: 142 lb (64.411 kg)  SpO2: 95%   Gen: Pleasant, well-nourished, in no distress,  normal affect  ENT: No lesions,  mouth clear,  oropharynx clear, no postnasal drip  Neck: No JVD, no TMG, no carotid bruits  Lungs: No use of accessory muscles, no dullness  to percussion, clear without rales or rhonchi  Cardiovascular: RRR, heart sounds normal, no murmur or gallops, no peripheral edema  Musculoskeletal: No deformities, no cyanosis or clubbing  Neuro: alert, non focal  Skin: Warm, no lesions or rashes  04/22/13 --  Comparison: CT of the abdomen and pelvis 02/15/2013.  Findings:  Mediastinum: Heart size is normal. There is no significant  pericardial fluid, thickening or pericardial calcification. There  is atherosclerosis of the thoracic aorta, the great vessels of the  mediastinum and the coronary arteries, including calcified  atherosclerotic plaque in the left main, left anterior descending,  left circumflex and right coronary arteries. No pathologically  enlarged mediastinal or hilar lymph nodes. Please note that  accurate exclusion of hilar adenopathy is limited on noncontrast CT  scans. Esophagus is unremarkable in appearance.  Lungs/Pleura: There are a few scattered tiny pulmonary nodules in  the right lung, largest of which are in the right middle lobe  measuring 4 mm (image 34 series 3), 3 mm (image 35 of series 3) and  3 mm (image 33 of series 3). No other larger more suspicious  appearing pulmonary nodules or masses are otherwise noted. No  acute consolidative air space disease. Very mild centrilobular  emphysema is also noted. No pleural effusions.  Upper Abdomen: Unremarkable.  Musculoskeletal: There are no aggressive appearing lytic or blastic  lesions noted in the visualized portions of the skeleton.  IMPRESSION:  1. There are a few tiny nonspecific pulmonary nodules in the right  lung, with the largest measuring only 4  mm in the right middle  lobe. Given the smoking related changes in the lungs (mild  centrilobular emphysema), the patient is at increased risk for  bronchogenic carcinoma, so a follow-up chest CT at 1 year is  recommended. This recommendation follows the consensus statement:  Guidelines for Management  of Small Pulmonary Nodules Detected on CT  Scans: A Statement from the Fleischner Society as published in  Radiology 2005; 237:395-400.  2. Atherosclerosis, including left main and three-vessel coronary  artery disease. Assessment for potential risk factor modification,  dietary therapy or pharmacologic therapy may be warranted, if  clinically indicated.      Assessment & Plan:  Pulmonary nodules 3-79mm and stable over 3 months on CT - next scan in 1 year - rov after to review  Dyspnea on exertion She denies current problems. 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  - will defer BD's for now, reconsider if she changes.

## 2013-05-01 NOTE — Progress Notes (Signed)
PFT done today. 

## 2013-05-01 NOTE — Patient Instructions (Addendum)
Your breathing testing shows little to no effects of being a former smoker.  Your CT scan is stable. We will need to repeat your scan in June 2015.  Follow with Dr Delton Coombes in June 2015 to review that scan, or sooner if you have any breathing problems.

## 2013-05-01 NOTE — Assessment & Plan Note (Signed)
She denies current problems. 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  - will defer BD's for now, reconsider if she changes.

## 2013-05-01 NOTE — Assessment & Plan Note (Signed)
3-82mm and stable over 3 months on CT - next scan in 1 year - rov after to review

## 2013-05-19 ENCOUNTER — Ambulatory Visit: Payer: Medicare Other | Admitting: Physician Assistant

## 2013-05-26 ENCOUNTER — Ambulatory Visit: Payer: Medicare Other | Admitting: Physician Assistant

## 2013-05-27 ENCOUNTER — Encounter: Payer: Self-pay | Admitting: Physician Assistant

## 2013-05-27 ENCOUNTER — Ambulatory Visit (INDEPENDENT_AMBULATORY_CARE_PROVIDER_SITE_OTHER): Payer: Medicare Other | Admitting: Physician Assistant

## 2013-05-27 VITALS — BP 128/80 | HR 73 | Ht 59.0 in | Wt 142.0 lb

## 2013-05-27 DIAGNOSIS — K5732 Diverticulitis of large intestine without perforation or abscess without bleeding: Secondary | ICD-10-CM

## 2013-05-27 DIAGNOSIS — K589 Irritable bowel syndrome without diarrhea: Secondary | ICD-10-CM

## 2013-05-27 MED ORDER — DICYCLOMINE HCL 10 MG PO CAPS: 10.0000 mg | ORAL_CAPSULE | Freq: Three times a day (TID) | ORAL | Status: DC

## 2013-05-27 NOTE — Patient Instructions (Addendum)
We sent refills on the Bentyl prescription to [facility] [address]    Follow up with either Dr. Sheryn Bison or [facility] PA-C when needed.

## 2013-05-27 NOTE — Progress Notes (Signed)
Subjective:    Patient ID: Angel Holland, female    DOB: 11/03/1940, 72 y.o.   MRN: [id number]  HPI Angel Holland is a very nice 72 year old white female known to Dr. Jarold Motto who was last seen by myself on 04/28/2013. At that time she had been having lower Donald pain for about 2 months which did become somewhat progressive and sharper. She had undergone CT scan of the abdomen and pelvis after an ER visit in April of 2014 for lower Donald pain and that showed scattered diverticulosis from the transverse colon to the sigmoid colon and a small amount of free fluid along the sigmoid colon adjacent to the uterus. There was no definite diverticulitis. When she presented with persistent pain she was started on a course of Bentyl 3 times daily as needed for cramping and was given a course of Cipro and Flagyl for 14 days. She comes back in today for followup stating she feels much better and that the antibiotics completely cleared up her abdominal pain. She is still using Bentyl as needed particularly if she goes out to eat but says that she is feeling good. She may get at 20 mg of lower Donald discomfort occasionally. She says her bowel movements are fairly normal and she's been using Benefiber 3 times a day.    Review of Systems  Constitutional: Negative.   HENT: Negative.   Eyes: Negative.   Respiratory: Negative.   Cardiovascular: Negative.   Gastrointestinal: Negative.   Endocrine: Negative.   Genitourinary: Negative.   Musculoskeletal: Negative.   Allergic/Immunologic: Negative.   Neurological: Negative.   Hematological: Negative.   Psychiatric/Behavioral: Negative.    Outpatient Prescriptions Prior to Visit  Medication Sig Dispense Refill  . aspirin 81 MG tablet Take 81 mg by mouth daily.        Marland Kitchen b complex vitamins tablet Take 1 tablet by mouth daily.        . fluconazole (DIFLUCAN) 150 MG tablet Take 1 tab and wait 4 days and take the 2nd tablet.  2 tablet  0  . ibuprofen (ADVIL,MOTRIN) 200  MG tablet Take 400 mg by mouth every 6 (six) hours as needed for pain.      . metroNIDAZOLE (METROGEL) 0.75 % gel Apply 1 application topically at bedtime. Applies to face for rosacea      . OVER THE COUNTER MEDICATION Take 1 tablet by mouth daily. Osteo matrix      . pyridOXINE (VITAMIN B-6) 100 MG tablet Take 100 mg by mouth daily.      . vitamin B-12 (CYANOCOBALAMIN) 1000 MCG tablet Take 1,000 mcg by mouth daily.      . vitamin C (ASCORBIC ACID) 500 MG tablet Take 500 mg by mouth daily.        . vitamin E (VITAMIN E) 400 UNIT capsule Take 400 Units by mouth daily.        . Wheat Dextrin (BENEFIBER) CHEW Chew by mouth. Powder three times a day      . dicyclomine (BENTYL) 10 MG capsule Take 1 capsule (10 mg total) by mouth 4 (four) times daily -  before meals and at bedtime.  40 capsule  3  . Calcium-Magnesium-Vitamin D (CALCIUM MAGNESIUM PO) Take 1 tablet by mouth daily.      Marland Kitchen HYDROcodone-acetaminophen (VICODIN) 5-500 MG per tablet Take 1 tablet by mouth every 6 (six) hours as needed for pain.      . metroNIDAZOLE (FLAGYL) 500 MG tablet Take 1 tablet (500 mg total)  by mouth 2 (two) times daily.  28 tablet  0   No facility-administered medications prior to visit.   Allergies  Allergen Reactions  . Biaxin (Clarithromycin)     REACTION: BREASTS INFLAMMED  . Codeine     REACTION: PANIC ATTACKS, CAN'T SLEEP   Patient Active Problem List   Diagnosis Date Noted  . Diverticulosis of colon without hemorrhage 04/28/2013  . Personal history of colonic polyps 04/28/2013  . Pulmonary nodules 03/26/2013  . Dyspnea on exertion 03/26/2013  . Nocturia 08/28/2011  . Sleep initiation dysfunction 08/28/2011  . CHRONIC RHINITIS 08/23/2009  . PAROXYSMAL NOCTURNAL DYSPNEA 08/11/2009  . TOBACCO ABUSE 09/19/2007   History  Substance Use Topics  . Smoking status: Former Smoker -- 1.00 packs/day for 50 years    Types: Cigarettes    Quit date: 07/23/2008  . Smokeless tobacco: Never Used  . Alcohol  Use: No   family history includes Cancer in her father and Heart disease in her mother.     Objective:   Physical Exam  well-developed older white female in no acute distress, pleasant blood pressure 128/80 pulse 73 height 4 foot 11 weight 142. HEENT; nontraumatic normocephalic EOMI PERRLA sclera anicteric, Supple; no JVD, Cardiovascular; regular rate and rhythm with S1-S2 no murmur or gallop, Pulmonary; clear bilaterally, Abdomen; soft nondistended bowel sounds are active there is no palpable mass or focal tenderness, Rectal; exam not done, Extremities; no clubbing cyanosis or edema skin warm and dry, Psych; mood and affect normal and        Assessment & Plan:  #72 72 year old female with resolved acute diverticulitis. Suspect she has a component of underlying IBS. Plan; continue Bentyl 10 mg up to 3 times daily as needed for abdominal spasm and cramping, she was given refills today She will continue Benefiber 2-3 times daily She is encouraged to call should she have any recurrent lower abdominal  pain and otherwise will see Dr. Jarold Motto or myself in followup in one year . She will be due for followup colonoscopy in 2017

## 2013-07-10 DIAGNOSIS — L820 Inflamed seborrheic keratosis: Secondary | ICD-10-CM | POA: Diagnosis not present

## 2013-07-10 DIAGNOSIS — L909 Atrophic disorder of skin, unspecified: Secondary | ICD-10-CM | POA: Diagnosis not present

## 2013-07-10 DIAGNOSIS — L570 Actinic keratosis: Secondary | ICD-10-CM | POA: Diagnosis not present

## 2013-07-10 DIAGNOSIS — Z85828 Personal history of other malignant neoplasm of skin: Secondary | ICD-10-CM | POA: Diagnosis not present

## 2013-07-10 DIAGNOSIS — L719 Rosacea, unspecified: Secondary | ICD-10-CM | POA: Diagnosis not present

## 2013-07-10 DIAGNOSIS — L821 Other seborrheic keratosis: Secondary | ICD-10-CM | POA: Diagnosis not present

## 2013-07-14 ENCOUNTER — Telehealth: Payer: Self-pay | Admitting: Family Medicine

## 2013-07-14 NOTE — Telephone Encounter (Signed)
Patient wants to clarify date of last pneumonia vaccine and if she needs a booster. Please call back. Thank you.

## 2013-07-14 NOTE — Telephone Encounter (Signed)
Patient was 66 according to our records.  She will need another pneumonia vaccine if that was her only one.  Left message on machine for patient to return our call.

## 2013-07-15 NOTE — Telephone Encounter (Signed)
Spoke with patient she will need a flu and a pneumonia vaccine please schedule for patient

## 2013-07-15 NOTE — Telephone Encounter (Signed)
Pt has been sch

## 2013-07-24 ENCOUNTER — Ambulatory Visit (INDEPENDENT_AMBULATORY_CARE_PROVIDER_SITE_OTHER): Payer: Medicare Other | Admitting: *Deleted

## 2013-07-24 DIAGNOSIS — Z23 Encounter for immunization: Secondary | ICD-10-CM

## 2013-12-19 ENCOUNTER — Ambulatory Visit (INDEPENDENT_AMBULATORY_CARE_PROVIDER_SITE_OTHER): Payer: Medicare Other | Admitting: Family Medicine

## 2013-12-19 VITALS — BP 124/76 | HR 88 | Temp 98.2°F | Resp 17 | Ht 59.5 in | Wt 148.0 lb

## 2013-12-19 DIAGNOSIS — H9209 Otalgia, unspecified ear: Secondary | ICD-10-CM | POA: Diagnosis not present

## 2013-12-19 DIAGNOSIS — H6120 Impacted cerumen, unspecified ear: Secondary | ICD-10-CM | POA: Diagnosis not present

## 2013-12-19 DIAGNOSIS — H9201 Otalgia, right ear: Secondary | ICD-10-CM

## 2013-12-19 NOTE — Progress Notes (Signed)
[facility] [address] [place] [address] [phone]  Date:  12/19/2013   Name:  Angel Holland   DOB:  Sep 01, 1941   MRN:  [id number]  PCP:  Joycelyn Man, MD    Chief Complaint: Otalgia   History of Present Illness:  Angel Holland is a 73 y.o. very pleasant female patient who presents with the following:  She notes "fluid" in her right ear for about one week. Sometimes she will feel a sharp pain.  She sometimes needs cerumen removed from her ears and feels this is the case again.     The hearing from her right ear is a little bit reduced.  It seemed to pop earlier this week and then her hearing was decreased.   No drainage from the ear.    Otherwise she feels well.  No cough, fever or ST.     Patient Active Problem List   Diagnosis Date Noted  . Diverticulosis of colon without hemorrhage 04/28/2013  . Personal history of colonic polyps 04/28/2013  . Pulmonary nodules 03/26/2013  . Dyspnea on exertion 03/26/2013  . Nocturia 08/28/2011  . Sleep initiation dysfunction 08/28/2011  . CHRONIC RHINITIS 08/23/2009  . PAROXYSMAL NOCTURNAL DYSPNEA 08/11/2009  . TOBACCO ABUSE 09/19/2007    Past Medical History  Diagnosis Date  . Tobacco abuse   . Fibroid uterus   . History of D&C     x2  . Bruxism   . Colon polyps   . Diverticulosis of colon (without mention of hemorrhage)   . Unspecified hemorrhoids without mention of complication   . Irritable bowel syndrome     Past Surgical History  Procedure Laterality Date  . Fibroid removed from uterus    . Cholecystectomy  2009  . Bce    . Dilation and curettage of uterus    . Appendectomy  1957    History  Substance Use Topics  . Smoking status: Former Smoker -- 1.00 packs/day for 50 years    Types: Cigarettes    Quit date: 07/23/2008  . Smokeless tobacco: Never Used  . Alcohol Use: No    Family History  Problem Relation Age of Onset  . Heart disease Mother     CHF  . Cancer Father      oral    Allergies  Allergen Reactions  . Biaxin [Clarithromycin]     REACTION: BREASTS INFLAMMED  . Codeine     REACTION: PANIC ATTACKS, CAN'T SLEEP    Medication list has been reviewed and updated.  Current Outpatient Prescriptions on File Prior to Visit  Medication Sig Dispense Refill  . aspirin 81 MG tablet Take 81 mg by mouth daily.        Marland Kitchen b complex vitamins tablet Take 1 tablet by mouth daily.        Marland Kitchen ibuprofen (ADVIL,MOTRIN) 200 MG tablet Take 400 mg by mouth every 6 (six) hours as needed for pain.      . metroNIDAZOLE (METROGEL) 0.75 % gel Apply 1 application topically at bedtime. Applies to face for rosacea      . OVER THE COUNTER MEDICATION Take 1 tablet by mouth daily. Osteo matrix      . pyridOXINE (VITAMIN B-6) 100 MG tablet Take 100 mg by mouth daily.      . vitamin B-12 (CYANOCOBALAMIN) 1000 MCG tablet Take 1,000 mcg by mouth daily.      . vitamin C (ASCORBIC ACID) 500 MG tablet Take 500 mg  by mouth daily.        . vitamin E (VITAMIN E) 400 UNIT capsule Take 400 Units by mouth daily.        . Wheat Dextrin (BENEFIBER) CHEW Chew by mouth. Powder three times a day      . dicyclomine (BENTYL) 10 MG capsule Take 1 capsule (10 mg total) by mouth 4 (four) times daily -  before meals and at bedtime.  40 capsule  6  . fluconazole (DIFLUCAN) 150 MG tablet Take 1 tab and wait 4 days and take the 2nd tablet.  2 tablet  0   No current facility-administered medications on file prior to visit.    Review of Systems:  As per HPI- otherwise negative.   Physical Examination: Filed Vitals:   12/19/13 1359  BP: 124/76  Pulse: 88  Temp: 98.2 F (36.8 C)  Resp: 17   Filed Vitals:   12/19/13 1359  Height: 4' 11.5" (1.511 m)  Weight: 148 lb (67.132 kg)   Body mass index is 29.4 kg/(m^2). Ideal Body Weight: Weight in (lb) to have BMI = 25: 125.6  GEN: WDWN, NAD, Non-toxic, A & O x 3, looks well HEENT: Atraumatic, Normocephalic. Neck supple. No masses, No LAD.  Right  ear with cerumen impaction- left is normal.  PEERL, EOMI.  Left TM and ear canal normal  Ears and Nose: No external deformity. CV: RRR, No M/G/R. No JVD. No thrill. No extra heart sounds. PULM: CTA B, no wheezes, crackles, rhonchi. No retractions. No resp. distress. No accessory muscle use. EXTR: No c/c/e NEURO Normal gait.  PSYCH: Normally interactive. Conversant. Not depressed or anxious appearing.  Calm demeanor.   Right ear irrigated and wax removed.  TM then normal, ear canal normal  Assessment and Plan: Ear pain, right  Cerumen impaction  cerumen removed from right ear- ear then normal.  Follow-up as needed  Signed Lamar Blinks, MD

## 2014-01-01 IMAGING — CT CT ABD-PELV W/ CM
1 of 3 series · 13 of 32 positions shown, 18 images · IV contrast (OMNIPAQUE 300)
Comparison: MRA of the abdomen performed 07/10/2008, and abdominal
ultrasound performed 07/09/2008

CLINICAL DATA: Lower back pain on the left side, radiating down the
leg.

CT ABDOMEN AND PELVIS WITH CONTRAST
TECHNIQUE: Multidetector CT imaging of the abdomen and pelvis was
performed following the standard protocol during bolus
administration of intravenous contrast.
Contrast: 100 mL of Omnipaque 300 IV contrast

[Series 2: abd/pel with · axial · 0.74mm/px · z∈[+1444,+1824]mm · 13 of 86 slices shown, 18 images]
[im 5/86  soft-tissue]
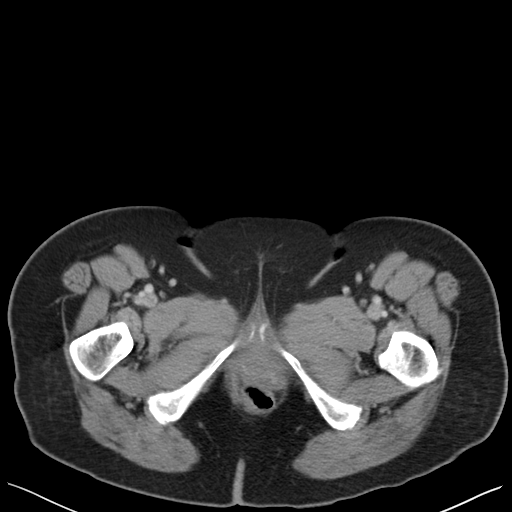
[im 5/86  bone]
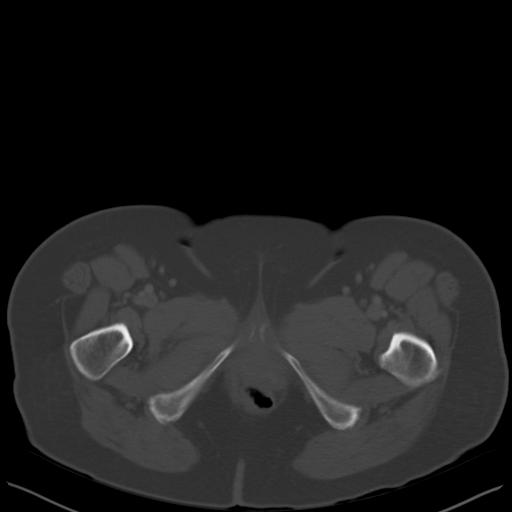
[im 14/86  soft-tissue]
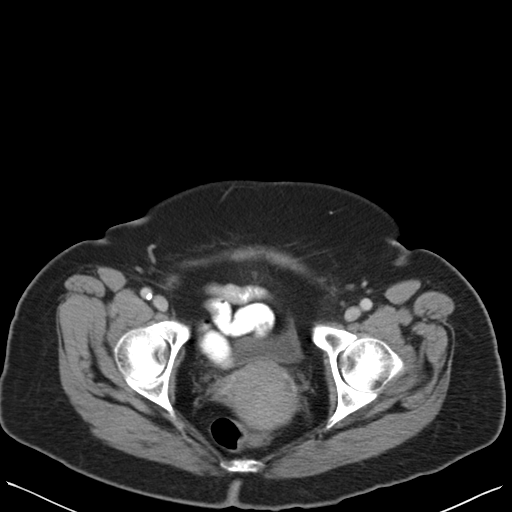
[im 18/86  soft-tissue]
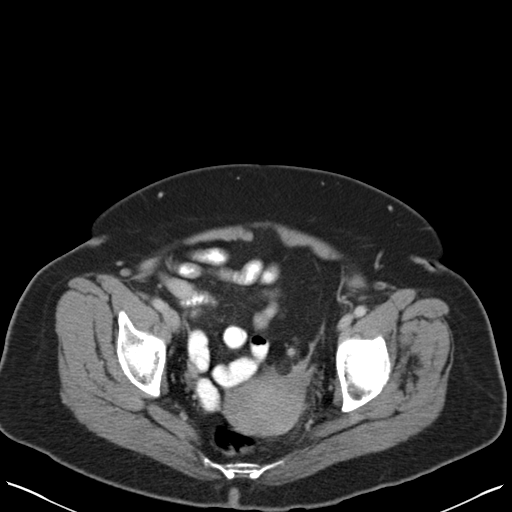
[im 27/86  soft-tissue]
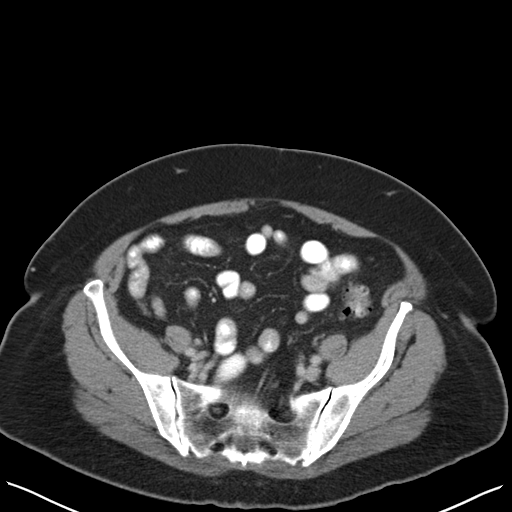
[im 32/86  soft-tissue]
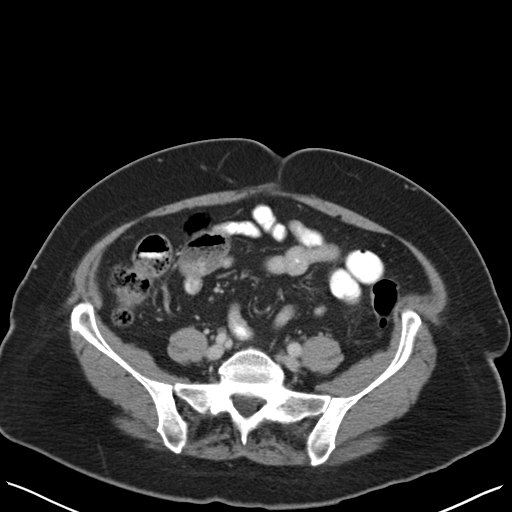
[im 41/86  soft-tissue]
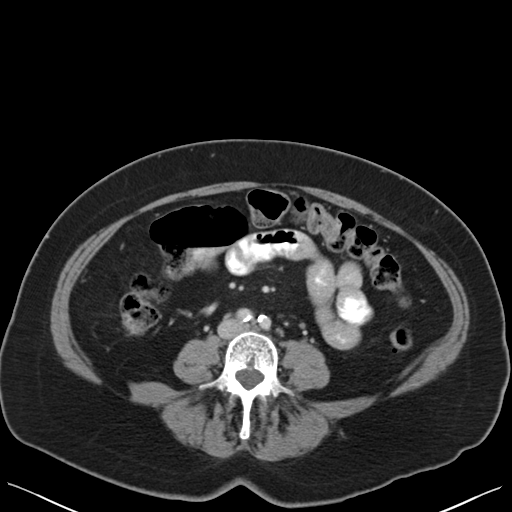
[im 45/86  soft-tissue]
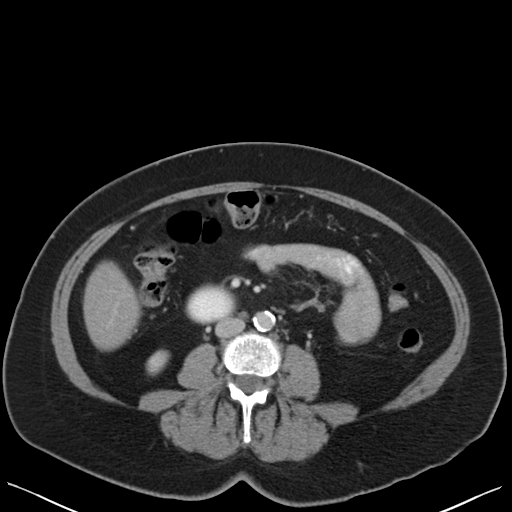
[im 54/86  soft-tissue]
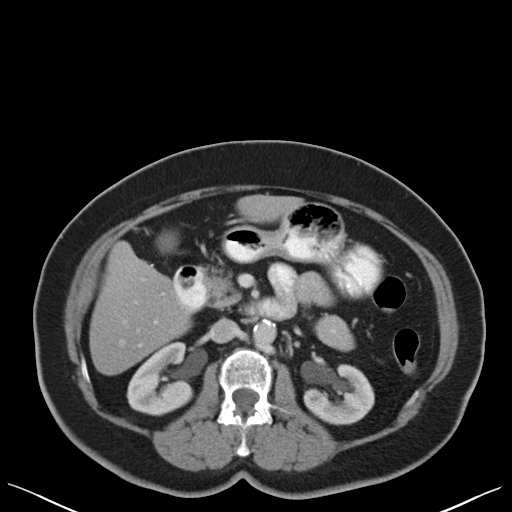
[im 59/86  soft-tissue]
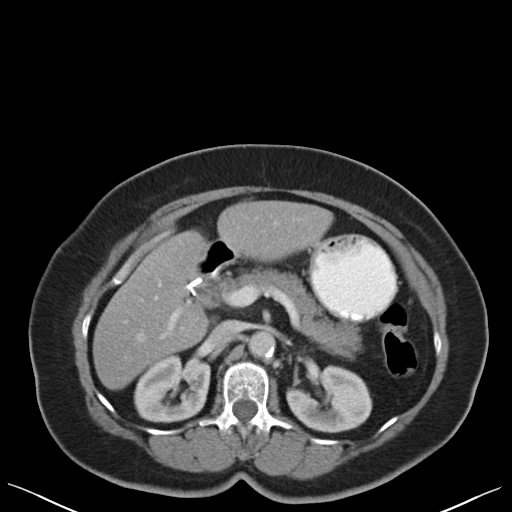
[im 59/86  bone]
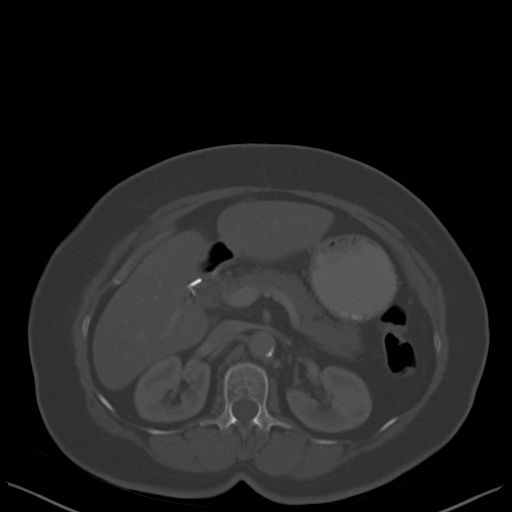
[im 68/86  soft-tissue]
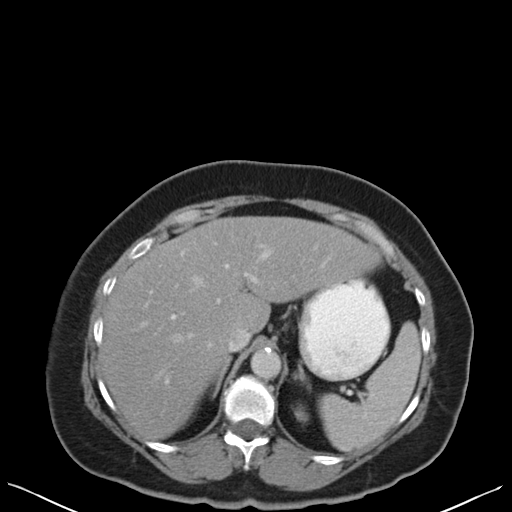
[im 68/86  lung]
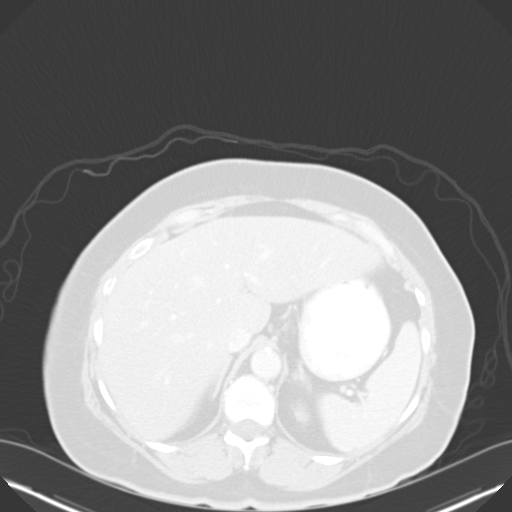
[im 72/86  soft-tissue]
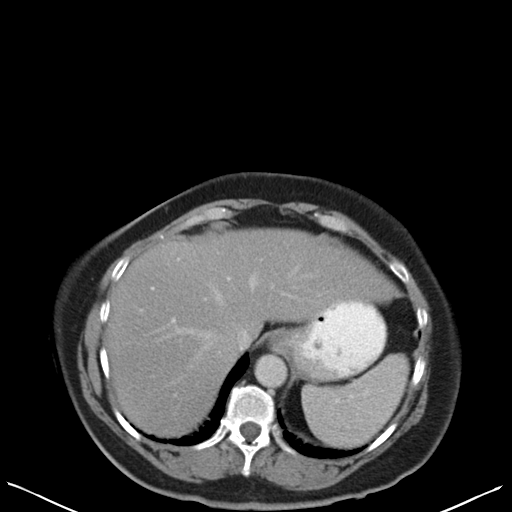
[im 72/86  lung]
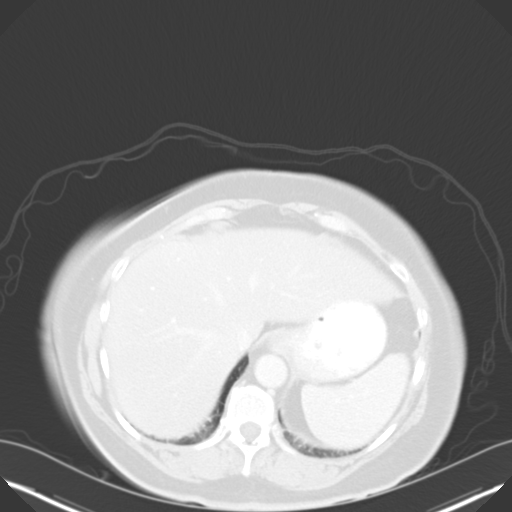
[im 77/86  lung]
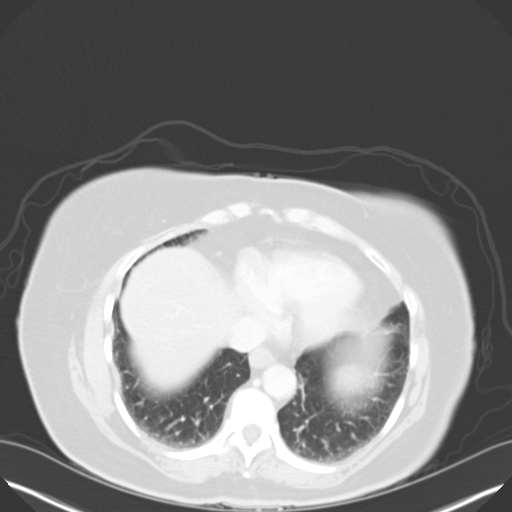
[im 81/86  soft-tissue]
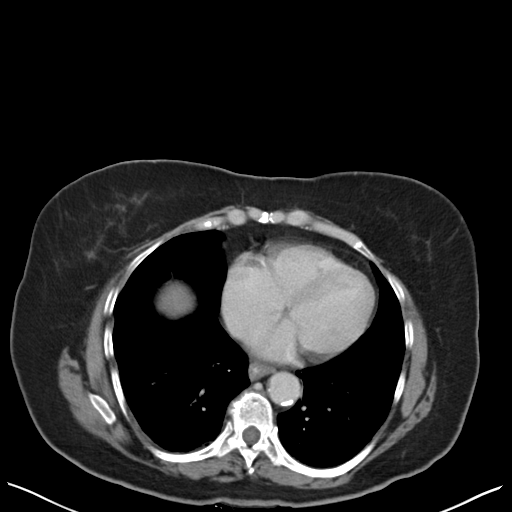
[im 81/86  lung]
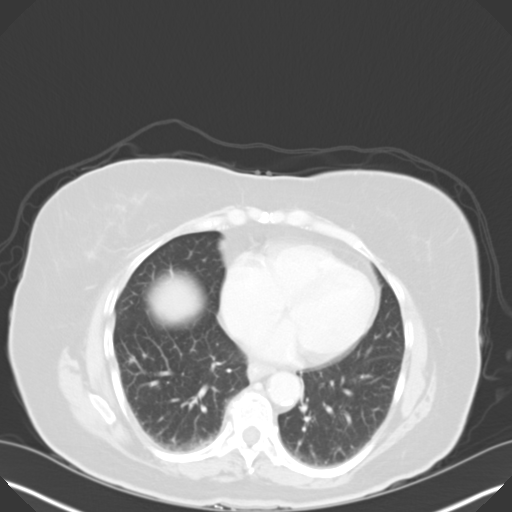

[13 of 32 positions shown; findings below may reference images not displayed]

FINDINGS: Minimal right basilar atelectasis is noted.  Two small
pulmonary nodules are noted within the right middle lobe, measuring
4 mm and 3 mm (images 3 and 4 of 22).  Though these are most likely
post infectious or inflammatory in nature, follow-up would be
warranted.

The liver and spleen are unremarkable in appearance.  Previously
noted nonocclusive thrombus within the middle hepatic vein is no
longer seen.  The patient is status post cholecystectomy, with
clips noted along the gallbladder fossa.  The pancreas and adrenal
glands are unremarkable.

The kidneys are unremarkable in appearance.  There is no evidence
of hydronephrosis.  No renal or ureteral stones are seen.  No
perinephric stranding is appreciated.  Small bilateral extrarenal
pelves are seen.

No free fluid is identified.  The small bowel is unremarkable in
appearance.  The stomach is within normal limits.  No acute
vascular abnormalities are seen.  Scattered calcification is noted
along the abdominal aorta and its branches.

The appendix is not carefully seen; there is no evidence for
appendicitis.  Contrast passes to the level of the cecum.
Scattered diverticulosis is noted along the descending and sigmoid
colon, without evidence of diverticulitis.  Trace free fluid along
the sigmoid colon, adjacent to the uterus, is thought to be
physiologic in nature, given the lack of soft tissue inflammation.

The bladder is mildly distended and grossly unremarkable.  The
uterus is grossly unremarkable in appearance.  The ovaries are
grossly symmetric, though not well assessed.  No suspicious adnexal
masses are seen.  No inguinal lymphadenopathy is seen.

No acute osseous abnormalities are identified.  Vacuum phenomenon
is noted at multiple levels along the lumbar spine.
IMPRESSION: 1.  No acute abnormality seen to explain the patient's symptoms.
2.  Scattered diverticulosis along the descending sigmoid colon,
without definite evidence of diverticulitis.  Trace free fluid
along the sigmoid colon and adjacent to the uterus is thought to be
physiologic in nature, given the lack of soft tissue inflammation.
3.  Scattered calcification along the abdominal aorta and its
branches.
4.  Two small pulmonary nodules within the right middle lobe,
measuring 4 mm and 3 mm.  If the patient is at high risk for
bronchogenic carcinoma, follow-up chest CT at 1 year is
recommended.  If the patient is at low risk, no follow-up is
needed.  This recommendation follows the consensus statement:
Guidelines for Management of Small Pulmonary Nodules Detected on CT
Scans:  A Statement from the [HOSPITAL] as published in

## 2014-02-06 DIAGNOSIS — Z1231 Encounter for screening mammogram for malignant neoplasm of breast: Secondary | ICD-10-CM | POA: Diagnosis not present

## 2014-03-01 ENCOUNTER — Emergency Department (HOSPITAL_COMMUNITY)
Admission: EM | Admit: 2014-03-01 | Discharge: 2014-03-01 | Discharge status: Against Medical Advice | Payer: Medicare Other | Attending: Emergency Medicine | Admitting: Emergency Medicine

## 2014-03-01 ENCOUNTER — Encounter (HOSPITAL_COMMUNITY): Payer: Self-pay | Admitting: Emergency Medicine

## 2014-03-01 DIAGNOSIS — R51 Headache: Secondary | ICD-10-CM | POA: Insufficient documentation

## 2014-03-01 DIAGNOSIS — Z87891 Personal history of nicotine dependence: Secondary | ICD-10-CM | POA: Insufficient documentation

## 2014-03-01 NOTE — ED Notes (Signed)
Pt left AMA °

## 2014-03-01 NOTE — ED Notes (Signed)
shes had a headache for the past few days, she tried several OTC meds with no relief. Shes had headaches since she hit her head on the concrete 2 years ago but this headache wont go away. A&Ox4, resp e/u.

## 2014-03-01 NOTE — ED Notes (Signed)
Pt updated on wait time.  

## 2014-03-02 ENCOUNTER — Encounter: Payer: Self-pay | Admitting: Family Medicine

## 2014-03-03 ENCOUNTER — Ambulatory Visit (INDEPENDENT_AMBULATORY_CARE_PROVIDER_SITE_OTHER): Payer: Medicare Other | Admitting: Family Medicine

## 2014-03-03 ENCOUNTER — Ambulatory Visit: Payer: Medicare Other

## 2014-03-03 ENCOUNTER — Ambulatory Visit
Admission: RE | Admit: 2014-03-03 | Discharge: 2014-03-03 | Disposition: A | Discharge status: Home / Self Care | Payer: Medicare Other | Source: Ambulatory Visit | Attending: Family Medicine | Admitting: Family Medicine

## 2014-03-03 VITALS — BP 120/80 | HR 77 | Temp 98.0°F | Resp 16 | Ht 59.0 in | Wt 149.0 lb

## 2014-03-03 DIAGNOSIS — M25562 Pain in left knee: Secondary | ICD-10-CM

## 2014-03-03 DIAGNOSIS — R51 Headache: Secondary | ICD-10-CM

## 2014-03-03 DIAGNOSIS — M25569 Pain in unspecified knee: Secondary | ICD-10-CM

## 2014-03-03 DIAGNOSIS — R519 Headache, unspecified: Secondary | ICD-10-CM

## 2014-03-03 LAB — POCT SEDIMENTATION RATE: POCT SED RATE: 28 mm/h — AB (ref 0–22)

## 2014-03-03 MED ORDER — VALACYCLOVIR HCL 1 G PO TABS: 1000.0000 mg | ORAL_TABLET | Freq: Three times a day (TID) | ORAL | Status: DC

## 2014-03-03 MED ORDER — HYDROCODONE-ACETAMINOPHEN 5-325 MG PO TABS: 0.5000 | ORAL_TABLET | Freq: Four times a day (QID) | ORAL | Status: DC | PRN
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
## 2014-03-03 NOTE — Patient Instructions (Addendum)
Your CT Scan of your head without contrast will be at [facility] at [place]. Go there now.   We will know the results of your blood test later today. We will also arrange the cat scan later today. Return to the clinic or go to the nearest emergency room if any of your symptoms worsen or new symptoms occur, including if any vision changes, vomiting, or rash on affected area. Hydrocodone only if needed for more severe pain, and use lowest effective dose - be careful with sedation or dizziness with this medicine.   For your knee, can apply ice or heat to area if needed, elevate as able when seated and recheck in next 3-4 days. (depending on cat scan, may be rechecking headache then as well.

## 2014-03-05 ENCOUNTER — Emergency Department (HOSPITAL_COMMUNITY): Payer: Medicare Other

## 2014-03-05 ENCOUNTER — Emergency Department (HOSPITAL_COMMUNITY)
Admission: EM | Admit: 2014-03-05 | Discharge: 2014-03-05 | Disposition: A | Discharge status: Home / Self Care | Payer: Medicare Other | Attending: Emergency Medicine | Admitting: Emergency Medicine

## 2014-03-05 ENCOUNTER — Encounter (HOSPITAL_COMMUNITY): Payer: Self-pay | Admitting: Emergency Medicine

## 2014-03-05 ENCOUNTER — Ambulatory Visit (INDEPENDENT_AMBULATORY_CARE_PROVIDER_SITE_OTHER): Payer: Medicare Other | Admitting: Family Medicine

## 2014-03-05 VITALS — BP 138/70 | HR 86 | Temp 98.3°F | Resp 20

## 2014-03-05 DIAGNOSIS — Z860100 Personal history of colon polyps, unspecified: Secondary | ICD-10-CM | POA: Insufficient documentation

## 2014-03-05 DIAGNOSIS — I6350 Cerebral infarction due to unspecified occlusion or stenosis of unspecified cerebral artery: Secondary | ICD-10-CM

## 2014-03-05 DIAGNOSIS — Z8719 Personal history of other diseases of the digestive system: Secondary | ICD-10-CM | POA: Insufficient documentation

## 2014-03-05 DIAGNOSIS — Z7982 Long term (current) use of aspirin: Secondary | ICD-10-CM | POA: Insufficient documentation

## 2014-03-05 DIAGNOSIS — R51 Headache: Secondary | ICD-10-CM | POA: Insufficient documentation

## 2014-03-05 DIAGNOSIS — R2689 Other abnormalities of gait and mobility: Secondary | ICD-10-CM

## 2014-03-05 DIAGNOSIS — R42 Dizziness and giddiness: Secondary | ICD-10-CM | POA: Insufficient documentation

## 2014-03-05 DIAGNOSIS — R29818 Other symptoms and signs involving the nervous system: Secondary | ICD-10-CM | POA: Diagnosis not present

## 2014-03-05 DIAGNOSIS — Z8659 Personal history of other mental and behavioral disorders: Secondary | ICD-10-CM | POA: Diagnosis not present

## 2014-03-05 DIAGNOSIS — Z87891 Personal history of nicotine dependence: Secondary | ICD-10-CM | POA: Insufficient documentation

## 2014-03-05 DIAGNOSIS — R519 Headache, unspecified: Secondary | ICD-10-CM

## 2014-03-05 DIAGNOSIS — Z8679 Personal history of other diseases of the circulatory system: Secondary | ICD-10-CM | POA: Diagnosis not present

## 2014-03-05 DIAGNOSIS — Z79899 Other long term (current) drug therapy: Secondary | ICD-10-CM | POA: Insufficient documentation

## 2014-03-05 DIAGNOSIS — Z8742 Personal history of other diseases of the female genital tract: Secondary | ICD-10-CM | POA: Diagnosis not present

## 2014-03-05 DIAGNOSIS — R6889 Other general symptoms and signs: Secondary | ICD-10-CM | POA: Diagnosis not present

## 2014-03-05 DIAGNOSIS — I6381 Other cerebral infarction due to occlusion or stenosis of small artery: Secondary | ICD-10-CM

## 2014-03-05 LAB — CBC WITH DIFFERENTIAL/PLATELET
BASOS PCT: 0 % (ref 0–1)
Basophils Absolute: 0 10*3/uL (ref 0.0–0.1)
Eosinophils Absolute: 0.1 10*3/uL (ref 0.0–0.7)
Eosinophils Relative: 1 % (ref 0–5)
HCT: 40.7 % (ref 36.0–46.0)
HEMOGLOBIN: 13.6 g/dL (ref 12.0–15.0)
LYMPHS ABS: 2.3 10*3/uL (ref 0.7–4.0)
Lymphocytes Relative: 25 % (ref 12–46)
MCH: 29.5 pg (ref 26.0–34.0)
MCHC: 33.4 g/dL (ref 30.0–36.0)
MCV: 88.3 fL (ref 78.0–100.0)
MONOS PCT: 8 % (ref 3–12)
Monocytes Absolute: 0.7 10*3/uL (ref 0.1–1.0)
NEUTROS ABS: 6.2 10*3/uL (ref 1.7–7.7)
NEUTROS PCT: 66 % (ref 43–77)
Platelets: 248 10*3/uL (ref 150–400)
RBC: 4.61 MIL/uL (ref 3.87–5.11)
RDW: 14.5 % (ref 11.5–15.5)
WBC: 9.2 10*3/uL (ref 4.0–10.5)

## 2014-03-05 LAB — COMPREHENSIVE METABOLIC PANEL
ALBUMIN: 4 g/dL (ref 3.5–5.2)
ALK PHOS: 55 U/L (ref 39–117)
ALT: 21 U/L (ref 0–35)
AST: 22 U/L (ref 0–37)
BUN: 14 mg/dL (ref 6–23)
CHLORIDE: 101 meq/L (ref 96–112)
CO2: 22 mEq/L (ref 19–32)
Calcium: 9.7 mg/dL (ref 8.4–10.5)
Creatinine, Ser: 0.61 mg/dL (ref 0.50–1.10)
GFR calc Af Amer: >90 mL/min (ref >90)
GFR calc non Af Amer: 88 mL/min — ABNORMAL LOW (ref >90)
GLUCOSE: 100 mg/dL — AB (ref 70–99)
POTASSIUM: 4.3 meq/L (ref 3.7–5.3)
SODIUM: 138 meq/L (ref 137–147)
Total Protein: 7.4 g/dL (ref 6.0–8.3)

## 2014-03-05 MED ORDER — GADOBENATE DIMEGLUMINE 529 MG/ML IV SOLN
15.0000 mL | Freq: Once | INTRAVENOUS | Status: AC
  Administered 2014-03-05: 13 mL via INTRAVENOUS

## 2014-03-05 MED ORDER — HYDROCODONE-ACETAMINOPHEN 5-325 MG PO TABS
1.0000 | ORAL_TABLET | Freq: Once | ORAL | Status: AC
  Administered 2014-03-05: 1 via ORAL
  Filled 2014-03-05: qty 1

## 2014-03-05 NOTE — Patient Instructions (Addendum)
After evaluation in the emergency room, if follow up needed - return here for evaluation.
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
## 2014-03-05 NOTE — ED Provider Notes (Signed)
CSN: [id number]     Arrival date & time 03/05/14  1511 History   First MD Initiated Contact with Patient 03/05/14 1515     Chief Complaint  Patient presents with  . Headache     (Consider location/radiation/quality/duration/timing/severity/associated sxs/prior Treatment) Patient is a 73 y.o. female presenting with headaches. The history is provided by the patient.  Headache Associated symptoms: dizziness   Associated symptoms: no abdominal pain, no back pain, no congestion, no fever, no nausea, no neck pain, no photophobia and no vomiting    patient with probable left-sided headache that sort of a shooting pain every day since April 29. Seen recently by her primary care Dr. at [facility] urgent care was started on Valtrex and hydrocodone thinking this could be shingles without a rash. Patient still having difficulties presents here for a second opinion. Has no photophobia is associated with some dizziness pain which shoots is very sharp 10 out of 10. No neck pain no facial numbness no upper lower extremity weakness or numbness. No low back pain. No fevers. No visual changes. No photophobia.  Past Medical History  Diagnosis Date  . Tobacco abuse   . Fibroid uterus   . History of D&C     x2  . Bruxism   . Colon polyps   . Diverticulosis of colon (without mention of hemorrhage)   . Unspecified hemorrhoids without mention of complication   . Irritable bowel syndrome    Past Surgical History  Procedure Laterality Date  . Fibroid removed from uterus    . Cholecystectomy  2009  . Bce    . Dilation and curettage of uterus    . Appendectomy  1957   Family History  Problem Relation Age of Onset  . Heart disease Mother     CHF  . Cancer Father     oral   History  Substance Use Topics  . Smoking status: Former Smoker -- 1.00 packs/day for 50 years    Types: Cigarettes    Quit date: 07/23/2008  . Smokeless tobacco: Never Used  . Alcohol Use: No   OB History   Grav Para Term Preterm  Abortions TAB SAB Ect Mult Living   2 2 2       2      Review of Systems  Constitutional: Negative for fever.  HENT: Negative for congestion.   Eyes: Negative for photophobia and visual disturbance.  Respiratory: Negative for shortness of breath.   Cardiovascular: Negative for chest pain.  Gastrointestinal: Negative for nausea, vomiting and abdominal pain.  Genitourinary: Negative for dysuria.  Musculoskeletal: Negative for back pain and neck pain.  Skin: Negative for rash.  Neurological: Positive for dizziness and headaches.  Hematological: Does not bruise/bleed easily.  Psychiatric/Behavioral: Negative for confusion.      Allergies  Biaxin and Codeine  Home Medications   Prior to Admission medications   Medication Sig Start Date End Date Taking? Authorizing Provider  aspirin 81 MG tablet Take 81 mg by mouth daily.     Yes Historical Provider, MD  b complex vitamins tablet Take 1 tablet by mouth daily.     Yes Historical Provider, MD  dicyclomine (BENTYL) 10 MG capsule Take 10 mg by mouth 4 (four) times daily as needed for spasms.   Yes Historical Provider, MD  HYDROcodone-acetaminophen (NORCO/VICODIN) 5-325 MG per tablet Take 0.5-1 tablets by mouth every 6 (six) hours as needed for moderate pain. 03/03/14  Yes Wendie Agreste, MD  ibuprofen (ADVIL,MOTRIN) 200 MG tablet  Take 400 mg by mouth every 6 (six) hours as needed for pain.   Yes Historical Provider, MD  metroNIDAZOLE (METROGEL) 0.75 % gel Apply 1 application topically at bedtime. Applies to face for rosacea   Yes Historical Provider, MD  OVER THE COUNTER MEDICATION Take 1 tablet by mouth every evening. Osteo matrix   Yes Historical Provider, MD  pyridOXINE (VITAMIN B-6) 100 MG tablet Take 100 mg by mouth daily.   Yes Historical Provider, MD  valACYclovir (VALTREX) 1000 MG tablet Take 1 tablet (1,000 mg total) by mouth 3 (three) times daily. 03/03/14  Yes Wendie Agreste, MD  vitamin B-12 (CYANOCOBALAMIN) 1000 MCG tablet Take  1,000 mcg by mouth daily.   Yes Historical Provider, MD  vitamin C (ASCORBIC ACID) 500 MG tablet Take 500 mg by mouth every evening.    Yes Historical Provider, MD  vitamin E (VITAMIN E) 400 UNIT capsule Take 400 Units by mouth daily.     Yes Historical Provider, MD  Wheat Dextrin (BENEFIBER) CHEW Chew 15 each by mouth 3 (three) times daily. 15 ml three times daily in tea   Yes Historical Provider, MD   BP 151/65  Pulse 83  Temp(Src) 99.4 F (37.4 C) (Oral)  Resp 18  SpO2 94% Physical Exam  Nursing note and vitals reviewed. Constitutional: She is oriented to person, place, and time. She appears well-developed and well-nourished. No distress.  HENT:  Head: Normocephalic and atraumatic.  Mouth/Throat: Oropharynx is clear and moist.  Eyes: Conjunctivae and EOM are normal. Pupils are equal, round, and reactive to light.  Neck: Normal range of motion. Neck supple.  Cardiovascular: Normal rate, regular rhythm and normal heart sounds.   Pulmonary/Chest: Effort normal and breath sounds normal.  Abdominal: Soft. Bowel sounds are normal. There is no tenderness.  Musculoskeletal: Normal range of motion.  Neurological: She is alert and oriented to person, place, and time. No cranial nerve deficit. She exhibits normal muscle tone. Coordination normal.  Skin: Skin is warm. No rash noted.    ED Course  Procedures (including critical care time) Labs Review Labs Reviewed  COMPREHENSIVE METABOLIC PANEL - Abnormal; Notable for the following:    Glucose, Bld 100 (*)    Total Bilirubin <0.2 (*)    GFR calc non Af Amer 88 (*)    All other components within normal limits  CBC WITH DIFFERENTIAL    Imaging Review Ct Head Wo Contrast  03/05/2014   CLINICAL DATA:  Left-sided headache.  EXAM: CT HEAD WITHOUT CONTRAST  TECHNIQUE: Contiguous axial images were obtained from the base of the skull through the vertex without intravenous contrast.  COMPARISON:  03/03/2014  FINDINGS: Focal lacunar infarct of the  anterior left thalamus shows stable appearance. The brain demonstrates no evidence of hemorrhage, acute infarction, edema, mass effect, extra-axial fluid collection, hydrocephalus or mass lesion. The skull is unremarkable.  IMPRESSION: No acute findings.  Stable left thalamic lacune.   Electronically Signed   By: Aletta Edouard M.D.   On: 03/05/2014 17:48   Mr Jeri Cos SW Contrast  03/05/2014   CLINICAL DATA:  Headache  EXAM: MRI HEAD WITHOUT AND WITH CONTRAST  TECHNIQUE: Multiplanar, multiecho pulse sequences of the brain and surrounding structures were obtained without and with intravenous contrast.  CONTRAST:  80mL MULTIHANCE GADOBENATE DIMEGLUMINE 529 MG/ML IV SOLN  COMPARISON:  CT head 03/05/2014  FINDINGS: Ventricle size is normal. Pituitary normal in size. Craniocervical junction is normal.  Chronic infarct left medial thalamus. Minimal changes in the  white matter consistent with chronic microvascular ischemia.  Negative for acute infarct.  Negative for hemorrhage or mass.  Normal enhancement following contrast infusion. No enhancing mass is identified. Leptomeningeal enhancement is normal.  Mild mucosal edema in the maxillary sinus bilaterally. Mild mucosal edema in the mastoid sinus bilaterally.  IMPRESSION: Chronic infarct left thalamus.  No acute abnormality.   Electronically Signed   By: Franchot Gallo M.D.   On: 03/05/2014 20:47     EKG Interpretation None      MDM   Final diagnoses:  Headache     . Patient with headache of unknown cause cause. Patient with negative head CT and MRI. We'll have her follow back up with her primary care Dr. give her referral to neurology. Possible that could be a single episode without a rash. Patient is already on acyclovir started by her primary care Dr. Has hydrocodone at home for pain. Patient improved here with hydrocodone. Seems to be effective. Patient is nontoxic no acute distress.    Mervin Kung, MD 03/05/14 [phone]

## 2014-03-05 NOTE — Progress Notes (Signed)
 Subjective:  This chart was scribed for Angel Holland by Angel Holland, [facility] Scribe. The patient was seen in room and the patient's care was started at 1:43 PM.  Patient ID: Angel Holland, female    DOB: 10/16/1941, 73 y.o.   MRN: [id number]  Headache  Associated symptoms include dizziness and eye pain.   HPI Comments: Angel Holland is a 73 y.o. female who arrives to the [facility] complaining of severe left sided head and eye pain.  Pt was seen at [facility] for left sided headache that radiates to her ear, onset one week PTA. Pt denies rash. SED rate was minimally elevated at 28. Pt was sent for a CT of her head. Pt had small aged indeterminate left thalamic lacunar- type infartion. No hemispheric infarction, intracranial hemorrage or mass. Discussed results with neurologist and we thought this was not an acute finding. Recommeded symptomatic care. Started patient on Valtrex to cover for early Zoster w/o rash and hydrocodone ever six hours as needed.   Pt complains today every 10-15 minutes, she has left sided "booming" head pain that is debilitating and severe. The pain initially radiated from the back of her eye and to her left ear, but those symptoms have improved. Now just to left side of head. Pt describes the pain as a "screw driver being crammed in to her head" in five seconds intervals and then subsides. The pain is worse with quickly turning her head. Pt is unable to pick anything off the floor due to dizziness and feeling of that she may pass out. Pt mentions sometimes after the initial few step after getting up from a sitting positions she feels off balance onset yesterday. Pt has pressure behind her eye. She explains she is functional outside of the episodes. Denies slurred speech, visual impairments, nausea and vomiting. Pt was dizzy when driving to [facility] but she speculates this may be due to feeling anxious and scared. She denies  taking any pain medication today. Denies any prior similar pain. Pt explains this is the worst pain she ever had. She takes hydrocodone at 9 pm and 3 am. Pt reports having some possible relief with Valtrex. Pt feels stressed from her husband recent cancer diagnosis and deciding on next step in his treatment, but has had stress headaches in the past that were very different from current HA and not as severe. She states she does not know how to relax. Pt reports falling in 2013 and hitting her head. Denies recent head injury or fall.     Review of Systems  Eyes: Positive for pain. Negative for visual disturbance.  Musculoskeletal: Positive for gait problem.  Neurological: Positive for dizziness and headaches. Negative for facial asymmetry and speech difficulty.       Near syncope  Psychiatric/Behavioral: The patient is nervous/anxious.        Objective:   Physical Exam  Nursing note and vitals reviewed. Constitutional: She is oriented to person, place, and time. She appears well-developed and well-nourished.  HENT:  Head: Normocephalic and atraumatic.  Eyes: Conjunctivae and EOM are normal. Pupils are equal, round, and reactive to light. Left eye exhibits no nystagmus.  Neck: Carotid bruit is not present.  Cardiovascular: Normal rate, regular rhythm, normal heart sounds and intact distal pulses.   Pulmonary/Chest: Effort normal and breath sounds normal.  Abdominal: Soft. She exhibits no pulsatile midline mass. There is no tenderness.  Neurological: She is alert and   oriented to person, place, and time.  Negative Romberg Negative Pronator Drift Equal face arm and leg strength  Equal palate elevation No focal weakness.  Reflex are 1+ but equal bilaterally.    Skin: Skin is warm and dry. No rash noted.  Psychiatric: She has a normal mood and affect. Her behavior is normal.  During pt's exam pain had 3-4 episode of headache where she brings her arms up and fleches her fists due to  discomfort.  Filed Vitals:   03/05/14 1336  BP: 138/70  Pulse: 86  Temp: 98.3 F (36.8 C)  TempSrc: Oral  Resp: 20  SpO2: 96%       Assessment & Plan:   Angel Holland is a 73 y.o. female Severe headache  Left-sided headache  Lacunar infarction  Dizziness  Balance problem  Severe L sided Headache that comes in waves, worst HA she has had. Reassuring ESR last ov so doubt temporal arteritis, and no rash so less likely Zoster. CT head 2 days ago did indicate lacunar infarct - thalamic region, but unknown timing, and in discussion with neuro, sx care discussed at that time. Now with balance difficulty at times, near syncope and persistent frequent severe HA's.  Will transport to ER by EMS for eval and to decide on furhter imaging if necessary.  Nonfocal neuro exam and [place] prehospital scale ok.  IV placed, EMS called and discussed with charge nurse at [facility].   No orders of the defined types were placed in this encounter.   Patient Instructions  After evaluation in the emergency room, if follow up needed - return here for evaluation.    I personally performed the services described in this documentation, which was scribed in my presence. The recorded information has been reviewed and considered, and addended by me as needed.   2:15 PM EMS called for transport non emergent and charge nurse was advised at [facility].  2:21 PMreport given to EMS, transfer of care.  

## 2014-03-05 NOTE — Discharge Instructions (Signed)
°  A head CT and MRI without any significant findings of the brain. I recommend continue current medication followup with your doctor in the next few days as needed. Also recommend neurology followup referral information provided above. Return for any newer worse symptoms.

## 2014-03-12 ENCOUNTER — Other Ambulatory Visit: Payer: Self-pay | Admitting: Family Medicine

## 2014-03-24 ENCOUNTER — Ambulatory Visit (INDEPENDENT_AMBULATORY_CARE_PROVIDER_SITE_OTHER): Payer: Medicare Other | Admitting: Neurology

## 2014-03-24 ENCOUNTER — Encounter: Payer: Self-pay | Admitting: Neurology

## 2014-03-24 VITALS — BP 136/80 | HR 77 | Ht 77.0 in | Wt 151.0 lb

## 2014-03-24 DIAGNOSIS — M316 Other giant cell arteritis: Secondary | ICD-10-CM | POA: Diagnosis not present

## 2014-03-24 DIAGNOSIS — G44039 Episodic paroxysmal hemicrania, not intractable: Secondary | ICD-10-CM | POA: Diagnosis not present

## 2014-03-24 DIAGNOSIS — I6350 Cerebral infarction due to unspecified occlusion or stenosis of unspecified cerebral artery: Secondary | ICD-10-CM

## 2014-03-24 HISTORY — DX: Episodic paroxysmal hemicrania, not intractable: G44.039

## 2014-03-24 LAB — SEDIMENTATION RATE: SED RATE: 10 mm/h (ref 0–40)
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
## 2014-03-24 NOTE — Patient Instructions (Signed)
I had a long discussion with the patient with regards to her paroxysmal left hemicranial headaches discussed differential diagnosis and answered questions. Since the headaches have resolved I do not believe specific treatment is indicated. Check ESR and ultrasound of the temporal artery for temporal arteritis. Return for followup in 2 months for call earlier if necessary.

## 2014-03-30 ENCOUNTER — Ambulatory Visit (INDEPENDENT_AMBULATORY_CARE_PROVIDER_SITE_OTHER): Payer: Medicare Other | Admitting: Family Medicine

## 2014-03-30 ENCOUNTER — Encounter: Payer: Self-pay | Admitting: Family Medicine

## 2014-03-30 VITALS — BP 110/78 | HR 73 | Temp 98.3°F | Wt 151.0 lb

## 2014-03-30 DIAGNOSIS — I6350 Cerebral infarction due to unspecified occlusion or stenosis of unspecified cerebral artery: Secondary | ICD-10-CM

## 2014-03-30 DIAGNOSIS — Z8719 Personal history of other diseases of the digestive system: Secondary | ICD-10-CM | POA: Diagnosis not present

## 2014-03-30 DIAGNOSIS — Z7189 Other specified counseling: Secondary | ICD-10-CM

## 2014-03-30 DIAGNOSIS — Z7689 Persons encountering health services in other specified circumstances: Secondary | ICD-10-CM

## 2014-03-30 DIAGNOSIS — R918 Other nonspecific abnormal finding of lung field: Secondary | ICD-10-CM

## 2014-03-30 DIAGNOSIS — G44039 Episodic paroxysmal hemicrania, not intractable: Secondary | ICD-10-CM

## 2014-03-30 HISTORY — DX: Personal history of other diseases of the digestive system: Z87.19

## 2014-03-30 NOTE — Progress Notes (Addendum)
No chief complaint on file.   HPI:  Angel Holland is here to establish care.  Last PCP and physical: used to see gyn, but does not want to see them anymore  Has the following chronic problems and concerns today:  Patient Active Problem List   Diagnosis Date Noted  . History of IBS - followed by [facility] 03/30/2014  . Headache, paroxysmal hemicrania, episodic 03/24/2014  . Pulmonary nodules - reports followed by pulmonology, reports following up with them in June 2015 03/26/2013   IBS: -hx of nausea and diarrhea and anxiety -seeing GI -no blood in stools or sig weight loss  Severe unilateral L headache: -in April and early may 2015, s/p CT and MRI - ok -tx in urgent care with pain medication and valtrex and resolved -denies recent falls, recent injury, vision change, fever  Pulm nodules: -found incidentally 01/2013 -has follow up with pulm -no CP, SOB, cough, hemoptysis  Health Maintenance:  -wants physical  ROS: See pertinent positives and negatives per HPI.  Past Medical History  Diagnosis Date  . Tobacco abuse   . Fibroid uterus   . History of D&C     x2  . Bruxism   . Colon polyps   . Diverticulosis of colon (without mention of hemorrhage)   . Unspecified hemorrhoids without mention of complication   . Irritable bowel syndrome   . Hx of diverticulitis of colon 03/30/2014    Family History  Problem Relation Age of Onset  . Heart disease Mother     CHF  . Cancer Father     oral    History   Social History  . Marital Status: Married    Spouse Name: N/A    Number of Children: 2  . Years of Education: N/A   Occupational History  . retired     Press photographer   Social History Main Topics  . Smoking status: Former Smoker -- 1.00 packs/day for 50 years    Types: Cigarettes    Quit date: 07/23/2008  . Smokeless tobacco: Never Used  . Alcohol Use: No  . Drug Use: No  . Sexual Activity: Not Currently   Other Topics Concern  . 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  , Disp: , Rfl: ;  calcium & magnesium carbonates (MYLANTA) 311-232 MG per tablet, Take 1 tablet by mouth daily., Disp: , Rfl: ;  dicyclomine (BENTYL) 10 MG capsule, Take 10 mg by mouth 4 (four) times daily as needed for spasms., Disp: , Rfl: ;  ibuprofen (ADVIL,MOTRIN) 200 MG tablet, Take 400 mg by mouth every 6 (six) hours as needed for pain., Disp: , Rfl:  metroNIDAZOLE (METROGEL) 0.75 % gel, Apply 1 application topically at bedtime. Applies to face for rosacea, Disp: , Rfl: ;  pyridOXINE (VITAMIN B-6) 100 MG tablet, Take 100 mg by mouth daily., Disp: , Rfl: ;  vitamin B-12 (CYANOCOBALAMIN) 1000 MCG tablet, Take 1,000 mcg by mouth daily., Disp: , Rfl: ;  vitamin C (ASCORBIC ACID) 500 MG tablet, Take 500 mg by mouth every  evening. , Disp: , Rfl:  vitamin E (VITAMIN E) 400 UNIT capsule, Take 400 Units by mouth daily.  , Disp: , Rfl: ;  Wheat Dextrin (BENEFIBER) CHEW, Chew 15 each by mouth 3 (three) times daily. 15 ml three times daily in tea, Disp: , Rfl:   EXAM:  Filed Vitals:   03/30/14 1047  BP: 110/78  Pulse: 73  Temp: 98.3 F (36.8 C)    Body mass index is 17.9 kg/(m^2).  GENERAL: vitals reviewed and listed above, alert, oriented, appears well hydrated and in no acute distress  HEENT: atraumatic, conjunttiva clear, no obvious abnormalities on inspection of external nose and ears  NECK: no obvious masses on inspection  LUNGS: clear to auscultation bilaterally, no wheezes, rales or rhonchi, good air movement  CV: HRRR, no peripheral edema  MS: moves all extremities without noticeable abnormality  PSYCH: pleasant and cooperative, no obvious depression or  anxiety  ASSESSMENT AND PLAN:  Discussed the following assessment and plan:  History of IBS - followed by [facility]  Hx of diverticulitis of colon  Episodic paroxysmal hemicrania, not intractable  Pulmonary nodules - reports followed by pulmonology, reports following up with them in June 2015  Encounter to establish care  -We reviewed the PMH, PSH, FH, SH, Meds and Allergies. -We provided refills for any medications we will prescribe as needed. -We addressed current concerns per orders and patient instructions. -We have asked for records for pertinent exams, studies, vaccines and notes from previous providers. -We have advised patient to follow up per instructions below. -follow up for MEDICARE EXAM   -Patient advised to return or notify a doctor immediately if symptoms worsen or persist or new concerns arise.  Patient Instructions  -follow up with your pulmonologist  -We have ordered labs or studies at this visit. It can take up to 1-2 weeks for results and processing. We will contact you with instructions IF your results are abnormal. Normal results will be released to your [facility]. If you have not heard from [place] or can not find your results in [facility] in 2 weeks please contact our office.  -PLEASE SIGN UP FOR MYCHART TODAY   We recommend the following healthy lifestyle measures: - eat a healthy diet consisting of lots of vegetables, fruits, beans, nuts, seeds, healthy meats such as white chicken and fish and whole grains.  - avoid fried foods, fast food, processed foods, sodas, red meet and other fattening foods.  - get a least 150 minutes of aerobic exercise per week.   Follow up in: for MEDICARE annual exam      Lucretia Kern.

## 2014-03-30 NOTE — Patient Instructions (Addendum)
-  follow up with your pulmonologist  -We have ordered labs or studies at this visit. It can take up to 1-2 weeks for results and processing. We will contact you with instructions IF your results are abnormal. Normal results will be released to your [facility]. If you have not heard from [place] or can not find your results in [facility] in 2 weeks please contact our office.  -PLEASE SIGN UP FOR MYCHART TODAY   We recommend the following healthy lifestyle measures: - eat a healthy diet consisting of lots of vegetables, fruits, beans, nuts, seeds, healthy meats such as white chicken and fish and whole grains.  - avoid fried foods, fast food, processed foods, sodas, red meet and other fattening foods.  - get a least 150 minutes of aerobic exercise per week.   Follow up in: for MEDICARE annual exam

## 2014-03-30 NOTE — Progress Notes (Signed)
Pre visit review using our clinic review tool, if applicable. No additional management support is needed unless otherwise documented below in the visit note. 

## 2014-04-02 ENCOUNTER — Other Ambulatory Visit (HOSPITAL_COMMUNITY): Payer: Self-pay | Admitting: Neurology

## 2014-04-02 ENCOUNTER — Telehealth: Payer: Self-pay | Admitting: Radiology

## 2014-04-02 DIAGNOSIS — M316 Other giant cell arteritis: Secondary | ICD-10-CM

## 2014-04-08 ENCOUNTER — Encounter: Payer: Self-pay | Admitting: Obstetrics and Gynecology

## 2014-04-08 ENCOUNTER — Other Ambulatory Visit: Payer: Self-pay | Admitting: Obstetrics and Gynecology

## 2014-04-08 ENCOUNTER — Ambulatory Visit (HOSPITAL_COMMUNITY)
Admission: RE | Admit: 2014-04-08 | Discharge: 2014-04-08 | Disposition: A | Discharge status: Home / Self Care | Payer: Medicare Other | Source: Ambulatory Visit | Attending: Family Medicine | Admitting: Family Medicine

## 2014-04-08 DIAGNOSIS — R51 Headache: Secondary | ICD-10-CM | POA: Diagnosis not present

## 2014-04-08 DIAGNOSIS — M316 Other giant cell arteritis: Secondary | ICD-10-CM

## 2014-04-08 NOTE — Progress Notes (Signed)
*  PRELIMINARY RESULTS* Vascular Ultrasound Limited study- Temporal artery duplex has been completed.  Preliminary findings: No evidence of Halo sign to suggest temporal arteritis.    Landry Mellow, RDMS, RVT  04/08/2014, 10:33 AM

## 2014-04-17 ENCOUNTER — Ambulatory Visit (INDEPENDENT_AMBULATORY_CARE_PROVIDER_SITE_OTHER): Payer: Medicare Other | Admitting: Internal Medicine

## 2014-04-17 ENCOUNTER — Encounter: Payer: Self-pay | Admitting: Internal Medicine

## 2014-04-17 VITALS — BP 136/80 | HR 79 | Temp 97.8°F | Ht <= 58 in | Wt 151.0 lb

## 2014-04-17 DIAGNOSIS — R918 Other nonspecific abnormal finding of lung field: Secondary | ICD-10-CM

## 2014-04-17 DIAGNOSIS — I6350 Cerebral infarction due to unspecified occlusion or stenosis of unspecified cerebral artery: Secondary | ICD-10-CM | POA: Diagnosis not present

## 2014-04-17 NOTE — Progress Notes (Signed)
Subjective:    Patient ID: Angel Holland, female    DOB: 18-Nov-1940   MRN: [id number]  HPI 73 yo former smoker (11 pk-yrs), hx uterine fibroids, diverticular disease. Underwent CT abd in April '14 for abd pain, diarrhea, nausea. Has seen Dr Sharlett Iles. The scan identified two RML nodules - 70mm and 29mm.  She presents for follow up of the nodules. She is not having any pulm symptoms, but on further questioning she does notice exertional SOB.   ROV 05/01/13 -- hx tobacco, CT scan 4/14 with small pulm nodules. Repeat scan 04/22/13 shows no interval change.  PFT done today > possible borderline AFL based on curve. She denies SOB, but she is having a lot of trouble with her abd cramping and diverticulitis.  rec  rescan in 12 m    04/17/2014 f/u ov/Wert re: MPNs Chief Complaint  Patient presents with  . Follow-up    1 year ROV. Pt reports daily exercise when weather permits. No complaints.       No obvious day to day or daytime variabilty or assoc chronic cough or cp or chest tightness, subjective wheeze overt sinus or hb symptoms. No unusual exp hx or h/o childhood pna/ asthma or knowledge of premature birth.  Sleeping ok without nocturnal  or early am exacerbation  of respiratory  c/o's or need for noct saba. Also denies any obvious fluctuation of symptoms with weather or environmental changes or other aggravating or alleviating factors except as outlined above   Current Medications, Allergies, Complete Past Medical History, Past Surgical History, Family History, and Social History were reviewed in [employer] record.  ROS  The following are not active complaints unless bolded sore throat, dysphagia, dental problems, itching, sneezing,  nasal congestion or excess/ purulent secretions, ear ache,   fever, chills, sweats, unintended wt loss, pleuritic or exertional cp, hemoptysis,  orthopnea pnd or leg swelling, presyncope, palpitations, heartburn, abdominal pain, anorexia,  nausea, vomiting, diarrhea  or change in bowel or urinary habits, change in stools or urine, dysuria,hematuria,  rash, arthralgias, visual complaints, headache, numbness weakness or ataxia or problems with walking or coordination,  change in mood/affect or memory.                Objective:   Physical Exam   Wt Readings from Last 3 Encounters:  04/17/14 151 lb (68.493 kg)  03/30/14 151 lb (68.493 kg)  03/24/14 151 lb (68.493 kg)      Gen: Pleasant, well-nourished, in no distress,  normal affect  ENT: No lesions,  mouth clear,  oropharynx clear, no postnasal drip  Neck: No JVD, no TMG, no carotid bruits  Lungs: No use of accessory muscles, no dullness to percussion, clear without rales or rhonchi  Cardiovascular: RRR, heart sounds normal, no murmur or gallops, no peripheral edema  Musculoskeletal: No deformities, no cyanosis or clubbing  Neuro: alert, non focal  Skin: Warm, no lesions or rashes  04/22/13 --  Comparison: CT of the abdomen and pelvis 02/15/2013.  Findings:  Mediastinum: Heart size is normal. There is no significant  pericardial fluid, thickening or pericardial calcification. There  is atherosclerosis of the thoracic aorta, the great vessels of the  mediastinum and the coronary arteries, including calcified  atherosclerotic plaque in the left main, left anterior descending,  left circumflex and right coronary arteries. No pathologically  enlarged mediastinal or hilar lymph nodes. Please note that  accurate exclusion of hilar adenopathy is limited on noncontrast CT  scans. Esophagus is  unremarkable in appearance.  Lungs/Pleura: There are a few scattered tiny pulmonary nodules in  the right lung, largest of which are in the right middle lobe  measuring 4 mm (image 34 series 3), 3 mm (image 35 of series 3) and  3 mm (image 33 of series 3). No other larger more suspicious  appearing pulmonary nodules or masses are otherwise noted. No  acute consolidative air  space disease. Very mild centrilobular  emphysema is also noted. No pleural effusions.  Upper Abdomen: Unremarkable.  Musculoskeletal: There are no aggressive appearing lytic or blastic  lesions noted in the visualized portions of the skeleton.  IMPRESSION:  1. There are a few tiny nonspecific pulmonary nodules in the right  lung, with the largest measuring only 4 mm in the right middle  lobe. Given the smoking related changes in the lungs (mild  centrilobular emphysema), the patient is at increased risk for  bronchogenic carcinoma, so a follow-up chest CT at 1 year is  recommended. This recommendation follows the consensus statement:  Guidelines for Management of Small Pulmonary Nodules Detected on CT  Scans: A Statement from the Fleischner Society as published in  Radiology 2005; 237:395-400.  2. Atherosclerosis, including left main and three-vessel coronary  artery disease. Assessment for potential risk factor modification,  dietary therapy or pharmacologic therapy may be warranted, if  clinically indicated.      Assessment & Plan:

## 2014-04-17 NOTE — Patient Instructions (Signed)
Please see patient coordinator before you leave today  to schedule CT chest anytime this summer at your convenience

## 2014-04-18 NOTE — Assessment & Plan Note (Addendum)
Chart and ct reviewed - agree with Dr Lamonte Sakai she is low but not no risk and will need CT chest for apples to apples comparison with previous studies as these are very small nodules> Discussed in detail all the  indications, usual  risks and alternatives  relative to the benefits with patient who agrees to proceed with repeat fob and consideration for excisional bx if any are showing growth

## 2014-04-20 ENCOUNTER — Ambulatory Visit (INDEPENDENT_AMBULATORY_CARE_PROVIDER_SITE_OTHER)
Admission: RE | Admit: 2014-04-20 | Discharge: 2014-04-20 | Disposition: A | Discharge status: Home / Self Care | Payer: Medicare Other | Source: Ambulatory Visit | Attending: Internal Medicine | Admitting: Internal Medicine

## 2014-04-20 DIAGNOSIS — R918 Other nonspecific abnormal finding of lung field: Secondary | ICD-10-CM | POA: Diagnosis not present

## 2014-04-20 DIAGNOSIS — J984 Other disorders of lung: Secondary | ICD-10-CM | POA: Diagnosis not present

## 2014-04-21 ENCOUNTER — Encounter: Payer: Self-pay | Admitting: Internal Medicine

## 2014-04-21 NOTE — Progress Notes (Signed)
Quick Note:  Spoke with pt and notified of results per Dr. Wert. Pt verbalized understanding and denied any questions.  ______ 

## 2014-05-04 DIAGNOSIS — IMO0002 Reserved for concepts with insufficient information to code with codable children: Secondary | ICD-10-CM | POA: Diagnosis not present

## 2014-05-04 DIAGNOSIS — M1710 Unilateral primary osteoarthritis, unspecified knee: Secondary | ICD-10-CM | POA: Diagnosis not present

## 2014-07-01 ENCOUNTER — Ambulatory Visit: Payer: Medicare Other | Admitting: Neurology

## 2014-07-01 ENCOUNTER — Ambulatory Visit: Payer: Medicare Other | Admitting: Nurse Practitioner

## 2014-07-02 ENCOUNTER — Ambulatory Visit (INDEPENDENT_AMBULATORY_CARE_PROVIDER_SITE_OTHER)
Admission: RE | Admit: 2014-07-02 | Discharge: 2014-07-02 | Disposition: A | Discharge status: Home / Self Care | Payer: Medicare Other | Source: Ambulatory Visit | Attending: Family Medicine | Admitting: Family Medicine

## 2014-07-02 ENCOUNTER — Encounter: Payer: Self-pay | Admitting: Family Medicine

## 2014-07-02 ENCOUNTER — Ambulatory Visit (INDEPENDENT_AMBULATORY_CARE_PROVIDER_SITE_OTHER): Payer: Medicare Other | Admitting: Family Medicine

## 2014-07-02 VITALS — BP 134/88 | HR 76 | Temp 98.3°F | Ht 59.25 in | Wt 149.0 lb

## 2014-07-02 DIAGNOSIS — M546 Pain in thoracic spine: Secondary | ICD-10-CM

## 2014-07-02 DIAGNOSIS — E785 Hyperlipidemia, unspecified: Secondary | ICD-10-CM

## 2014-07-02 DIAGNOSIS — R739 Hyperglycemia, unspecified: Secondary | ICD-10-CM

## 2014-07-02 DIAGNOSIS — Z0000 Encounter for general adult medical examination without abnormal findings: Secondary | ICD-10-CM

## 2014-07-02 DIAGNOSIS — R918 Other nonspecific abnormal finding of lung field: Secondary | ICD-10-CM

## 2014-07-02 DIAGNOSIS — M25569 Pain in unspecified knee: Secondary | ICD-10-CM | POA: Diagnosis not present

## 2014-07-02 DIAGNOSIS — Z13220 Encounter for screening for lipoid disorders: Secondary | ICD-10-CM | POA: Diagnosis not present

## 2014-07-02 DIAGNOSIS — R911 Solitary pulmonary nodule: Secondary | ICD-10-CM | POA: Diagnosis not present

## 2014-07-02 DIAGNOSIS — R7309 Other abnormal glucose: Secondary | ICD-10-CM | POA: Diagnosis not present

## 2014-07-02 DIAGNOSIS — IMO0002 Reserved for concepts with insufficient information to code with codable children: Secondary | ICD-10-CM | POA: Diagnosis not present

## 2014-07-02 DIAGNOSIS — M5137 Other intervertebral disc degeneration, lumbosacral region: Secondary | ICD-10-CM | POA: Diagnosis not present

## 2014-07-02 DIAGNOSIS — M25562 Pain in left knee: Secondary | ICD-10-CM

## 2014-07-02 DIAGNOSIS — M4120 Other idiopathic scoliosis, site unspecified: Secondary | ICD-10-CM | POA: Diagnosis not present

## 2014-07-02 LAB — BASIC METABOLIC PANEL
BUN: 14 mg/dL (ref 6–23)
CO2: 27 mEq/L (ref 19–32)
CREATININE: 0.8 mg/dL (ref 0.4–1.2)
Calcium: 9.6 mg/dL (ref 8.4–10.5)
Chloride: 103 mEq/L (ref 96–112)
GFR: 80.35 mL/min (ref >60.00)
Glucose, Bld: 119 mg/dL — ABNORMAL HIGH (ref 70–99)
POTASSIUM: 4.2 meq/L (ref 3.5–5.1)
Sodium: 140 mEq/L (ref 135–145)

## 2014-07-02 LAB — LIPID PANEL
CHOL/HDL RATIO: 5
Cholesterol: 170 mg/dL (ref 0–200)
HDL: 37.2 mg/dL — ABNORMAL LOW (ref >39.00)
LDL Cholesterol: 111 mg/dL — ABNORMAL HIGH (ref 0–99)
NONHDL: 132.8
Triglycerides: 110 mg/dL (ref 0.0–149.0)
VLDL: 22 mg/dL (ref 0.0–40.0)

## 2014-07-02 NOTE — Patient Instructions (Signed)
FOR the back pain: -heat for 15 minutes twice daily -tylenol 500-1000mg  up to 3 times daily -exercises provided at least 4-5 days per week -go get the xrays  For knee pain: -call your knee doctor  -We have ordered labs or studies at this visit. It can take up to 1-2 weeks for results and processing. We will contact you with instructions IF your results are abnormal. Normal results will be released to your [facility]. If you have not heard from [place] or can not find your results in [facility] in 2 weeks please contact our office.  -PLEASE SIGN UP FOR MYCHART TODAY   We recommend the following healthy lifestyle measures: - eat a healthy diet consisting of lots of vegetables, fruits, beans, nuts, seeds, healthy meats such as white chicken and fish and whole grains.  - avoid fried foods, fast food, processed foods, sodas, red meet and other fattening foods.  - get a least 150 minutes of aerobic exercise per week.   Follow up in: 1 month for the back pain
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
## 2014-07-02 NOTE — Progress Notes (Signed)
Pre visit review using our clinic review tool, if applicable. No additional management support is needed unless otherwise documented below in the visit note. 

## 2014-07-30 ENCOUNTER — Ambulatory Visit (INDEPENDENT_AMBULATORY_CARE_PROVIDER_SITE_OTHER): Payer: Medicare Other | Admitting: Family Medicine

## 2014-07-30 ENCOUNTER — Encounter: Payer: Self-pay | Admitting: Family Medicine

## 2014-07-30 VITALS — BP 128/74 | HR 77 | Temp 98.2°F | Ht 59.25 in | Wt 150.0 lb

## 2014-07-30 DIAGNOSIS — E785 Hyperlipidemia, unspecified: Secondary | ICD-10-CM | POA: Diagnosis not present

## 2014-07-30 DIAGNOSIS — R739 Hyperglycemia, unspecified: Secondary | ICD-10-CM

## 2014-07-30 DIAGNOSIS — I639 Cerebral infarction, unspecified: Secondary | ICD-10-CM

## 2014-07-30 DIAGNOSIS — E669 Obesity, unspecified: Secondary | ICD-10-CM

## 2014-07-30 DIAGNOSIS — F32A Depression, unspecified: Secondary | ICD-10-CM

## 2014-07-30 DIAGNOSIS — F329 Major depressive disorder, single episode, unspecified: Secondary | ICD-10-CM

## 2014-07-30 DIAGNOSIS — M546 Pain in thoracic spine: Secondary | ICD-10-CM | POA: Diagnosis not present

## 2014-07-30 DIAGNOSIS — H6982 Other specified disorders of Eustachian tube, left ear: Secondary | ICD-10-CM

## 2014-07-30 DIAGNOSIS — F320 Major depressive disorder, single episode, mild: Secondary | ICD-10-CM

## 2014-07-30 NOTE — Patient Instructions (Signed)
For the ear, try Nasacort daily for one month  Consider counseling for depression  Continue the back exercises  Work on a healthy diet and at least 150 minutes of exercise daily  Follow up in 3-4 months, morning appointment, come fasting
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
## 2014-07-30 NOTE — Progress Notes (Signed)
Pre visit review using our clinic review tool, if applicable. No additional management support is needed unless otherwise documented below in the visit note. 

## 2014-08-31 ENCOUNTER — Encounter: Payer: Self-pay | Admitting: Family Medicine

## 2014-09-11 ENCOUNTER — Other Ambulatory Visit: Payer: Self-pay

## 2014-09-11 DIAGNOSIS — L814 Other melanin hyperpigmentation: Secondary | ICD-10-CM | POA: Diagnosis not present

## 2014-09-11 DIAGNOSIS — Z85828 Personal history of other malignant neoplasm of skin: Secondary | ICD-10-CM | POA: Diagnosis not present

## 2014-09-11 DIAGNOSIS — L821 Other seborrheic keratosis: Secondary | ICD-10-CM | POA: Diagnosis not present

## 2014-09-11 DIAGNOSIS — L919 Hypertrophic disorder of the skin, unspecified: Secondary | ICD-10-CM | POA: Diagnosis not present

## 2014-09-11 DIAGNOSIS — D485 Neoplasm of uncertain behavior of skin: Secondary | ICD-10-CM | POA: Diagnosis not present

## 2014-09-11 DIAGNOSIS — C44319 Basal cell carcinoma of skin of other parts of face: Secondary | ICD-10-CM | POA: Diagnosis not present

## 2014-11-02 ENCOUNTER — Ambulatory Visit (INDEPENDENT_AMBULATORY_CARE_PROVIDER_SITE_OTHER): Payer: Medicare Other | Admitting: Family Medicine

## 2014-11-02 ENCOUNTER — Encounter: Payer: Self-pay | Admitting: Family Medicine

## 2014-11-02 VITALS — BP 138/76 | HR 80 | Temp 97.7°F | Ht 59.0 in | Wt 148.7 lb

## 2014-11-02 DIAGNOSIS — M545 Low back pain: Secondary | ICD-10-CM | POA: Diagnosis not present

## 2014-11-02 DIAGNOSIS — F329 Major depressive disorder, single episode, unspecified: Secondary | ICD-10-CM

## 2014-11-02 DIAGNOSIS — Z23 Encounter for immunization: Secondary | ICD-10-CM | POA: Diagnosis not present

## 2014-11-02 DIAGNOSIS — R739 Hyperglycemia, unspecified: Secondary | ICD-10-CM

## 2014-11-02 DIAGNOSIS — E785 Hyperlipidemia, unspecified: Secondary | ICD-10-CM

## 2014-11-02 DIAGNOSIS — F32A Depression, unspecified: Secondary | ICD-10-CM

## 2014-11-02 LAB — LIPID PANEL
Cholesterol: 154 mg/dL (ref 0–200)
HDL: 41 mg/dL (ref >39.00)
LDL Cholesterol: 93 mg/dL (ref 0–99)
NONHDL: 113
Total CHOL/HDL Ratio: 4
Triglycerides: 99 mg/dL (ref 0.0–149.0)
VLDL: 19.8 mg/dL (ref 0.0–40.0)

## 2014-11-02 LAB — HEMOGLOBIN A1C: Hgb A1c MFr Bld: 6.8 % — ABNORMAL HIGH (ref 4.6–6.5)

## 2014-11-02 NOTE — Progress Notes (Signed)
HPI:  Mild dyslipidemia and mildly elevated Glu on labs: -she does not want to take medications and would rather work on diet -walking,  -denies: polyuria, polydipsia  Depression: -positive depression screen by assistant -reports mildly depressed her whole life, does not want to take medications -cares for husband whom has lung cancer and whom had stroke Sleep disorder: yes Interest deficit/anhedonia: no Guilt (worthlessness, hopelessness, regret): yes Energy deficit: no Concentration deficit: no Appetite disorder: no Psychomotor retardation or agitation: no Suicidality: no  Bilat low back pain: -chronic, intermittent flare -she wants back exercises for her low back -reports thoracic pain resolved with the HEP -denies: radiation, numbness, weakness, bowel of bladder dysfunction  ROS: See pertinent positives and negatives per HPI.  Past Medical History  Diagnosis Date  . Tobacco abuse   . Fibroid uterus   . History of D&C     x2  . Bruxism   . Colon polyps   . Diverticulosis of colon (without mention of hemorrhage)   . Unspecified hemorrhoids without mention of complication   . Irritable bowel syndrome   . Hx of diverticulitis of colon 03/30/2014    Past Surgical History  Procedure Laterality Date  . Fibroid removed from uterus    . Cholecystectomy  2009  . Bce    . Dilation and curettage of uterus    . Appendectomy  1957    Family History  Problem Relation Age of Onset  . Heart disease Mother     CHF  . Cancer Father     oral    History   Social History  . Marital Status: Married    Spouse Name: N/A    Number of Children: 2  . Years of Education: N/A   Occupational History  . retired     Press photographer   Social History Main Topics  . Smoking status: Former Smoker -- 1.00 packs/day for 50 years    Types: Cigarettes    Quit date: 07/23/2008  . Smokeless tobacco: Never Used  . Alcohol Use: No  . Drug Use: No  . Sexual Activity: Not Currently    Other Topics Concern  . 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  , Disp: , Rfl: ;  B Complex Vitamins (B COMPLEX PO), Take by mouth., Disp: , Rfl: ;  metroNIDAZOLE (METROGEL) 0.75 % gel, Apply 1 application topically at bedtime. Applies to face for rosacea, Disp: , Rfl: ;  NON FORMULARY, Osteonatrix, Disp: , Rfl: ;  Pyridoxine HCl (VITAMIN B6 PO), Take by mouth., Disp: , Rfl:  vitamin C (ASCORBIC ACID) 500 MG tablet, Take 500 mg by mouth every evening. , Disp: , Rfl: ;  vitamin E (VITAMIN E) 400 UNIT capsule, Take 400 Units by mouth daily.  , Disp: , Rfl: ;  Wheat Dextrin (BENEFIBER) CHEW, Chew 15 each by mouth 3 (three) times daily. 15 ml three times daily in tea, Disp: , Rfl:   EXAM:  Filed Vitals:   11/02/14 0849  BP: 138/76  Pulse: 80  Temp: 97.7 F (36.5 C)  Body mass index is 30.02 kg/(m^2).  GENERAL: vitals reviewed and listed above, alert, oriented, appears well hydrated and in no acute distress  HEENT: atraumatic, conjunttiva clear, no obvious abnormalities on inspection of external nose and ears  NECK: no obvious masses on inspection  LUNGS: clear to auscultation bilaterally, no wheezes, rales or rhonchi, good air movement  CV: HRRR, no peripheral edema  MS: moves all extremities without noticeable abnormality  Normal Gait Normal inspection of back, no obvious scoliosis or leg length descrepancy No bony TTP Soft tissue TTP at: bilat lumbar paraspinal muscles -/+ tests: neg trendelenburg,-facet loading, -SLRT, -CLRT, -FABER, -FADIR Normal muscle strength, sensation to light touch and DTRs in LEs bilaterally  PSYCH: pleasant and cooperative, no obvious  depression or anxiety  ASSESSMENT AND PLAN:  Discussed the following assessment and plan:  Hyperlipemia - Plan: Lipid Panel  Hyperglycemia - Plan: Hemoglobin A1c  Low back pain without sciatica, unspecified back pain laterality -HEP, supportive care -follow up 1 month  Depression -stable, she does not want treatment -Patient advised to return or notify a doctor immediately if symptoms worsen or persist or new concerns arise.  Patient Instructions  BEFORE YOU LEAVE: -low back exercises -schedule follow up in 1 month -labs  Do the exercises for the low back at least 4 days per week and follow up in 1 month     Angel Holland, [place]

## 2014-11-02 NOTE — Patient Instructions (Addendum)
BEFORE YOU LEAVE: -low back exercises -schedule follow up in 1 month -labs  Do the exercises for the low back at least 4 days per week and follow up in 1 month

## 2014-11-02 NOTE — Addendum Note (Signed)
Addended by: Agnes Lawrence on: 11/02/2014 09:54 AM   Modules accepted: Orders

## 2014-11-02 NOTE — Progress Notes (Signed)
Pre visit review using our clinic review tool, if applicable. No additional management support is needed unless otherwise documented below in the visit note. 

## 2014-11-05 DIAGNOSIS — Z85828 Personal history of other malignant neoplasm of skin: Secondary | ICD-10-CM | POA: Diagnosis not present

## 2014-11-05 DIAGNOSIS — C4401 Basal cell carcinoma of skin of lip: Secondary | ICD-10-CM | POA: Diagnosis not present

## 2014-12-03 ENCOUNTER — Ambulatory Visit (INDEPENDENT_AMBULATORY_CARE_PROVIDER_SITE_OTHER): Payer: Medicare Other | Admitting: Family Medicine

## 2014-12-03 ENCOUNTER — Encounter: Payer: Self-pay | Admitting: Family Medicine

## 2014-12-03 VITALS — BP 120/74 | HR 73 | Temp 97.6°F | Ht 59.0 in | Wt 149.8 lb

## 2014-12-03 DIAGNOSIS — F329 Major depressive disorder, single episode, unspecified: Secondary | ICD-10-CM | POA: Diagnosis not present

## 2014-12-03 DIAGNOSIS — F32A Depression, unspecified: Secondary | ICD-10-CM

## 2014-12-03 DIAGNOSIS — M549 Dorsalgia, unspecified: Secondary | ICD-10-CM | POA: Diagnosis not present

## 2014-12-03 NOTE — Progress Notes (Signed)
Pre visit review using our clinic review tool, if applicable. No additional management support is needed unless otherwise documented below in the visit note. 

## 2014-12-03 NOTE — Progress Notes (Signed)
HPI:  1 month follow up:  Depression: -positive depression screen by assistant 10/2014 -reports mildly depressed her whole life, does not want to take medications or do cbt -cares for husband whom has lung cancer and whom had stroke -denies: SI, thoughts of self harm  Bilat low back pain: -chronic, intermittent flares - reported 10/2014 -reports: did the exercises religiously and pain is completely gone, bowling without issues -denies: radiation, numbness, weakness, bowel of bladder dysfunction  HM: -bone density:  ROS: See pertinent positives and negatives per HPI.  Past Medical History  Diagnosis Date  . Tobacco abuse   . Fibroid uterus   . History of D&C     x2  . Bruxism   . Colon polyps   . Diverticulosis of colon (without mention of hemorrhage)   . Unspecified hemorrhoids without mention of complication   . Irritable bowel syndrome   . Hx of diverticulitis of colon 03/30/2014    Past Surgical History  Procedure Laterality Date  . Fibroid removed from uterus    . Cholecystectomy  2009  . Bce    . Dilation and curettage of uterus    . Appendectomy  1957    Family History  Problem Relation Age of Onset  . Heart disease Mother     CHF  . Cancer Father     oral    History   Social History  . Marital Status: Married    Spouse Name: N/A    Number of Children: 2  . Years of Education: N/A   Occupational History  . retired     Press photographer   Social History Main Topics  . Smoking status: Former Smoker -- 1.00 packs/day for 50 years    Types: Cigarettes    Quit date: 07/23/2008  . Smokeless tobacco: Never Used  . Alcohol Use: No  . Drug Use: No  . Sexual Activity: Not Currently   Other Topics Concern  . 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  aspirin 81 MG tablet, Take 81 mg by mouth daily.  , Disp: , Rfl:  .  B Complex Vitamins (B COMPLEX PO), Take by mouth., Disp: , Rfl:  .  metroNIDAZOLE (METROGEL) 0.75 % gel, Apply 1 application topically at bedtime. Applies to face for rosacea, Disp: , Rfl:  .  NON FORMULARY, Osteonatrix, Disp: , Rfl:  .  Pyridoxine HCl (VITAMIN B6 PO), Take by mouth., Disp: , Rfl:  .  vitamin C (ASCORBIC ACID) 500 MG tablet, Take 500 mg by mouth every evening. , Disp: , Rfl:  .  vitamin E (VITAMIN E) 400 UNIT capsule, Take 400 Units by mouth daily.  , Disp: , Rfl:  .  Wheat Dextrin (BENEFIBER) CHEW, Chew 15 each by mouth 3 (three) times daily. Powder-15 ml three times daily in tea, Disp: , Rfl:   EXAM:  Filed Vitals:   12/03/14 0948  BP: 120/74  Pulse: 73  Temp: 97.6 F (36.4 C)    Body mass index is 30.24 kg/(m^2).  GENERAL: vitals reviewed and listed above, alert, oriented, appears well hydrated and in no  acute distress  HEENT: atraumatic, conjunttiva clear, no obvious abnormalities on inspection of external nose and ears  NECK: no obvious masses on inspection  LUNGS: clear to auscultation bilaterally, no wheezes, rales or rhonchi, good air movement  CV: HRRR, no peripheral edema  MS: moves all extremities without noticeable abnormality, gait normal  PSYCH: pleasant and cooperative, no obvious depression or anxiety  ASSESSMENT AND PLAN:  Discussed the following assessment and plan:  Back pain, unspecified location -resolved, continue HEP, follow up as needed  Depression -declines treatment, offered CBT again - declined  -Patient advised to return or notify a doctor immediately if symptoms worsen or persist or new concerns arise.  There are no Patient Instructions on file for this visit.   Colin Benton R.

## 2015-02-09 DIAGNOSIS — Z1231 Encounter for screening mammogram for malignant neoplasm of breast: Secondary | ICD-10-CM | POA: Diagnosis not present

## 2015-02-09 LAB — HM MAMMOGRAPHY

## 2015-02-11 ENCOUNTER — Encounter: Payer: Self-pay | Admitting: Family Medicine

## 2015-02-23 ENCOUNTER — Ambulatory Visit (INDEPENDENT_AMBULATORY_CARE_PROVIDER_SITE_OTHER): Payer: Medicare Other | Admitting: Emergency Medicine

## 2015-02-23 VITALS — BP 120/78 | HR 75 | Temp 97.6°F | Resp 16 | Ht 59.0 in | Wt 150.0 lb

## 2015-02-23 DIAGNOSIS — H9191 Unspecified hearing loss, right ear: Secondary | ICD-10-CM | POA: Diagnosis not present

## 2015-02-23 DIAGNOSIS — H6121 Impacted cerumen, right ear: Secondary | ICD-10-CM

## 2015-02-23 NOTE — Progress Notes (Signed)
[facility] [address], [place] [address] [phone]  Date:  02/23/2015   Name:  Angel Holland   DOB:  10/29/1941   MRN:  [id number]  PCP:  Lucretia Kern., DO    Chief Complaint: Cerumen Impaction   History of Present Illness:  Angel Holland is a 73 y.o. very pleasant female patient who presents with the following:  Pressure in right ear associated with acute hearing loss History of frequent cerumen impactions No fever or chills No cough or coryza. No nausea or vomiting.   No rash. No improvement with over the counter medications or other home remedies. Denies other complaint or health concern today.   Patient Active Problem List   Diagnosis Date Noted  . History of IBS - followed by  GI 03/30/2014  . Headache, paroxysmal hemicrania, episodic 03/24/2014  . Pulmonary nodules - reports followed by pulmonology, reports following up with them in June 2015 03/26/2013    Past Medical History  Diagnosis Date  . Tobacco abuse   . Fibroid uterus   . History of D&C     x2  . Bruxism   . Colon polyps   . Diverticulosis of colon (without mention of hemorrhage)   . Unspecified hemorrhoids without mention of complication   . Irritable bowel syndrome   . Hx of diverticulitis of colon 03/30/2014    Past Surgical History  Procedure Laterality Date  . Fibroid removed from uterus    . Cholecystectomy  2009  . Bce    . Dilation and curettage of uterus    . Appendectomy  1957    History  Substance Use Topics  . Smoking status: Former Smoker -- 1.00 packs/day for 50 years    Types: Cigarettes    Quit date: 07/23/2008  . Smokeless tobacco: Never Used  . Alcohol Use: No    Family History  Problem Relation Age of Onset  . Heart disease Mother     CHF  . Cancer Father     oral    Allergies  Allergen Reactions  . Biaxin [Clarithromycin]     REACTION: BREASTS INFLAMMED  . Codeine     REACTION: PANIC ATTACKS, CAN'T SLEEP  . Tylenol  [Acetaminophen] Rash    Medication list has been reviewed and updated.  Current Outpatient Prescriptions on File Prior to Visit  Medication Sig Dispense Refill  . aspirin 81 MG tablet Take 81 mg by mouth daily.      . B Complex Vitamins (B COMPLEX PO) Take by mouth.    . metroNIDAZOLE (METROGEL) 0.75 % gel Apply 1 application topically at bedtime. Applies to face for rosacea    . NON FORMULARY Osteonatrix    . Pyridoxine HCl (VITAMIN B6 PO) Take by mouth.    . vitamin C (ASCORBIC ACID) 500 MG tablet Take 500 mg by mouth every evening.     . vitamin E (VITAMIN E) 400 UNIT capsule Take 400 Units by mouth daily.      . Wheat Dextrin (BENEFIBER) CHEW Chew 15 each by mouth 3 (three) times daily. Powder-15 ml three times daily in tea     No current facility-administered medications on file prior to visit.    Review of Systems:  As per HPI, otherwise negative.    Physical Examination: Filed Vitals:   02/23/15 1404  BP: 120/78  Pulse: 75  Temp: 97.6 F (36.4 C)  Resp: 16   Filed Vitals:   02/23/15 1404  Height: 4\' 11"  (1.499 m)  Weight: 150 lb (68.04 kg)   Body mass index is 30.28 kg/(m^2). Ideal Body Weight: Weight in (lb) to have BMI = 25: 123.5  GEN: WDWN, NAD, Non-toxic, A & O x 3 HEENT: Atraumatic, Normocephalic. Neck supple. No masses, No LAD. Ears and Nose: No external deformity.  Right cerumen impaction CV: RRR, No M/G/R. No JVD. No thrill. No extra heart sounds. PULM: CTA B, no wheezes, crackles, rhonchi. No retractions. No resp. distress. No accessory muscle use. ABD: S, NT, ND, +BS. No rebound. No HSM. EXTR: No c/c/e NEURO Normal gait.  PSYCH: Normally interactive. Conversant. Not depressed or anxious appearing.  Calm demeanor.    Assessment and Plan: Right cerumen impaction Atraumatic irrigation   Signed,  Ellison Carwin, MD

## 2015-03-23 DIAGNOSIS — L820 Inflamed seborrheic keratosis: Secondary | ICD-10-CM | POA: Diagnosis not present

## 2015-03-23 DIAGNOSIS — L304 Erythema intertrigo: Secondary | ICD-10-CM | POA: Diagnosis not present

## 2015-03-23 DIAGNOSIS — Z85828 Personal history of other malignant neoplasm of skin: Secondary | ICD-10-CM | POA: Diagnosis not present

## 2015-05-04 DIAGNOSIS — D1723 Benign lipomatous neoplasm of skin and subcutaneous tissue of right leg: Secondary | ICD-10-CM | POA: Diagnosis not present

## 2015-05-04 DIAGNOSIS — Z85828 Personal history of other malignant neoplasm of skin: Secondary | ICD-10-CM | POA: Diagnosis not present

## 2015-05-04 DIAGNOSIS — L820 Inflamed seborrheic keratosis: Secondary | ICD-10-CM | POA: Diagnosis not present

## 2015-05-04 DIAGNOSIS — D485 Neoplasm of uncertain behavior of skin: Secondary | ICD-10-CM | POA: Diagnosis not present

## 2015-07-12 ENCOUNTER — Ambulatory Visit (INDEPENDENT_AMBULATORY_CARE_PROVIDER_SITE_OTHER): Payer: Medicare Other | Admitting: Family Medicine

## 2015-07-12 ENCOUNTER — Encounter: Payer: Self-pay | Admitting: Family Medicine

## 2015-07-12 VITALS — BP 122/80 | HR 75 | Temp 97.9°F | Ht 58.75 in | Wt 148.7 lb

## 2015-07-12 DIAGNOSIS — R350 Frequency of micturition: Secondary | ICD-10-CM | POA: Diagnosis not present

## 2015-07-12 DIAGNOSIS — F330 Major depressive disorder, recurrent, mild: Secondary | ICD-10-CM

## 2015-07-12 DIAGNOSIS — Z23 Encounter for immunization: Secondary | ICD-10-CM

## 2015-07-12 DIAGNOSIS — Z0000 Encounter for general adult medical examination without abnormal findings: Secondary | ICD-10-CM

## 2015-07-12 DIAGNOSIS — R2241 Localized swelling, mass and lump, right lower limb: Secondary | ICD-10-CM

## 2015-07-12 DIAGNOSIS — N950 Postmenopausal bleeding: Secondary | ICD-10-CM | POA: Diagnosis not present

## 2015-07-12 DIAGNOSIS — R739 Hyperglycemia, unspecified: Secondary | ICD-10-CM

## 2015-07-12 DIAGNOSIS — R2240 Localized swelling, mass and lump, unspecified lower limb: Secondary | ICD-10-CM | POA: Insufficient documentation

## 2015-07-12 DIAGNOSIS — E119 Type 2 diabetes mellitus without complications: Secondary | ICD-10-CM | POA: Insufficient documentation

## 2015-07-12 DIAGNOSIS — E669 Obesity, unspecified: Secondary | ICD-10-CM

## 2015-07-12 LAB — HEMOGLOBIN A1C: Hgb A1c MFr Bld: 6.5 % (ref 4.6–6.5)

## 2015-07-12 LAB — URINALYSIS, MICROSCOPIC ONLY

## 2015-07-12 NOTE — Progress Notes (Signed)
Medicare Annual Preventive Care Visit  (initial annual wellness or annual wellness exam)  Concerns and/or follow up today:  Depression: -positive depression screen by assistant 10/2014 -reports continued mild depression her whole life, does not want to take medications or do cbt -cares for husband whom has lung cancer and whom had stroke but feel sis doing ok -denies: SI, thoughts of self harm  Mild borderline diabetes: -no meds, diet controlled -reports daily exercise and working on low carb diet, however she likes canned mandarin oranges  Bleeding: -started 2 years ago -occurs for 1 day about 2 times per year -? From urinary tract or vaginal - she is not sure -reports bright red blood for vaginal or urinary tract, small amount without symptoms -chronic mild urinary urgency, but this is unchanged -denies: abd pain, bowel changes, rectal bleeding, malaise, weight loss  Lump on R foot: -for > 10 years -not painful or causing her any trouble   ROS: negative for report of fevers, unintentional weight loss, vision changes, vision loss, hearing loss or change, chest pain, sob, hemoptysis, melena, hematochezia, hematuria, genital discharge or lesions, falls, bruising, loc, thoughts of suicide or self harm, memory loss  1.) Patient-completed health risk assessment  - completed and reviewed, see scanned documentation  2.) Review of Medical History: -PMH, PSH, Family History and current specialty and care providers reviewed and updated and listed below  - see scanned in document in chart and below  Past Medical History  Diagnosis Date  . Tobacco abuse   . Fibroid uterus   . History of D&C     x2  . Bruxism   . Colon polyps   . Diverticulosis of colon (without mention of hemorrhage)   . Unspecified hemorrhoids without mention of complication   . Irritable bowel syndrome   . Hx of diverticulitis of colon 03/30/2014  . Hyperglycemia     Past Surgical History  Procedure Laterality  Date  . Fibroid removed from uterus    . Cholecystectomy  2009  . Bce    . Dilation and curettage of uterus    . Appendectomy  1957    Social History   Social History  . Marital Status: Married    Spouse Name: N/A  . Number of Children: 2  . Years of Education: N/A   Occupational History  . retired     Press photographer   Social History Main Topics  . Smoking status: Former Smoker -- 1.00 packs/day for 50 years    Types: Cigarettes    Quit date: 07/23/2008  . Smokeless tobacco: Never Used  . Alcohol Use: No  . Drug Use: No  . Sexual Activity: Not Currently   Other Topics Concern  . Not on file   Social History Narrative   Work or School:  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      Home Situation: lives with husband      Spiritual Beliefs: Christian      Lifestyle: walking daily ; very poor                 The patient has a family history of  3.) Review of functional ability and level of safety:  Any difficulty hearing? YES NO  History of falling? YES NO  Any trouble with IADLs - using a phone, using transportation, grocery shopping, preparing meals, doing housework, doing laundry, taking medications and managing money? YES NO  Advance Directives? YES NO  See  summary of recommendations in Patient Instructions below.  4.) Physical Exam Filed Vitals:   07/12/15 0818  BP: 122/80  Pulse: 75  Temp: 97.9 F (36.6 C)   Estimated body mass index is 30.3 kg/(m^2) as calculated from the following:   Height as of this encounter: 4' 10.75" (1.492 m).   Weight as of this encounter: 148 lb 11.2 oz (67.45 kg).  EKG (optional): deferred  General: alert, appear well hydrated and in no acute distress  HEENT: visual acuity grossly intact  CV: HRRR  Lungs: CTA bilaterally  ABD: BS+, soft, NTTP  Psych: pleasant and cooperative, no obvious depression or anxiety  Cognitive function: grossly intact  FOOT: soft, mobile small  subcutaneous mass R medial foot approx 10x22mm  See patient instructions for recommendations.  Education and counseling regarding the above review of health provided with a plan for the following: -see scanned patient completed form for further details -fall prevention strategies discussed  -healthy lifestyle discussed -importance and resources for completing advanced directives discussed -see patient instructions below for any other recommendations provided  4)The following written screening schedule of preventive measures were reviewed with assessment and plan made per below, orders and patient instructions:      AAA screening: n/a     Alcohol screening: done     Obesity Screening and counseling: done     STI screening (Hep C if born 1946-65): declined     Tobacco Screening: done       Pneumococcal (PPSV23 -one dose after 47, one before if risk factors), influenza yearly and hepatitis B vaccines (if high risk - end stage renal disease, IV drugs, homosexual men, live in home for mentally retarded, hemophilia receiving factors) ASSESSMENT/PLAN: prevnar 43 today      Screening mammograph (yearly if >40) ASSESSMENT/PLAN: done      Screening Pap smear/pelvic exam (q2 years) ASSESSMENT/PLAN: n/a - seeing gyn      Prostate cancer screening ASSESSMENT/PLAN: n/a      Colorectal cancer screening (FOBT yearly or flex sig q4y or colonoscopy q10y or barium enema q4y) ASSESSMENT/PLAN:      Diabetes outpatient self-management training services ASSESSMENT/PLAN: done      Bone mass measurements(covered q2y if indicated - estrogen def, osteoporosis, hyperparathyroid, vertebral abnormalities, osteoporosis or steroids) ASSESSMENT/PLAN: refused      Screening for glaucoma(q1y if high risk - diabetes, FH, AA and > 13 or hispanic and > 50) ASSESSMENT/PLAN: sees optho      Medical nutritional therapy for individuals with diabetes or renal disease ASSESSMENT/PLAN:done      Cardiovascular  screening blood tests (lipids q5y) ASSESSMENT/PLAN: done      Diabetes screening tests ASSESSMENT/PLAN: recheck today   7.) Summary:   Medicare annual wellness visit, subsequent -risk factors and conditions per above assessment were discussed and treatment, recommendations and referrals were offered per documentation above and orders and patient instructions. -sees extensive scanned documentation  Major depressive disorder, recurrent episode, mild -discussed treatment option, safety, no thoughts of self harm, refused any treatment  Hyperglycemia - Plan: Hemoglobin A1c -liefstyle recs  Post-menopause bleeding -sounds vaginal, will check urine and advised referral to gyn for eval - she agreed to see gyn, but prefers to contact her gynecologist and declines referral  Mass of foot, right -discussed potential etiologies, suspect cyst or lipoma, advised biopsy/removal if wishes to confirm dx - she declined  Obesity -lifestyle recs  Patient Instructions  BEFORE YOU LEAVE: -flu vaccine -prevnar 13 -urine dip with reflex micro and culture if  abnormal -follow up in 6 months  Please call your gynecologist today to schedule an appointment for evaluation of the possible bleeding.  Please check with your dermatologist regarding the skin lesion on your leg  We recommend the following healthy lifestyle measures: - eat a healthy diet consisting of lots of vegetables, fruits, beans, nuts, seeds, healthy meats such as white chicken and fish and whole grains.  - avoid fried foods, fast food, processed foods, sodas, red meet and other fattening foods.  - get a least 150 minutes of aerobic exercise per week.

## 2015-07-12 NOTE — Patient Instructions (Addendum)
BEFORE YOU LEAVE: -flu vaccine -prevnar 13 -urine dip with reflex micro and culture if abnormal -follow up in 6 months  Please call your gynecologist today to schedule an appointment for evaluation of the possible bleeding.  Please check with your dermatologist regarding the skin lesion on your leg  We recommend the following healthy lifestyle measures: - eat a healthy diet consisting of lots of vegetables, fruits, beans, nuts, seeds, healthy meats such as white chicken and fish and whole grains.  - avoid fried foods, fast food, processed foods, sodas, red meet and other fattening foods.  - get a least 150 minutes of aerobic exercise per week.

## 2015-07-12 NOTE — Addendum Note (Signed)
Addended by: Agnes Lawrence on: 07/12/2015 09:45 AM   Modules accepted: Orders

## 2015-07-12 NOTE — Addendum Note (Signed)
Addended by: Agnes Lawrence on: 07/12/2015 10:04 AM   Modules accepted: Orders

## 2015-07-12 NOTE — Progress Notes (Signed)
Pre visit review using our clinic review tool, if applicable. No additional management support is needed unless otherwise documented below in the visit note. 

## 2015-07-12 NOTE — Addendum Note (Signed)
Addended by: Agnes Lawrence on: 07/12/2015 09:37 AM   Modules accepted: Orders

## 2015-07-14 LAB — URINE CULTURE

## 2015-07-19 NOTE — Addendum Note (Signed)
Addended by: Lahoma Crocker A on: 07/19/2015 11:30 AM   Modules accepted: Orders

## 2015-08-03 DIAGNOSIS — N950 Postmenopausal bleeding: Secondary | ICD-10-CM | POA: Diagnosis not present

## 2015-08-16 ENCOUNTER — Other Ambulatory Visit (INDEPENDENT_AMBULATORY_CARE_PROVIDER_SITE_OTHER): Payer: Medicare Other

## 2015-08-16 DIAGNOSIS — R350 Frequency of micturition: Secondary | ICD-10-CM | POA: Diagnosis not present

## 2015-08-16 LAB — URINALYSIS, MICROSCOPIC ONLY

## 2015-08-19 ENCOUNTER — Other Ambulatory Visit: Payer: Medicare Other

## 2015-08-31 ENCOUNTER — Other Ambulatory Visit (HOSPITAL_COMMUNITY): Payer: Self-pay | Admitting: Obstetrics and Gynecology

## 2015-09-02 ENCOUNTER — Encounter (HOSPITAL_COMMUNITY)
Admission: RE | Admit: 2015-09-02 | Discharge: 2015-09-02 | Disposition: A | Discharge status: Home / Self Care | Payer: Medicare Other | Source: Ambulatory Visit | Attending: Obstetrics and Gynecology | Admitting: Obstetrics and Gynecology

## 2015-09-02 ENCOUNTER — Encounter (HOSPITAL_COMMUNITY): Payer: Self-pay

## 2015-09-02 ENCOUNTER — Other Ambulatory Visit: Payer: Medicare Other

## 2015-09-02 DIAGNOSIS — Z01818 Encounter for other preprocedural examination: Secondary | ICD-10-CM | POA: Insufficient documentation

## 2015-09-02 DIAGNOSIS — N950 Postmenopausal bleeding: Secondary | ICD-10-CM | POA: Diagnosis not present

## 2015-09-02 HISTORY — DX: Anxiety disorder, unspecified: F41.9

## 2015-09-02 HISTORY — DX: Depression, unspecified: F32.A

## 2015-09-02 HISTORY — DX: Unspecified osteoarthritis, unspecified site: M19.90

## 2015-09-02 HISTORY — DX: Major depressive disorder, single episode, unspecified: F32.9

## 2015-09-02 LAB — COMPREHENSIVE METABOLIC PANEL
ALBUMIN: 4.3 g/dL (ref 3.5–5.0)
ALT: 22 U/L (ref 14–54)
ANION GAP: 9 (ref 5–15)
AST: 24 U/L (ref 15–41)
Alkaline Phosphatase: 51 U/L (ref 38–126)
BUN: 16 mg/dL (ref 6–20)
CO2: 24 mmol/L (ref 22–32)
Calcium: 9.7 mg/dL (ref 8.9–10.3)
Chloride: 107 mmol/L (ref 101–111)
Creatinine, Ser: 0.71 mg/dL (ref 0.44–1.00)
GFR calc Af Amer: >60 mL/min (ref >60)
GFR calc non Af Amer: >60 mL/min (ref >60)
GLUCOSE: 126 mg/dL — AB (ref 65–99)
POTASSIUM: 4.2 mmol/L (ref 3.5–5.1)
SODIUM: 140 mmol/L (ref 135–145)
Total Bilirubin: 0.5 mg/dL (ref 0.3–1.2)
Total Protein: 7.2 g/dL (ref 6.5–8.1)

## 2015-09-02 LAB — CBC
HEMATOCRIT: 39.7 % (ref 36.0–46.0)
HEMOGLOBIN: 13.2 g/dL (ref 12.0–15.0)
MCH: 29.3 pg (ref 26.0–34.0)
MCHC: 33.2 g/dL (ref 30.0–36.0)
MCV: 88 fL (ref 78.0–100.0)
Platelets: 246 10*3/uL (ref 150–400)
RBC: 4.51 MIL/uL (ref 3.87–5.11)
RDW: 14.6 % (ref 11.5–15.5)
WBC: 7.3 10*3/uL (ref 4.0–10.5)

## 2015-09-02 LAB — ABO/RH: ABO/RH(D): O POS

## 2015-09-02 LAB — TYPE AND SCREEN
ABO/RH(D): O POS
Antibody Screen: NEGATIVE

## 2015-09-02 MED ORDER — FENTANYL CITRATE (PF) 250 MCG/5ML IJ SOLN
INTRAMUSCULAR | Status: AC
  Filled 2015-09-02: qty 25

## 2015-09-02 MED ORDER — MIDAZOLAM HCL 2 MG/2ML IJ SOLN
INTRAMUSCULAR | Status: AC
  Filled 2015-09-02: qty 4

## 2015-09-02 NOTE — Patient Instructions (Signed)
Your procedure is scheduled on:09/09/15  Enter through the Main Entrance at :63 am Pick up desk phone and dial [phone] and inform us of your arrival.  Please call [phone] if you have any problems the morning of surgery.  Remember: Do not eat food after midnight:WED Clear liquids are ok until:8am on Thursday   You may brush your teeth the morning of surgery.  Take these meds the morning of surgery with a sip of water:NONE  DO NOT wear jewelry, eye make-up, lipstick,body lotion, or dark fingernail polish.  (Polished toes are ok) You may wear deodorant.  If you are to be admitted after surgery, leave suitcase in car until your room has been assigned. Patients discharged on the day of surgery will not be allowed to drive home. Wear loose fitting, comfortable clothes for your ride home.

## 2015-09-02 NOTE — Pre-Procedure Instructions (Signed)
Abnormal EKG approved by Dr. Royce Macadamia

## 2015-09-08 ENCOUNTER — Encounter (HOSPITAL_COMMUNITY): Payer: Self-pay | Admitting: Anesthesiology

## 2015-09-08 NOTE — Anesthesia Preprocedure Evaluation (Addendum)
Anesthesia Evaluation  Patient identified by MRN, date of birth, ID band Patient awake    Reviewed: Allergy & Precautions, NPO status , Patient's Chart, lab work & pertinent test results  Airway Mallampati: IV  TM Distance: >3 FB Neck ROM: Limited  Mouth opening: Limited Mouth Opening  Dental no notable dental hx. (+) Teeth Intact   Pulmonary former smoker,  50 pack year hx/o smoking   Pulmonary exam normal breath sounds clear to auscultation- rhonchi + decreased breath sounds      Cardiovascular negative cardio ROS Normal cardiovascular exam Rhythm:Regular Rate:Normal     Neuro/Psych  Headaches, PSYCHIATRIC DISORDERS Anxiety Depression    GI/Hepatic negative GI ROS, Neg liver ROS,   Endo/Other  Obesity  Renal/GU negative Renal ROS     Musculoskeletal  (+) Arthritis ,   Abdominal (+) + obese,   Peds  Hematology   Anesthesia Other Findings   Reproductive/Obstetrics PMB                            Anesthesia Physical Anesthesia Plan  ASA: II  Anesthesia Plan: General   Post-op Pain Management:    Induction: Intravenous  Airway Management Planned: LMA  Additional Equipment:   Intra-op Plan:   Post-operative Plan: Extubation in OR  Informed Consent: I have reviewed the patients History and Physical, chart, labs and discussed the procedure including the risks, benefits and alternatives for the proposed anesthesia with the patient or authorized representative who has indicated his/her understanding and acceptance.   Dental advisory given  Plan Discussed with: CRNA, Anesthesiologist and Surgeon  Anesthesia Plan Comments: (Possible difficult intubation.)       Anesthesia Quick Evaluation

## 2015-09-09 ENCOUNTER — Ambulatory Visit (HOSPITAL_COMMUNITY): Payer: Medicare Other | Admitting: Anesthesiology

## 2015-09-09 ENCOUNTER — Encounter (HOSPITAL_COMMUNITY)
Admission: RE | Disposition: A | Discharge status: Home / Self Care | Payer: Self-pay | Source: Ambulatory Visit | Attending: Obstetrics and Gynecology

## 2015-09-09 ENCOUNTER — Telehealth: Payer: Self-pay | Admitting: Family Medicine

## 2015-09-09 ENCOUNTER — Encounter (HOSPITAL_COMMUNITY): Payer: Self-pay | Admitting: Anesthesiology

## 2015-09-09 ENCOUNTER — Observation Stay (HOSPITAL_COMMUNITY)
Admission: RE | Admit: 2015-09-09 | Discharge: 2015-09-09 | Disposition: A | Discharge status: Home / Self Care | Payer: Medicare Other | Source: Ambulatory Visit | Attending: Obstetrics and Gynecology | Admitting: Obstetrics and Gynecology

## 2015-09-09 DIAGNOSIS — Z86010 Personal history of colonic polyps: Secondary | ICD-10-CM | POA: Insufficient documentation

## 2015-09-09 DIAGNOSIS — K589 Irritable bowel syndrome without diarrhea: Secondary | ICD-10-CM | POA: Diagnosis not present

## 2015-09-09 DIAGNOSIS — Z6830 Body mass index (BMI) 30.0-30.9, adult: Secondary | ICD-10-CM | POA: Insufficient documentation

## 2015-09-09 DIAGNOSIS — Z8719 Personal history of other diseases of the digestive system: Secondary | ICD-10-CM | POA: Diagnosis not present

## 2015-09-09 DIAGNOSIS — M1990 Unspecified osteoarthritis, unspecified site: Secondary | ICD-10-CM | POA: Insufficient documentation

## 2015-09-09 DIAGNOSIS — E669 Obesity, unspecified: Secondary | ICD-10-CM | POA: Insufficient documentation

## 2015-09-09 DIAGNOSIS — Z888 Allergy status to other drugs, medicaments and biological substances status: Secondary | ICD-10-CM | POA: Diagnosis not present

## 2015-09-09 DIAGNOSIS — N854 Malposition of uterus: Secondary | ICD-10-CM | POA: Diagnosis not present

## 2015-09-09 DIAGNOSIS — N882 Stricture and stenosis of cervix uteri: Secondary | ICD-10-CM | POA: Insufficient documentation

## 2015-09-09 DIAGNOSIS — Z886 Allergy status to analgesic agent status: Secondary | ICD-10-CM | POA: Diagnosis not present

## 2015-09-09 DIAGNOSIS — N952 Postmenopausal atrophic vaginitis: Secondary | ICD-10-CM | POA: Insufficient documentation

## 2015-09-09 DIAGNOSIS — N840 Polyp of corpus uteri: Secondary | ICD-10-CM | POA: Insufficient documentation

## 2015-09-09 DIAGNOSIS — Z87891 Personal history of nicotine dependence: Secondary | ICD-10-CM | POA: Insufficient documentation

## 2015-09-09 DIAGNOSIS — Z7982 Long term (current) use of aspirin: Secondary | ICD-10-CM | POA: Insufficient documentation

## 2015-09-09 DIAGNOSIS — N950 Postmenopausal bleeding: Principal | ICD-10-CM | POA: Insufficient documentation

## 2015-09-09 DIAGNOSIS — Z885 Allergy status to narcotic agent status: Secondary | ICD-10-CM | POA: Insufficient documentation

## 2015-09-09 HISTORY — PX: HYSTEROSCOPY WITH D & C: SHX1775

## 2015-09-09 SURGERY — DILATATION AND CURETTAGE /HYSTEROSCOPY
Anesthesia: General

## 2015-09-09 MED ORDER — DEXAMETHASONE SODIUM PHOSPHATE 10 MG/ML IJ SOLN
INTRAMUSCULAR | Status: DC | PRN
  Administered 2015-09-09: 4 mg via INTRAVENOUS

## 2015-09-09 MED ORDER — ONDANSETRON HCL 4 MG/2ML IJ SOLN
INTRAMUSCULAR | Status: DC | PRN
  Administered 2015-09-09: 4 mg via INTRAVENOUS

## 2015-09-09 MED ORDER — SODIUM CHLORIDE 0.9 % IJ SOLN: 3.0000 mL | INTRAMUSCULAR | Status: DC | PRN

## 2015-09-09 MED ORDER — FENTANYL CITRATE (PF) 100 MCG/2ML IJ SOLN: 25.0000 ug | INTRAMUSCULAR | Status: DC | PRN

## 2015-09-09 MED ORDER — LACTATED RINGERS IV SOLN: INTRAVENOUS | Status: DC

## 2015-09-09 MED ORDER — EPHEDRINE 5 MG/ML INJ
INTRAVENOUS | Status: AC
  Filled 2015-09-09: qty 10

## 2015-09-09 MED ORDER — PHENYLEPHRINE HCL 10 MG/ML IJ SOLN
INTRAMUSCULAR | Status: DC | PRN
  Administered 2015-09-09: 12.5 ug via INTRAVENOUS

## 2015-09-09 MED ORDER — HYDROCODONE-ACETAMINOPHEN 5-325 MG PO TABS: 1.0000 | ORAL_TABLET | ORAL | Status: DC | PRN

## 2015-09-09 MED ORDER — SODIUM CHLORIDE 0.9 % IJ SOLN
INTRAMUSCULAR | Status: AC
Start: 2015-09-09 — End: 2015-09-09
  Filled 2015-09-09: qty 10

## 2015-09-09 MED ORDER — MEPERIDINE HCL 25 MG/ML IJ SOLN: 6.2500 mg | INTRAMUSCULAR | Status: DC | PRN

## 2015-09-09 MED ORDER — SODIUM CHLORIDE 0.9 % IV SOLN: 250.0000 mL | INTRAVENOUS | Status: DC | PRN

## 2015-09-09 MED ORDER — LIDOCAINE HCL (CARDIAC) 20 MG/ML IV SOLN
INTRAVENOUS | Status: AC
  Filled 2015-09-09: qty 5

## 2015-09-09 MED ORDER — KETOROLAC TROMETHAMINE 30 MG/ML IJ SOLN
INTRAMUSCULAR | Status: AC
Start: 2015-09-09 — End: 2015-09-09
  Filled 2015-09-09: qty 1

## 2015-09-09 MED ORDER — HYDROCODONE-ACETAMINOPHEN 5-325 MG PO TABS: ORAL_TABLET | ORAL | Status: DC

## 2015-09-09 MED ORDER — LIDOCAINE HCL 1 % IJ SOLN
INTRAMUSCULAR | Status: AC
  Filled 2015-09-09: qty 20

## 2015-09-09 MED ORDER — FENTANYL CITRATE (PF) 100 MCG/2ML IJ SOLN
INTRAMUSCULAR | Status: AC
  Filled 2015-09-09: qty 4

## 2015-09-09 MED ORDER — EPHEDRINE SULFATE 50 MG/ML IJ SOLN
INTRAMUSCULAR | Status: DC | PRN
  Administered 2015-09-09: 15 mg via INTRAVENOUS

## 2015-09-09 MED ORDER — DEXAMETHASONE SODIUM PHOSPHATE 4 MG/ML IJ SOLN
INTRAMUSCULAR | Status: AC
  Filled 2015-09-09: qty 1

## 2015-09-09 MED ORDER — SODIUM CHLORIDE 0.9 % IJ SOLN: 3.0000 mL | Freq: Two times a day (BID) | INTRAMUSCULAR | Status: DC

## 2015-09-09 MED ORDER — PROPOFOL 10 MG/ML IV BOLUS
INTRAVENOUS | Status: AC
Start: 2015-09-09 — End: 2015-09-09
  Filled 2015-09-09: qty 20

## 2015-09-09 MED ORDER — ONDANSETRON HCL 4 MG/2ML IJ SOLN
INTRAMUSCULAR | Status: AC
Start: 2015-09-09 — End: 2015-09-09
  Filled 2015-09-09: qty 2

## 2015-09-09 MED ORDER — PRENATAL MULTIVITAMIN CH: 1.0000 | ORAL_TABLET | Freq: Every day | ORAL | Status: DC

## 2015-09-09 MED ORDER — LACTATED RINGERS IV SOLN
INTRAVENOUS | Status: DC
  Administered 2015-09-09 (×2): via INTRAVENOUS

## 2015-09-09 MED ORDER — LIDOCAINE HCL (CARDIAC) 20 MG/ML IV SOLN
INTRAVENOUS | Status: DC | PRN
  Administered 2015-09-09: 20 mg via INTRAVENOUS
  Administered 2015-09-09: 80 mg via INTRAVENOUS

## 2015-09-09 MED ORDER — METOCLOPRAMIDE HCL 5 MG/ML IJ SOLN: 10.0000 mg | Freq: Once | INTRAMUSCULAR | Status: DC | PRN

## 2015-09-09 MED ORDER — FENTANYL CITRATE (PF) 100 MCG/2ML IJ SOLN
INTRAMUSCULAR | Status: DC | PRN
  Administered 2015-09-09 (×2): 50 ug via INTRAVENOUS

## 2015-09-09 MED ORDER — PROPOFOL 10 MG/ML IV BOLUS
INTRAVENOUS | Status: DC | PRN
  Administered 2015-09-09: 160 mg via INTRAVENOUS

## 2015-09-09 SURGICAL SUPPLY — 18 items
ABLATOR ENDOMETRIAL BIPOLAR (ABLATOR) IMPLANT
CANISTER SUCT 3000ML (MISCELLANEOUS) ×3 IMPLANT
CATH ROBINSON RED A/P 16FR (CATHETERS) ×3 IMPLANT
CLOTH BEACON ORANGE TIMEOUT ST (SAFETY) ×3 IMPLANT
CONTAINER PREFILL 10% NBF 60ML (FORM) ×6 IMPLANT
DILATOR CANAL MILEX (MISCELLANEOUS) ×3 IMPLANT
ELECT REM PT RETURN 9FT ADLT (ELECTROSURGICAL)
ELECTRODE REM PT RTRN 9FT ADLT (ELECTROSURGICAL) IMPLANT
GLOVE BIO SURGEON STRL SZ7 (GLOVE) ×3 IMPLANT
GLOVE BIOGEL PI IND STRL 7.0 (GLOVE) ×1 IMPLANT
GLOVE BIOGEL PI INDICATOR 7.0 (GLOVE) ×2
GOWN STRL REUS W/TWL LRG LVL3 (GOWN DISPOSABLE) ×6 IMPLANT
PACK VAGINAL MINOR WOMEN LF (CUSTOM PROCEDURE TRAY) ×3 IMPLANT
PAD OB MATERNITY 4.3X12.25 (PERSONAL CARE ITEMS) ×3 IMPLANT
TOWEL OR 17X24 6PK STRL BLUE (TOWEL DISPOSABLE) ×6 IMPLANT
TUBING AQUILEX INFLOW (TUBING) ×3 IMPLANT
TUBING AQUILEX OUTFLOW (TUBING) ×3 IMPLANT
WATER STERILE IRR 1000ML POUR (IV SOLUTION) ×3 IMPLANT

## 2015-09-09 NOTE — Discharge Instructions (Signed)
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
## 2015-09-09 NOTE — Op Note (Signed)
Pre-Operative Diagnosis: 1) Postmenopausal bleeding Postoperative diagnoses: 1) Same Procedure: Hysteroscopy, Dilation and Curettage Surgeon: Dr. Vanessa Kick Asst.: None Operative findings: atrophic vaginal epithelium. Retroverted uterus with mild cervical stenosis. With dilation of the cervix a mucus-like substance was noted within the cervix. With infusion of distention media a larger volume of mucoid substance drained. This was collected and sent for cytology. The intrauterine cavity was noted to be atrophic. Bilateral tubal ostia were noted. At 12:00 at the internal cervical os a small polypoid area was noted Specimen: Endometrial fluid and curettings. Fluid deficit 175 cc EBL: minimal  Procedure: Angel Holland is a 74 year old Caucasian female who presents for surgical evaluation of possible postmenopausal bleeding. At her exam she complained of an episode of gushing of blood 2. She describes the sensation as a sensation of a balloon popping and then blood would gush. She felt that this was actually coming from her bladder. Exam in the office did not demonstrate any blood, however a pelvic ultrasound was performed which demonstrated a thin appearing endometrium with physiologic fluid within the cavity. There appeared to be a small 3 mm endometrial polyp emanating from the wall of the uterus. Given these findings the decision was made to proceed with hysteroscopy D&C. Risks, benefits, alternatives of the procedure were discussed at length. Following the appropriate informed consent patient was brought to the operating room. Gen. Anesthesia was administered and she was placed in the dorsal lithotomy position. She was appropriately prepped and draped for surgery. Prior to initiating the surgical procedure a timeout procedure was performed by the patient was appropriately identified. Speculum was then placed into the vagina and a single-tooth tenaculum was placed on the anterior lip of the cervix. Initially,  the cervix was dilated with the cervical os finder followed by Hank cervical dilators. The above findings were noted with introduction of the hysteroscope . A sharp curettage was performed. This completed the surgical procedure. All sponge, lap, needle counts were correct. The patient was taken to the recovery room in stable condition following the procedure.

## 2015-09-09 NOTE — Transfer of Care (Signed)
Immediate Anesthesia Transfer of Care Note  Patient: Angel Holland  Procedure(s) Performed: Procedure(s): DILATATION AND CURETTAGE /HYSTEROSCOPY (N/A)  Patient Location: PACU  Anesthesia Type:General  Level of Consciousness: awake, sedated and patient cooperative  Airway & Oxygen Therapy: Patient Spontanous Breathing and Patient connected to nasal cannula oxygen  Post-op Assessment: Report given to RN and Post -op Vital signs reviewed and stable  Post vital signs: Reviewed and stable  Last Vitals:  Filed Vitals:   09/09/15 1043  BP: 132/82  Pulse: 70  Temp: 36.6 C  Resp: 18    Complications: No apparent anesthesia complications

## 2015-09-09 NOTE — Anesthesia Postprocedure Evaluation (Signed)
  Anesthesia Post-op Note  Patient: Angel Holland  Procedure(s) Performed: Procedure(s) (LRB): DILATATION AND CURETTAGE /HYSTEROSCOPY (N/A)  Patient Location: PACU  Anesthesia Type: General  Level of Consciousness: awake and alert   Airway and Oxygen Therapy: Patient Spontanous Breathing  Post-op Pain: mild  Post-op Assessment: Post-op Vital signs reviewed, Patient's Cardiovascular Status Stable, Respiratory Function Stable, Patent Airway and No signs of Nausea or vomiting  Last Vitals:  Filed Vitals:   09/09/15 1300  BP: 128/75  Pulse: 82  Temp:   Resp: 18    Post-op Vital Signs: stable   Complications: No apparent anesthesia complications
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
## 2015-09-09 NOTE — Progress Notes (Signed)
Pt is refusing to stay in the hospital overnight. She wants to go home.  I communicated this to Dr Harrington Challenger by phone.  Dr Harrington Challenger is asking that the social worker come talk with the pt in PACU before she goes home.  I called Donnie Aho at [phone], who will come see pt today.

## 2015-09-09 NOTE — H&P (Signed)
Angel Holland is an 74 y.o. female.  74 yo who presents for hysteroscopy d&C for evaluation of postmenopausal bleeding.[place] in the office showed a small amount of fluid with a 3 mm polypoid structure. Given this finding the decision was made to proceed with hysteroscopy D&C for further evaluation. R/B/A of procedure were discussed at length   No LMP recorded. Patient is postmenopausal.    Past Medical History  Diagnosis Date  . Tobacco abuse   . Fibroid uterus   . History of D&C     x2  . Bruxism   . Colon polyps   . Diverticulosis of colon (without mention of hemorrhage)   . Unspecified hemorrhoids without mention of complication   . Irritable bowel syndrome   . Hx of diverticulitis of colon 03/30/2014  . Hyperglycemia   . Depression     no meds  . Anxiety     no meds  . Arthritis     back and fingers     Past Surgical History  Procedure Laterality Date  . Fibroid removed from uterus    . Cholecystectomy  2009  . Bce    . Dilation and curettage of uterus    . Appendectomy  1957    Family History  Problem Relation Age of Onset  . Heart disease Mother     CHF  . Cancer Father     oral    Social History:  reports that she quit smoking about 7 years ago. Her smoking use included Cigarettes. She has a 50 pack-year smoking history. She has never used smokeless tobacco. She reports that she does not drink alcohol or use illicit drugs.  Allergies:  Allergies  Allergen Reactions  . Biaxin [Clarithromycin] Other (See Comments)    BREASTS INFLAMMED  . Codeine Other (See Comments)    PANIC ATTACKS, CAN'T SLEEP  . Tylenol [Acetaminophen] Rash    Prescriptions prior to admission  Medication Sig Dispense Refill Last Dose  . aspirin 81 MG tablet Take 81 mg by mouth daily.     09/08/2015 at Unknown time  . cholecalciferol (VITAMIN D) 1000 UNITS tablet Take 1,000 Units by mouth daily.   09/08/2015 at Unknown time  . ibuprofen (ADVIL,MOTRIN) 200 MG tablet Take 200 mg by mouth  every 6 (six) hours as needed for headache or mild pain.   09/08/2015 at Unknown time  . metroNIDAZOLE (METROGEL) 0.75 % gel Apply 1 application topically at bedtime. Applies to face for rosacea   09/08/2015 at Unknown time  . mometasone (NASONEX) 50 MCG/ACT nasal spray Place 2 sprays into the nose daily as needed (for congestion).   Past Month at Unknown time  . NON FORMULARY Take 2 tablets by mouth daily. OsteoMatrix   09/08/2015 at Unknown time  . NON FORMULARY Take 1 each by mouth daily. Physique powder drink (for muscle spasms) from [employer]   Past Week at Unknown time  . pyridOXINE (VITAMIN B-6) 100 MG tablet Take 100 mg by mouth daily.   09/08/2015 at Unknown time  . vitamin B-12 (CYANOCOBALAMIN) 100 MCG tablet Take 100 mcg by mouth daily.   09/08/2015 at Unknown time  . vitamin C (ASCORBIC ACID) 500 MG tablet Take 500 mg by mouth every evening.    09/08/2015 at Unknown time  . vitamin E (VITAMIN E) 400 UNIT capsule Take 400 Units by mouth daily.     09/08/2015 at Unknown time  . Wheat Dextrin (BENEFIBER DRINK MIX PO) Take 1 each by mouth 2 (  two) times daily.   09/08/2015 at Unknown time    ROS: as above  Blood pressure 132/82, pulse 70, temperature 97.9 F (36.6 C), temperature source Oral, resp. rate 18, SpO2 98 %. Physical Exam   AOX3 NAD Abd soft  No results found for this or any previous visit (from the past 24 hour(s)).  No results found.  Assessment/Plan: 1) Admit 2) Hysteroscopy, D&C  Angel Holland H. 09/09/2015, 11:50 AM

## 2015-09-09 NOTE — Anesthesia Procedure Notes (Signed)
Procedure Name: LMA Insertion Date/Time: 09/09/2015 12:04 PM Performed by: Tobin Chad Pre-anesthesia Checklist: Patient identified, Timeout performed, Emergency Drugs available, Suction available and Patient being monitored Patient Re-evaluated:Patient Re-evaluated prior to inductionOxygen Delivery Method: Simple face mask and Circle system utilized Preoxygenation: Pre-oxygenation with 100% oxygen Intubation Type: IV induction Ventilation: Mask ventilation without difficulty LMA Size: 4.0 Grade View: Grade III Tube type: Oral Tube size: 4.0 mm Number of attempts: 2 Dental Injury: Teeth and Oropharynx as per pre-operative assessment and Injury to lip  Difficulty Due To: Difficulty was anticipated
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
## 2015-09-10 ENCOUNTER — Encounter (HOSPITAL_COMMUNITY): Payer: Self-pay | Admitting: Obstetrics and Gynecology

## 2015-09-14 DIAGNOSIS — L570 Actinic keratosis: Secondary | ICD-10-CM | POA: Diagnosis not present

## 2015-09-14 DIAGNOSIS — Z85828 Personal history of other malignant neoplasm of skin: Secondary | ICD-10-CM | POA: Diagnosis not present

## 2015-09-14 DIAGNOSIS — D692 Other nonthrombocytopenic purpura: Secondary | ICD-10-CM | POA: Diagnosis not present

## 2015-09-14 DIAGNOSIS — L821 Other seborrheic keratosis: Secondary | ICD-10-CM | POA: Diagnosis not present

## 2015-09-14 DIAGNOSIS — D1723 Benign lipomatous neoplasm of skin and subcutaneous tissue of right leg: Secondary | ICD-10-CM | POA: Diagnosis not present

## 2015-09-14 DIAGNOSIS — L718 Other rosacea: Secondary | ICD-10-CM | POA: Diagnosis not present

## 2015-09-14 DIAGNOSIS — D1801 Hemangioma of skin and subcutaneous tissue: Secondary | ICD-10-CM | POA: Diagnosis not present

## 2015-09-28 DIAGNOSIS — K529 Noninfective gastroenteritis and colitis, unspecified: Secondary | ICD-10-CM | POA: Diagnosis not present

## 2015-09-28 DIAGNOSIS — F39 Unspecified mood [affective] disorder: Secondary | ICD-10-CM | POA: Diagnosis not present
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
## 2015-10-14 ENCOUNTER — Telehealth: Payer: Self-pay

## 2015-10-14 NOTE — Telephone Encounter (Signed)
Patient reports all of her symptoms have resolved and she is advised that she can cancel if she is not having bloating, abdominal pain, or diarrhea.  She reports she takes imodium once or twice a week even though she does not have diarrhea "just in case".  She is advised that she should stop imodium and take only if she needs.  She will try stopping the imodium and if she remains without diarrhea she will call back and cancel.

## 2015-10-14 NOTE — Telephone Encounter (Signed)
Pt states she was having some GI discomfort following her recent surgery and her GYN wanted her to see GI. Pt states her GYN doc was to send a letter regarding her symptoms. She states she is no longer having the symptoms but wants to know if she needs to keep her appt.

## 2015-10-28 ENCOUNTER — Encounter: Payer: Self-pay | Admitting: *Deleted

## 2015-11-11 ENCOUNTER — Ambulatory Visit: Payer: Medicare Other | Admitting: Gastroenterology

## 2016-02-11 DIAGNOSIS — Z1231 Encounter for screening mammogram for malignant neoplasm of breast: Secondary | ICD-10-CM | POA: Diagnosis not present

## 2016-02-11 LAB — HM MAMMOGRAPHY

## 2016-02-16 ENCOUNTER — Encounter: Payer: Self-pay | Admitting: Family Medicine

## 2016-04-18 ENCOUNTER — Telehealth: Payer: Self-pay | Admitting: Gastroenterology

## 2016-04-18 NOTE — Telephone Encounter (Signed)
Pt states she has been having diarrhea and not able to control her bowels at times. States she took an Imodium Sunday and had an accident today. Discussed with pt that she can take the Imodium daily and that if it causes her to have some constipation she can try taking it every other day. Pt verbalized understanding.

## 2016-05-23 ENCOUNTER — Encounter (INDEPENDENT_AMBULATORY_CARE_PROVIDER_SITE_OTHER): Payer: Self-pay

## 2016-05-23 ENCOUNTER — Encounter: Payer: Self-pay | Admitting: Gastroenterology

## 2016-05-23 ENCOUNTER — Other Ambulatory Visit (INDEPENDENT_AMBULATORY_CARE_PROVIDER_SITE_OTHER): Payer: Medicare Other

## 2016-05-23 ENCOUNTER — Ambulatory Visit (INDEPENDENT_AMBULATORY_CARE_PROVIDER_SITE_OTHER): Payer: Medicare Other | Admitting: Gastroenterology

## 2016-05-23 VITALS — BP 126/74 | HR 68 | Ht 58.66 in | Wt 143.4 lb

## 2016-05-23 DIAGNOSIS — Z1212 Encounter for screening for malignant neoplasm of rectum: Secondary | ICD-10-CM | POA: Diagnosis not present

## 2016-05-23 DIAGNOSIS — R197 Diarrhea, unspecified: Secondary | ICD-10-CM

## 2016-05-23 DIAGNOSIS — Z1211 Encounter for screening for malignant neoplasm of colon: Secondary | ICD-10-CM | POA: Diagnosis not present

## 2016-05-23 LAB — CBC WITH DIFFERENTIAL/PLATELET
BASOS ABS: 0.1 10*3/uL (ref 0.0–0.1)
Basophils Relative: 0.5 % (ref 0.0–3.0)
EOS ABS: 0.1 10*3/uL (ref 0.0–0.7)
Eosinophils Relative: 1.1 % (ref 0.0–5.0)
HEMATOCRIT: 40.2 % (ref 36.0–46.0)
HEMOGLOBIN: 13.4 g/dL (ref 12.0–15.0)
LYMPHS PCT: 23.3 % (ref 12.0–46.0)
Lymphs Abs: 2.5 10*3/uL (ref 0.7–4.0)
MCHC: 33.3 g/dL (ref 30.0–36.0)
MCV: 85.4 fl (ref 78.0–100.0)
Monocytes Absolute: 0.8 10*3/uL (ref 0.1–1.0)
Monocytes Relative: 7.3 % (ref 3.0–12.0)
Neutro Abs: 7.2 10*3/uL (ref 1.4–7.7)
Neutrophils Relative %: 67.8 % (ref 43.0–77.0)
Platelets: 309 10*3/uL (ref 150.0–400.0)
RBC: 4.71 Mil/uL (ref 3.87–5.11)
RDW: 14.9 % (ref 11.5–15.5)
WBC: 10.7 10*3/uL — AB (ref 4.0–10.5)

## 2016-05-23 LAB — COMPREHENSIVE METABOLIC PANEL
ALBUMIN: 4.6 g/dL (ref 3.5–5.2)
ALK PHOS: 49 U/L (ref 39–117)
ALT: 18 U/L (ref 0–35)
AST: 18 U/L (ref 0–37)
BILIRUBIN TOTAL: 0.4 mg/dL (ref 0.2–1.2)
BUN: 15 mg/dL (ref 6–23)
CO2: 28 mEq/L (ref 19–32)
CREATININE: 0.69 mg/dL (ref 0.40–1.20)
Calcium: 9.8 mg/dL (ref 8.4–10.5)
Chloride: 102 mEq/L (ref 96–112)
GFR: 88.01 mL/min (ref >60.00)
GLUCOSE: 94 mg/dL (ref 70–99)
Potassium: 3.8 mEq/L (ref 3.5–5.1)
SODIUM: 138 meq/L (ref 135–145)
TOTAL PROTEIN: 7.6 g/dL (ref 6.0–8.3)

## 2016-05-23 LAB — TSH: TSH: 1.72 u[IU]/mL (ref 0.35–4.50)

## 2016-05-23 LAB — IGA: IGA: 297 mg/dL (ref 68–378)

## 2016-05-23 MED ORDER — DICYCLOMINE HCL 10 MG PO CAPS: 10.0000 mg | ORAL_CAPSULE | Freq: Four times a day (QID) | ORAL | 11 refills | Status: DC | PRN

## 2016-05-23 NOTE — Patient Instructions (Addendum)
We have sent the following medications to your pharmacy for you to pick up at your convenience: Bentyl.  Your physician has requested that you go to the basement for lab work before leaving today.  You can purchase over the counter preparation H with hydrocortisone cream to use rectally twice daily.   You have been given a [place] diet to follow.   Thank you for choosing me and [facility].  Pricilla Riffle. Dagoberto Ligas., MD., Marval Regal

## 2016-05-23 NOTE — Progress Notes (Signed)
    History of Present Illness: This is a 75 year old female with a long history of IBS. She relates that over the past year her diarrhea has been more active. She has infrequent mild episodes of lower abdominal cramping. She takes an over-the-counter antidiarrheal agent which tends to cause hard stools and when she strains and passes a hard stool she notes a mild rectal discomfort. She denies any recent medication changes, recent travel or recent antibiotic usage. Denies weight loss, change in stool caliber, melena, hematochezia, nausea, vomiting, dysphagia, reflux symptoms, chest pain.  Current Medications, Allergies, Past Medical History, Past Surgical History, Family History and Social History were reviewed in [employer] record.  Physical Exam: General: Well developed, well nourished, no acute distress Head: Normocephalic and atraumatic Eyes:  sclerae anicteric, EOMI Ears: Normal auditory acuity Mouth: No deformity or lesions Lungs: Clear throughout to auscultation Heart: Regular rate and rhythm; no murmurs, rubs or bruits Abdomen: Soft, non tender and non distended. No masses, hepatosplenomegaly or hernias noted. Normal Bowel sounds Musculoskeletal: Symmetrical with no gross deformities  Pulses:  Normal pulses noted Extremities: No clubbing, cyanosis, edema or deformities noted Neurological: Alert oriented x 4, grossly nonfocal Psychological:  Alert and cooperative. Normal mood and affect  Assessment and Recommendations:  1. IBS-D. CBC, CMP, tTG, IgA, CMP today. GI pathogen panel. Dicyclomine 10 mg po qid prn. Imodium daily prn. Prep H 1% HC cream bid. Rectal care instruction. Low FODMAP diet. Call if symptoms not improved in the next few weeks.   2. CRC screening, average risk. 10 year colonoscopy is due in 07/2016.

## 2016-05-24 LAB — TISSUE TRANSGLUTAMINASE, IGA: TISSUE TRANSGLUTAMINASE AB, IGA: 1 U/mL (ref <4)

## 2016-05-29 ENCOUNTER — Other Ambulatory Visit: Payer: Medicare Other

## 2016-05-29 DIAGNOSIS — R197 Diarrhea, unspecified: Secondary | ICD-10-CM | POA: Diagnosis not present

## 2016-05-31 ENCOUNTER — Encounter: Payer: Self-pay | Admitting: Gastroenterology

## 2016-06-01 LAB — GASTROINTESTINAL PATHOGEN PANEL PCR
C. DIFFICILE TOX A/B, PCR: NOT DETECTED
CAMPYLOBACTER, PCR: NOT DETECTED
CRYPTOSPORIDIUM, PCR: NOT DETECTED
E COLI (STEC) STX1/STX2, PCR: NOT DETECTED
E COLI 0157, PCR: NOT DETECTED
E coli (ETEC) LT/ST PCR: NOT DETECTED
Giardia lamblia, PCR: NOT DETECTED
Norovirus, PCR: NOT DETECTED
Rotavirus A, PCR: NOT DETECTED
SALMONELLA, PCR: NOT DETECTED
Shigella, PCR: NOT DETECTED

## 2016-07-12 NOTE — Progress Notes (Signed)
Medicare Annual Preventive Care Visit  (initial annual wellness or annual wellness exam)  Concerns and/or follow up today:  Depression: -positive depression screen by assistant 10/2014 -reports continued mild depression her whole life, continues to declined assistance, medications or to do cbt -cares for husband whom has lung cancer and whom had stroke but feels is doing ok -denies: SI, thoughts of self harm, concerns for safety  Itchy Ears: -intermittent -hx ear wax issues and wants lavage to clean them -denies hearing loss, pain, fevers, drainage  Mild borderline diabetes: -no meds, diet controlled -reports daily exercise and working on low carb diet, however she likes canned mandarin oranges -had CMP 04/2016 along with cbc and tsh with GI  Vaginal bleeding Bleeding: -referred to gyn in 2016 -reports  ROS: negative for report of fevers, unintentional weight loss, vision changes, vision loss, hearing loss or change, chest pain, sob, hemoptysis, melena, hematochezia, hematuria, genital discharge or lesions, falls, bleeding or bruising, loc, thoughts of suicide or self harm, memory loss  1.) Patient-completed health risk assessment  - completed and reviewed, see scanned documentation  2.) Review of Medical History: -PMH, PSH, Family History and current specialty and care providers reviewed and updated and listed below  - see scanned in document in chart and below  Past Medical History:  Diagnosis Date  . Anxiety    no meds  . Arthritis    back and fingers   . Bruxism   . Colon polyps   . Depression    no meds  . Diverticulosis of colon (without mention of hemorrhage)   . Fibroid uterus   . History of D&C    x2  . Hx of diverticulitis of colon 03/30/2014  . Hyperglycemia   . Irritable bowel syndrome   . Tobacco abuse   . Unspecified hemorrhoids without mention of complication     Past Surgical History:  Procedure Laterality Date  . APPENDECTOMY  1957  . bce    .  CHOLECYSTECTOMY  2009  . DILATION AND CURETTAGE OF UTERUS    . fibroid removed from uterus    . HYSTEROSCOPY W/D&C N/A 09/09/2015   Procedure: DILATATION AND CURETTAGE /HYSTEROSCOPY;  Surgeon: Vanessa Kick, MD;  Location: [facility];  Service: Gynecology;  Laterality: N/A;    Social History   Social History  . Marital status: Married    Spouse name: N/A  . Number of children: 2  . Years of education: N/A   Occupational History  . retired     Press photographer   Social History Main Topics  . Smoking status: Former Smoker    Packs/day: 1.00    Years: 50.00    Types: Cigarettes    Quit date: 07/23/2008  . Smokeless tobacco: Never Used  . Alcohol use No  . Drug use: No  . Sexual activity: Not Currently   Other Topics Concern  . 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 Heart disease Mother     CHF  . Diverticulosis Mother   . Cancer Father     oral  Current Outpatient Prescriptions on File Prior to Visit  Medication Sig Dispense Refill  . aspirin 81 MG tablet Take 81 mg by mouth daily.      . cholecalciferol (VITAMIN D) 1000 UNITS tablet Take 1,000 Units by mouth daily.    Marland Kitchen dicyclomine (BENTYL) 10 MG capsule Take 1 capsule (10 mg total) by mouth 4 (four) times daily as needed for spasms. 120 capsule 11  . ibuprofen (ADVIL,MOTRIN) 200 MG tablet Take 200 mg by mouth every 6 (six) hours as needed for headache or mild pain.    . metroNIDAZOLE (METROGEL) 0.75 % gel Apply 1 application topically at bedtime. Applies to face for rosacea    . mometasone (NASONEX) 50 MCG/ACT nasal spray Place 2 sprays into the nose daily as needed (for congestion).    . NON FORMULARY Take 2 tablets by mouth daily. OsteoMatrix     . pyridOXINE (VITAMIN B-6) 100 MG tablet Take 100 mg by mouth daily.    . vitamin B-12 (CYANOCOBALAMIN) 100 MCG tablet Take 100 mcg by mouth daily.    . vitamin C (ASCORBIC ACID) 500 MG tablet Take 500 mg by mouth every evening.     . vitamin E (VITAMIN E) 400 UNIT capsule Take 400 Units by mouth daily.      . Wheat Dextrin (BENEFIBER DRINK MIX PO) Take 1 each by mouth 2 (two) times daily.     No current facility-administered medications on file prior to visit.      3.) Review of functional ability and level of safety:  Any difficulty hearing?  NO  History of falling?  NO  Any trouble with IADLs - using a phone, using transportation, grocery shopping, preparing meals, doing housework, doing laundry, taking medications and managing money? NO  Advance Directives? Yes  See summary of recommendations in Patient Instructions below.  4.) Physical Exam Vitals:   07/13/16 0812  BP: 122/72  Pulse: 76  Temp: 98.1 F (36.7 C)   Estimated body mass index is 28.97 kg/m as calculated from the following:   Height as of this encounter: 4' 10.75" (1.492 m).   Weight as of this encounter: 142 lb 3.2 oz (64.5 kg). Body mass index is 28.97 kg/m.  EKG (optional): deferred  General: alert, appear well hydrated and in no acute distress  HEENT: visual acuity grossly intact, small amount of soft cerumen in R ear canal - some remove with soft curette, but pt with sensitive ear canal so procedure stopped - she requested lavage, but also then asked assistant to stop so small amount soft wax, non-obstructive remains in r ear canal - not other abnormality of ear canals or TMs  CV: HRRR, no LE edema  ABD: BS= , soft, NTTP  Lungs: CTA bilaterally  Psych: pleasant and cooperative, no obvious depression or anxiety  Cognitive function grossly intact  See patient instructions for recommendations.  Education and counseling regarding the above review of health provided with a plan for the  following: -see scanned patient completed form for further details -fall prevention strategies discussed  -healthy lifestyle discussed -importance and resources for completing advanced directives discussed -see patient instructions below for any other recommendations provided  4)The following written screening schedule of preventive measures were reviewed with assessment and plan made per below, orders and patient instructions:      AAA screening done if applicable     Alcohol screening done     Obesity Screening and counseling done     STI screening (Hep C  if born [birth year]) offered and per pt wishes     Tobacco Screening done done       Pneumococcal (PPSV23 -one dose after 31, one before if risk factors), influenza yearly and hepatitis B vaccines (if high risk - end stage renal disease, IV drugs, homosexual men, live in home for mentally retarded, hemophilia receiving factors) ASSESSMENT/PLAN: done if applicable      Screening mammograph (yearly if >40) ASSESSMENT/PLAN: utd, 01/2016 [facility]      Screening Pap smear/pelvic exam (q2 years) ASSESSMENT/PLAN: n/a, declined, sees gyn      Colorectal cancer screening (FOBT yearly or flex sig q4y or colonoscopy q10y or barium enema q4y) ASSESSMENT/PLAN: utd, sees GI, due 07/2016      Diabetes outpatient self-management training services ASSESSMENT/PLAN: utd or done      Bone mass measurements(covered q2y if indicated - estrogen def, osteoporosis, hyperparathyroid, vertebral abnormalities, osteoporosis or steroids) ASSESSMENT/PLAN: utd or discussed and ordered per pt wishes      Screening for glaucoma(q1y if high risk - diabetes, FH, AA and > 50 or hispanic and > 65) ASSESSMENT/PLAN: utd or advised      Medical nutritional therapy for individuals with diabetes or renal disease ASSESSMENT/PLAN: see orders      Cardiovascular screening blood tests (lipids q5y) ASSESSMENT/PLAN: see orders and labs      Diabetes screening  tests ASSESSMENT/PLAN: see orders and labs   7.) Summary: -risk factors and conditions per above assessment were discussed and treatment, recommendations and referrals were offered per documentation above and orders and patient instructions.  Medicare annual wellness visit, subsequent  Major depressive disorder, recurrent episode, mild (HCC)  Hyperglycemia - Plan: Hemoglobin A1c  Cerumen impaction, right -pt request removal and gentle attempt with soft curette removed most of the wax, very sensitive ear canal and then she request lavage - but did not wish to continue; small amount non obstructive wax remains. Normal ear canal after lavage o/w.  BMI 28.0-28.9,adult - Plan: Lipid Panel -lifestyle recs  Patient Instructions  BEFORE YOU LEAVE: -ear lavage -flu shot -follow up: 4- 6 months -labs  We have ordered labs or studies at this visit. It can take up to 1-2 weeks for results and processing. IF results require follow up or explanation, we will call you with instructions. Clinically stable results will be released to your [facility]. If you have not heard from [place] or cannot find your results in [facility] in 2 weeks please contact our office at [phone].  If you are not yet signed up for [facility], please consider signing up.  Vit D3 800 IU daily and regular weight bearing exercise advised.  Advise mammogram yearly.  Advise that you discuss need for any further colon cancer screening and recs with your gastroenterologist.  We recommend the following healthy lifestyle for LIFE: 1) Small portions.   Tip: eat off of a salad plate instead of a dinner plate.  Tip: It is ok to feel hungry after a meal - that likely means you ate an appropriate portion.  Tip: if you need more or a snack choose fruits, veggies and/or a handful of nuts or seeds.  2) Eat a healthy clean diet.  * Tip: Avoid (less then 1 serving per week): processed foods, sweets, sweetened drinks, white starches (rice,  flour, bread, potatoes, pasta, etc), red meat, fast foods, butter  *Tip: CHOOSE instead   * 5-9 servings per day of fresh or frozen fruits and vegetables (but not corn, potatoes, bananas, canned or dried  fruit)   *nuts and seeds, beans   *olives and olive oil   *small portions of lean meats such as fish and white chicken    *small portions of whole grains  3)Get at least 150 minutes of sweaty aerobic exercise per week.  4)Reduce stress - consider counseling, meditation and relaxation to balance other aspects of your life.            Colin Benton R., DO

## 2016-07-13 ENCOUNTER — Encounter: Payer: Medicare Other | Admitting: Family Medicine

## 2016-07-13 ENCOUNTER — Ambulatory Visit (INDEPENDENT_AMBULATORY_CARE_PROVIDER_SITE_OTHER): Payer: Medicare Other | Admitting: Family Medicine

## 2016-07-13 ENCOUNTER — Encounter: Payer: Self-pay | Admitting: Family Medicine

## 2016-07-13 VITALS — BP 122/72 | HR 76 | Temp 98.1°F | Ht 58.75 in | Wt 142.2 lb

## 2016-07-13 DIAGNOSIS — Z0000 Encounter for general adult medical examination without abnormal findings: Secondary | ICD-10-CM | POA: Diagnosis not present

## 2016-07-13 DIAGNOSIS — Z23 Encounter for immunization: Secondary | ICD-10-CM | POA: Diagnosis not present

## 2016-07-13 DIAGNOSIS — R739 Hyperglycemia, unspecified: Secondary | ICD-10-CM | POA: Diagnosis not present

## 2016-07-13 DIAGNOSIS — H6121 Impacted cerumen, right ear: Secondary | ICD-10-CM

## 2016-07-13 DIAGNOSIS — F330 Major depressive disorder, recurrent, mild: Secondary | ICD-10-CM

## 2016-07-13 DIAGNOSIS — Z6828 Body mass index (BMI) 28.0-28.9, adult: Secondary | ICD-10-CM | POA: Diagnosis not present

## 2016-07-13 LAB — LIPID PANEL
Cholesterol: 168 mg/dL (ref 0–200)
HDL: 43.5 mg/dL (ref >39.00)
LDL Cholesterol: 102 mg/dL — ABNORMAL HIGH (ref 0–99)
NONHDL: 124.52
TRIGLYCERIDES: 111 mg/dL (ref 0.0–149.0)
Total CHOL/HDL Ratio: 4
VLDL: 22.2 mg/dL (ref 0.0–40.0)

## 2016-07-13 LAB — HEMOGLOBIN A1C: Hgb A1c MFr Bld: 6.4 % (ref 4.6–6.5)

## 2016-07-13 NOTE — Patient Instructions (Addendum)
BEFORE YOU LEAVE: -ear lavage -flu shot -follow up: 4- 6 months -labs  We have ordered labs or studies at this visit. It can take up to 1-2 weeks for results and processing. IF results require follow up or explanation, we will call you with instructions. Clinically stable results will be released to your [facility]. If you have not heard from [place] or cannot find your results in [facility] in 2 weeks please contact our office at [phone].  If you are not yet signed up for [facility], please consider signing up.  Vit D3 800 IU daily and regular weight bearing exercise advised.  Advise mammogram yearly.  Advise that you discuss need for any further colon cancer screening and recs with your gastroenterologist.  We recommend the following healthy lifestyle for LIFE: 1) Small portions.   Tip: eat off of a salad plate instead of a dinner plate.  Tip: It is ok to feel hungry after a meal - that likely means you ate an appropriate portion.  Tip: if you need more or a snack choose fruits, veggies and/or a handful of nuts or seeds.  2) Eat a healthy clean diet.  * Tip: Avoid (less then 1 serving per week): processed foods, sweets, sweetened drinks, white starches (rice, flour, bread, potatoes, pasta, etc), red meat, fast foods, butter  *Tip: CHOOSE instead   * 5-9 servings per day of fresh or frozen fruits and vegetables (but not corn, potatoes, bananas, canned or dried fruit)   *nuts and seeds, beans   *olives and olive oil   *small portions of lean meats such as fish and white chicken    *small portions of whole grains  3)Get at least 150 minutes of sweaty aerobic exercise per week.  4)Reduce stress - consider counseling, meditation and relaxation to balance other aspects of your life.

## 2016-09-12 DIAGNOSIS — D1801 Hemangioma of skin and subcutaneous tissue: Secondary | ICD-10-CM | POA: Diagnosis not present

## 2016-09-12 DIAGNOSIS — L298 Other pruritus: Secondary | ICD-10-CM | POA: Diagnosis not present

## 2016-09-12 DIAGNOSIS — C44712 Basal cell carcinoma of skin of right lower limb, including hip: Secondary | ICD-10-CM | POA: Diagnosis not present

## 2016-09-12 DIAGNOSIS — L821 Other seborrheic keratosis: Secondary | ICD-10-CM | POA: Diagnosis not present

## 2016-09-12 DIAGNOSIS — Z85828 Personal history of other malignant neoplasm of skin: Secondary | ICD-10-CM | POA: Diagnosis not present

## 2016-09-12 DIAGNOSIS — L718 Other rosacea: Secondary | ICD-10-CM | POA: Diagnosis not present

## 2016-09-12 DIAGNOSIS — L853 Xerosis cutis: Secondary | ICD-10-CM | POA: Diagnosis not present

## 2016-09-12 DIAGNOSIS — D485 Neoplasm of uncertain behavior of skin: Secondary | ICD-10-CM | POA: Diagnosis not present

## 2016-09-28 DIAGNOSIS — C44712 Basal cell carcinoma of skin of right lower limb, including hip: Secondary | ICD-10-CM | POA: Diagnosis not present

## 2016-09-28 DIAGNOSIS — Z85828 Personal history of other malignant neoplasm of skin: Secondary | ICD-10-CM | POA: Diagnosis not present

## 2017-02-06 DIAGNOSIS — H9202 Otalgia, left ear: Secondary | ICD-10-CM | POA: Insufficient documentation

## 2017-02-06 DIAGNOSIS — J342 Deviated nasal septum: Secondary | ICD-10-CM | POA: Diagnosis not present

## 2017-02-12 DIAGNOSIS — Z1231 Encounter for screening mammogram for malignant neoplasm of breast: Secondary | ICD-10-CM | POA: Diagnosis not present

## 2017-02-12 LAB — HM MAMMOGRAPHY

## 2017-02-20 ENCOUNTER — Encounter: Payer: Self-pay | Admitting: Family Medicine

## 2017-05-31 ENCOUNTER — Other Ambulatory Visit: Payer: Self-pay | Admitting: Gastroenterology

## 2017-07-16 ENCOUNTER — Encounter: Payer: Self-pay | Admitting: Family Medicine
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
## 2017-07-17 ENCOUNTER — Ambulatory Visit (INDEPENDENT_AMBULATORY_CARE_PROVIDER_SITE_OTHER): Payer: Medicare Other | Admitting: Family Medicine

## 2017-07-17 ENCOUNTER — Encounter: Payer: Self-pay | Admitting: Family Medicine

## 2017-07-17 VITALS — BP 118/68 | HR 68 | Temp 98.5°F | Ht 59.0 in | Wt 146.3 lb

## 2017-07-17 DIAGNOSIS — I700 Atherosclerosis of aorta: Secondary | ICD-10-CM | POA: Diagnosis not present

## 2017-07-17 DIAGNOSIS — Z1331 Encounter for screening for depression: Secondary | ICD-10-CM

## 2017-07-17 DIAGNOSIS — M545 Low back pain: Secondary | ICD-10-CM | POA: Diagnosis not present

## 2017-07-17 DIAGNOSIS — Z6829 Body mass index (BMI) 29.0-29.9, adult: Secondary | ICD-10-CM

## 2017-07-17 DIAGNOSIS — Z1389 Encounter for screening for other disorder: Secondary | ICD-10-CM | POA: Diagnosis not present

## 2017-07-17 DIAGNOSIS — Z8719 Personal history of other diseases of the digestive system: Secondary | ICD-10-CM | POA: Diagnosis not present

## 2017-07-17 DIAGNOSIS — D72829 Elevated white blood cell count, unspecified: Secondary | ICD-10-CM

## 2017-07-17 DIAGNOSIS — Z0000 Encounter for general adult medical examination without abnormal findings: Secondary | ICD-10-CM | POA: Diagnosis not present

## 2017-07-17 DIAGNOSIS — G8929 Other chronic pain: Secondary | ICD-10-CM | POA: Diagnosis not present

## 2017-07-17 DIAGNOSIS — R739 Hyperglycemia, unspecified: Secondary | ICD-10-CM

## 2017-07-17 DIAGNOSIS — Z23 Encounter for immunization: Secondary | ICD-10-CM

## 2017-07-17 LAB — BASIC METABOLIC PANEL
BUN: 18 mg/dL (ref 6–23)
CALCIUM: 9.8 mg/dL (ref 8.4–10.5)
CO2: 22 mEq/L (ref 19–32)
Chloride: 104 mEq/L (ref 96–112)
Creatinine, Ser: 0.69 mg/dL (ref 0.40–1.20)
GFR: 87.74 mL/min (ref >60.00)
Glucose, Bld: 134 mg/dL — ABNORMAL HIGH (ref 70–99)
POTASSIUM: 4.3 meq/L (ref 3.5–5.1)
SODIUM: 138 meq/L (ref 135–145)

## 2017-07-17 LAB — LIPID PANEL
CHOLESTEROL: 172 mg/dL (ref 0–200)
HDL: 48.1 mg/dL (ref >39.00)
LDL Cholesterol: 102 mg/dL — ABNORMAL HIGH (ref 0–99)
NONHDL: 124.11
Total CHOL/HDL Ratio: 4
Triglycerides: 112 mg/dL (ref 0.0–149.0)
VLDL: 22.4 mg/dL (ref 0.0–40.0)

## 2017-07-17 LAB — CBC WITH DIFFERENTIAL/PLATELET
BASOS PCT: 0.7 % (ref 0.0–3.0)
Basophils Absolute: 0 10*3/uL (ref 0.0–0.1)
EOS PCT: 1.5 % (ref 0.0–5.0)
Eosinophils Absolute: 0.1 10*3/uL (ref 0.0–0.7)
HEMATOCRIT: 42.2 % (ref 36.0–46.0)
HEMOGLOBIN: 13.7 g/dL (ref 12.0–15.0)
LYMPHS PCT: 24.4 % (ref 12.0–46.0)
Lymphs Abs: 1.6 10*3/uL (ref 0.7–4.0)
MCHC: 32.6 g/dL (ref 30.0–36.0)
MCV: 88.9 fl (ref 78.0–100.0)
Monocytes Absolute: 0.5 10*3/uL (ref 0.1–1.0)
Monocytes Relative: 8 % (ref 3.0–12.0)
Neutro Abs: 4.2 10*3/uL (ref 1.4–7.7)
Neutrophils Relative %: 65.4 % (ref 43.0–77.0)
Platelets: 292 10*3/uL (ref 150.0–400.0)
RBC: 4.75 Mil/uL (ref 3.87–5.11)
RDW: 14.3 % (ref 11.5–15.5)
WBC: 6.4 10*3/uL (ref 4.0–10.5)

## 2017-07-17 LAB — HEMOGLOBIN A1C: HEMOGLOBIN A1C: 6.5 % (ref 4.6–6.5)

## 2017-07-17 NOTE — Patient Instructions (Signed)
BEFORE YOU LEAVE: -flu shot -low back exercises -Order Cologuard test for colon cancer screening -Labs -follow MG:QQPYP your back in 1-2 months, you did have x-rays of the back in 2015 in need to have lumbar degenerative disc disease.  We ordered the Cologuard test for colon cancer screening. Please complete this test promptly once the kit arrives. Please contact us if you have not received your kit in the next few weeks.  For the back pain: -For pain you may use the Tylenol one to 2 times daily, Aleve 1-2 times per week, a Rice sock for heat and topical menthol ( Tiger balm is a good option) -Continue the low back strengthening program at least 4-5 days per week  -start back to getting some regular weightbearing exercise such as walking, would start with 5 minutes per day and slowly increase to 20 minutes per day -Take vitamin D3 1000 international units daily, [employer] or [employer] are good options that were approved by consumer labs -consider osteopathic treatments and schedule an osteopathic treatment appointment with me if you wish to do this - 30 minutes, first her last appointment of the day -Eat a healthy low sugar diet, eat lots of vegetables and lean proteins such as fish and chicken breasts, drink plenty of water and do not eat or drink foods with chemicals ( avoid processed foods)  We have ordered labs or studies at this visit. It can take up to 1-2 weeks for results and processing. IF results require follow up or explanation, we will call you with instructions. Clinically stable results will be released to your [facility]. If you have not heard from [place] or cannot find your results in [facility] in 2 weeks please contact our office at [phone].  If you are not yet signed up for [facility] please consider signing up.   Health Maintenance for Postmenopausal Women Menopause is a normal process in which your reproductive ability comes to an end. This process happens gradually over a span  of months to years, usually between the ages of 20 and 29. Menopause is complete when you have missed 12 consecutive menstrual periods. It is important to talk with your health care provider about some of the most common conditions that affect postmenopausal women, such as heart disease, cancer, and bone loss (osteoporosis). Adopting a healthy lifestyle and getting preventive care can help to promote your health and wellness. Those actions can also lower your chances of developing some of these common conditions. What should I know about menopause? During menopause, you may experience a number of symptoms, such as:  Moderate-to-severe hot flashes.  Night sweats.  Decrease in sex drive.  Mood swings.  Headaches.  Tiredness.  Irritability.  Memory problems.  Insomnia.  Choosing to treat or not to treat menopausal changes is an individual decision that you make with your health care provider. What should I know about hormone replacement therapy and supplements? Hormone therapy products are effective for treating symptoms that are associated with menopause, such as hot flashes and night sweats. Hormone replacement carries certain risks, especially as you become older. If you are thinking about using estrogen or estrogen with progestin treatments, discuss the benefits and risks with your health care provider. What should I know about heart disease and stroke? Heart disease, heart attack, and stroke become more likely as you age. This may be due, in part, to the hormonal changes that your body experiences during menopause. These can affect how your body processes dietary fats, triglycerides,  and cholesterol. Heart attack and stroke are both medical emergencies. There are many things that you can do to help prevent heart disease and stroke:  Have your blood pressure checked at least every 1-2 years. High blood pressure causes heart disease and increases the risk of stroke.  If you are 67-80  years old, ask your health care provider if you should take aspirin to prevent a heart attack or a stroke.  Do not use any tobacco products, including cigarettes, chewing tobacco, or electronic cigarettes. If you need help quitting, ask your health care provider.  It is important to eat a healthy diet and maintain a healthy weight. ? Be sure to include plenty of vegetables, fruits, low-fat dairy products, and lean protein. ? Avoid eating foods that are high in solid fats, added sugars, or salt (sodium).  Get regular exercise. This is one of the most important things that you can do for your health. ? Try to exercise for at least 150 minutes each week. The type of exercise that you do should increase your heart rate and make you sweat. This is known as moderate-intensity exercise. ? Try to do strengthening exercises at least twice each week. Do these in addition to the moderate-intensity exercise.  Know your numbers.Ask your health care provider to check your cholesterol and your blood glucose. Continue to have your blood tested as directed by your health care provider.  What should I know about cancer screening? There are several types of cancer. Take the following steps to reduce your risk and to catch any cancer development as early as possible. Breast Cancer  Practice breast self-awareness. ? This means understanding how your breasts normally appear and feel. ? It also means doing regular breast self-exams. Let your health care provider know about any changes, no matter how small.  If you are 43 or older, have a clinician do a breast exam (clinical breast exam or CBE) every year. Depending on your age, family history, and medical history, it may be recommended that you also have a yearly breast X-ray (mammogram).  If you have a family history of breast cancer, talk with your health care provider about genetic screening.  If you are at high risk for breast cancer, talk with your health  care provider about having an MRI and a mammogram every year.  Breast cancer (BRCA) gene test is recommended for women who have family members with BRCA-related cancers. Results of the assessment will determine the need for genetic counseling and BRCA1 and for BRCA2 testing. BRCA-related cancers include these types: ? Breast. This occurs in males or females. ? Ovarian. ? Tubal. This may also be called fallopian tube cancer. ? Cancer of the abdominal or pelvic lining (peritoneal cancer). ? Prostate. ? Pancreatic.  Cervical, Uterine, and Ovarian Cancer Your health care provider may recommend that you be screened regularly for cancer of the pelvic organs. These include your ovaries, uterus, and vagina. This screening involves a pelvic exam, which includes checking for microscopic changes to the surface of your cervix (Pap test).  For women ages 21-65, health care providers may recommend a pelvic exam and a Pap test every three years. For women ages 54-65, they may recommend the Pap test and pelvic exam, combined with testing for human papilloma virus (HPV), every five years. Some types of HPV increase your risk of cervical cancer. Testing for HPV may also be done on women of any age who have unclear Pap test results.  Other health care providers  may not recommend any screening for nonpregnant women who are considered low risk for pelvic cancer and have no symptoms. Ask your health care provider if a screening pelvic exam is right for you.  If you have had past treatment for cervical cancer or a condition that could lead to cancer, you need Pap tests and screening for cancer for at least 20 years after your treatment. If Pap tests have been discontinued for you, your risk factors (such as having a new sexual partner) need to be reassessed to determine if you should start having screenings again. Some women have medical problems that increase the chance of getting cervical cancer. In these cases, your  health care provider may recommend that you have screening and Pap tests more often.  If you have a family history of uterine cancer or ovarian cancer, talk with your health care provider about genetic screening.  If you have vaginal bleeding after reaching menopause, tell your health care provider.  There are currently no reliable tests available to screen for ovarian cancer.  Lung Cancer Lung cancer screening is recommended for adults 92-32 years old who are at high risk for lung cancer because of a history of smoking. A yearly low-dose CT scan of the lungs is recommended if you:  Currently smoke.  Have a history of at least 30 pack-years of smoking and you currently smoke or have quit within the past 15 years. A pack-year is smoking an average of one pack of cigarettes per day for one year.  Yearly screening should:  Continue until it has been 15 years since you quit.  Stop if you develop a health problem that would prevent you from having lung cancer treatment.  Colorectal Cancer  This type of cancer can be detected and can often be prevented.  Routine colorectal cancer screening usually begins at age 11 and continues through age 40.  If you have risk factors for colon cancer, your health care provider may recommend that you be screened at an earlier age.  If you have a family history of colorectal cancer, talk with your health care provider about genetic screening.  Your health care provider may also recommend using home test kits to check for hidden blood in your stool.  A small camera at the end of a tube can be used to examine your colon directly (sigmoidoscopy or colonoscopy). This is done to check for the earliest forms of colorectal cancer.  Direct examination of the colon should be repeated every 5-10 years until age 67. However, if early forms of precancerous polyps or small growths are found or if you have a family history or genetic risk for colorectal cancer, you  may need to be screened more often.  Skin Cancer  Check your skin from head to toe regularly.  Monitor any moles. Be sure to tell your health care provider: ? About any new moles or changes in moles, especially if there is a change in a mole's shape or color. ? If you have a mole that is larger than the size of a pencil eraser.  If any of your family members has a history of skin cancer, especially at a young age, talk with your health care provider about genetic screening.  Always use sunscreen. Apply sunscreen liberally and repeatedly throughout the day.  Whenever you are outside, protect yourself by wearing long sleeves, pants, a wide-brimmed hat, and sunglasses.  What should I know about osteoporosis? Osteoporosis is a condition in which bone destruction  happens more quickly than new bone creation. After menopause, you may be at an increased risk for osteoporosis. To help prevent osteoporosis or the bone fractures that can happen because of osteoporosis, the following is recommended:  If you are 60-65 years old, get at least 1,000 mg of calcium and at least 600 mg of vitamin D per day.  If you are older than age 62 but younger than age 8, get at least 1,200 mg of calcium and at least 600 mg of vitamin D per day.  If you are older than age 54, get at least 1,200 mg of calcium and at least 800 mg of vitamin D per day.  Smoking and excessive alcohol intake increase the risk of osteoporosis. Eat foods that are rich in calcium and vitamin D, and do weight-bearing exercises several times each week as directed by your health care provider. What should I know about how menopause affects my mental health? Depression may occur at any age, but it is more common as you become older. Common symptoms of depression include:  Low or sad mood.  Changes in sleep patterns.  Changes in appetite or eating patterns.  Feeling an overall lack of motivation or enjoyment of activities that you  previously enjoyed.  Frequent crying spells.  Talk with your health care provider if you think that you are experiencing depression. What should I know about immunizations? It is important that you get and maintain your immunizations. These include:  Tetanus, diphtheria, and pertussis (Tdap) booster vaccine.  Influenza every year before the flu season begins.  Pneumonia vaccine.  Shingles vaccine.  Your health care provider may also recommend other immunizations. This information is not intended to replace advice given to you by your health care provider. Make sure you discuss any questions you have with your health care provider. Document Released: 12/08/2005 Document Revised: 05/05/2016 Document Reviewed: 07/20/2015 Elsevier Interactive Patient Education  2018 [employer].

## 2017-07-24 DIAGNOSIS — Z1211 Encounter for screening for malignant neoplasm of colon: Secondary | ICD-10-CM | POA: Diagnosis not present

## 2017-07-24 DIAGNOSIS — Z1212 Encounter for screening for malignant neoplasm of rectum: Secondary | ICD-10-CM | POA: Diagnosis not present

## 2017-07-24 LAB — COLOGUARD

## 2017-07-30 ENCOUNTER — Telehealth: Payer: Self-pay

## 2017-07-30 DIAGNOSIS — R195 Other fecal abnormalities: Secondary | ICD-10-CM

## 2017-07-30 NOTE — Telephone Encounter (Signed)
Ok please let pt know and would advise urgent referral to GI. Please place referral.Thanks.

## 2017-07-30 NOTE — Telephone Encounter (Signed)
Calling to advise pt's Cologuard came back POSITIVE. They did fax result letter 07/27/17.  Dr. Maudie Mercury - FYI. Thanks!

## 2017-07-31 NOTE — Telephone Encounter (Signed)
Please follow up.  Thanks

## 2017-07-31 NOTE — Telephone Encounter (Signed)
Patient called back and I informed her of the results.  She is aware someone will call with appt info.

## 2017-07-31 NOTE — Telephone Encounter (Signed)
I left a message for the pt to return my call. 

## 2017-08-01 DIAGNOSIS — M545 Low back pain: Secondary | ICD-10-CM | POA: Diagnosis not present

## 2017-08-01 DIAGNOSIS — M4126 Other idiopathic scoliosis, lumbar region: Secondary | ICD-10-CM | POA: Diagnosis not present

## 2017-08-01 DIAGNOSIS — M5136 Other intervertebral disc degeneration, lumbar region: Secondary | ICD-10-CM | POA: Diagnosis not present

## 2017-08-02 ENCOUNTER — Ambulatory Visit: Payer: Medicare Other | Admitting: Family Medicine

## 2017-08-08 DIAGNOSIS — M5136 Other intervertebral disc degeneration, lumbar region: Secondary | ICD-10-CM | POA: Diagnosis not present

## 2017-08-13 DIAGNOSIS — M5136 Other intervertebral disc degeneration, lumbar region: Secondary | ICD-10-CM | POA: Diagnosis not present

## 2017-08-16 DIAGNOSIS — M5136 Other intervertebral disc degeneration, lumbar region: Secondary | ICD-10-CM | POA: Diagnosis not present

## 2017-08-20 ENCOUNTER — Telehealth: Payer: Self-pay | Admitting: Gastroenterology

## 2017-08-20 ENCOUNTER — Other Ambulatory Visit (INDEPENDENT_AMBULATORY_CARE_PROVIDER_SITE_OTHER): Payer: Medicare Other

## 2017-08-20 ENCOUNTER — Ambulatory Visit (INDEPENDENT_AMBULATORY_CARE_PROVIDER_SITE_OTHER): Payer: Medicare Other | Admitting: Nurse Practitioner

## 2017-08-20 ENCOUNTER — Encounter: Payer: Self-pay | Admitting: Nurse Practitioner

## 2017-08-20 VITALS — BP 110/72 | HR 86 | Ht 59.0 in | Wt 142.0 lb

## 2017-08-20 DIAGNOSIS — K625 Hemorrhage of anus and rectum: Secondary | ICD-10-CM | POA: Diagnosis not present

## 2017-08-20 DIAGNOSIS — K580 Irritable bowel syndrome with diarrhea: Secondary | ICD-10-CM | POA: Diagnosis not present

## 2017-08-20 LAB — CBC WITH DIFFERENTIAL/PLATELET
BASOS PCT: 0.5 % (ref 0.0–3.0)
Basophils Absolute: 0.1 10*3/uL (ref 0.0–0.1)
EOS PCT: 0.4 % (ref 0.0–5.0)
Eosinophils Absolute: 0 10*3/uL (ref 0.0–0.7)
HCT: 41.8 % (ref 36.0–46.0)
HEMOGLOBIN: 13.6 g/dL (ref 12.0–15.0)
LYMPHS ABS: 2.2 10*3/uL (ref 0.7–4.0)
Lymphocytes Relative: 16.9 % (ref 12.0–46.0)
MCHC: 32.5 g/dL (ref 30.0–36.0)
MCV: 88.6 fl (ref 78.0–100.0)
MONO ABS: 1.2 10*3/uL — AB (ref 0.1–1.0)
MONOS PCT: 9.2 % (ref 3.0–12.0)
NEUTROS PCT: 73 % (ref 43.0–77.0)
Neutro Abs: 9.6 10*3/uL — ABNORMAL HIGH (ref 1.4–7.7)
Platelets: 287 10*3/uL (ref 150.0–400.0)
RBC: 4.72 Mil/uL (ref 3.87–5.11)
RDW: 14.2 % (ref 11.5–15.5)
WBC: 13.1 10*3/uL — ABNORMAL HIGH (ref 4.0–10.5)

## 2017-08-20 NOTE — Telephone Encounter (Signed)
Patient reports cramping pain that started on Saturday night with some rectal bleeding. She says had the pain all day yesterday.  Pain has improved, but now she is having occasional cramping and "passing clear fluid".  She will come in today and see Tye Savoy RNP at 2:00

## 2017-08-20 NOTE — Patient Instructions (Signed)
   Your physician has requested that you go to the basement for the following lab work before leaving today: CBC/diff  We will call you with results and plans.    I appreciate the opportunity to care for you.

## 2017-08-20 NOTE — Progress Notes (Signed)
Chief Complaint:  Rectal bleeding.   HPI: Patient is a 76 yo female known to Dr. Fuller Plan for IBS and hx of diverticulitis.  She reports chronic diarrhea, takes Dicyclomine 1 to 3 times a day. Actually her stools are not loose but she has several small soft low volume BMs a day. She is frequently incontinent of stool and this interferes with going places and doing things. Mrs Machamer is emotional /crying today.   Her husband has some health issues. He doesn't help out and she still has to wait on him and take care of her own needs. Ms. Galanti had a positive Cologuard in September. Doesn't want to prep for a colonoscopy because of incontinence and she has no one to help her. She has 2 sons but apparently isn't very close to either one of them. She says all of her friends are dead. Last colonoscopy was 02/16/06 with findings of diverticulosis, hemorrhoids, and non-adenomatous polyp.   Mrs Panchal is here for evaluation of rectal bleeding and generalized mid/lower abdominal pain Symptoms started Saturday night, continued all day Sunday. As usual the stool was soft, unformed but it contained clear jelly like material and blood. The pain has resolved and she hasn't had any bleeding today.   No fever. No recent antibiotics.     Past Medical History:  Diagnosis Date  . Anxiety    no meds  . Arthritis    back and fingers   . Bruxism   . Colon polyps   . DDD (degenerative disc disease), lumbar   . Depression    no meds  . Diverticulosis of colon (without mention of hemorrhage)   . Fibroid uterus   . Headache, paroxysmal hemicrania, episodic 03/24/2014  . History of D&C    x2  . Hx of diverticulitis of colon 03/30/2014  . Hyperglycemia   . Irritable bowel syndrome   . Pulmonary nodules - reports followed by pulmonology, reports following up with them in June 2015 03/26/2013   Repeat CT chest 04/20/14 > 1. Scattered pulmonary nodules measure 4 mm or less in size, are unchanged from 03/28/2013 and are  therefore considered benign    . Tobacco abuse   . Unspecified hemorrhoids without mention of complication     Patient's surgical history, family medical history, social history, medications and allergies were all reviewed in Epic    Physical Exam: BP 110/72   Pulse 86   Ht 4\' 11"  (1.499 m)   Wt 142 lb (64.4 kg)   BMI 28.68 kg/m   GENERAL:  Well developed white female in NAD PSYCH: :Pleasant, cooperative, normal affect EENT:  conjunctiva pink, mucous membranes moist, neck supple without masses CARDIAC:  RRR, no murmur heard, + peripheral edema PULM: Normal respiratory effort, lungs CTA bilaterally, no wheezing ABDOMEN:  Nondistended, soft, mild diffuse lower abdominal tenderness.  No obvious masses, no hepatomegaly,  normal bowel sounds SKIN:  turgor, no lesions seen Musculoskeletal:  Normal muscle tone, normal strength NEURO: Alert and oriented x 3, no focal neurologic deficits   ASSESSMENT and PLAN:  1. Pleasant 76 year old female with IBS. Recent abdominal pain and rectal bleeding of unclear etiology.  Mild diffuse lower abdominal tenderness on exam. Non-toxic appearing. Could have been ischemic. She had some clear jelly like material in the stool of uncertain significance. Sx have resolved.  -cbc -Since sx resolved will not order additional studies. If recurrent sx then consider imaging studies. She refuses colonoscopy  2.  Positive cologuard  in Sept. She refuses colonoscopy. Unfortunately no one to help her prep and she has problems with incontinence. She want put towels done in case of accidents while prepping.   I spent 25 minutes of face-to-face time with the patient. Greater than 50% of the time was spent counseling and coordinating care. Questions answered     Tye Savoy , NP 08/20/2017, 2:50 PM

## 2017-08-27 DIAGNOSIS — M5136 Other intervertebral disc degeneration, lumbar region: Secondary | ICD-10-CM | POA: Diagnosis not present

## 2017-08-27 NOTE — Progress Notes (Signed)
Reviewed and agree with management plan. Please check into the option of an observation admission for bowel prep with colonoscopy the next day.   Pricilla Riffle. Fuller Plan, MD [facility]

## 2017-09-03 ENCOUNTER — Other Ambulatory Visit: Payer: Self-pay

## 2017-09-03 DIAGNOSIS — D72828 Other elevated white blood cell count: Secondary | ICD-10-CM

## 2017-09-07 ENCOUNTER — Other Ambulatory Visit (INDEPENDENT_AMBULATORY_CARE_PROVIDER_SITE_OTHER): Payer: Medicare Other

## 2017-09-07 DIAGNOSIS — D72828 Other elevated white blood cell count: Secondary | ICD-10-CM | POA: Diagnosis not present

## 2017-09-07 LAB — CBC WITH DIFFERENTIAL/PLATELET
BASOS PCT: 0.7 % (ref 0.0–3.0)
Basophils Absolute: 0 10*3/uL (ref 0.0–0.1)
EOS PCT: 0.8 % (ref 0.0–5.0)
Eosinophils Absolute: 0 10*3/uL (ref 0.0–0.7)
HEMATOCRIT: 41.6 % (ref 36.0–46.0)
HEMOGLOBIN: 13.7 g/dL (ref 12.0–15.0)
Lymphocytes Relative: 27.3 % (ref 12.0–46.0)
Lymphs Abs: 1.6 10*3/uL (ref 0.7–4.0)
MCHC: 32.8 g/dL (ref 30.0–36.0)
MCV: 87.7 fl (ref 78.0–100.0)
MONOS PCT: 8.6 % (ref 3.0–12.0)
Monocytes Absolute: 0.5 10*3/uL (ref 0.1–1.0)
Neutro Abs: 3.8 10*3/uL (ref 1.4–7.7)
Neutrophils Relative %: 62.6 % (ref 43.0–77.0)
Platelets: 311 10*3/uL (ref 150.0–400.0)
RBC: 4.75 Mil/uL (ref 3.87–5.11)
RDW: 14.4 % (ref 11.5–15.5)
WBC: 6 10*3/uL (ref 4.0–10.5)

## 2017-09-14 DIAGNOSIS — L570 Actinic keratosis: Secondary | ICD-10-CM | POA: Diagnosis not present

## 2017-09-14 DIAGNOSIS — L718 Other rosacea: Secondary | ICD-10-CM | POA: Diagnosis not present

## 2017-09-14 DIAGNOSIS — L821 Other seborrheic keratosis: Secondary | ICD-10-CM | POA: Diagnosis not present

## 2017-09-14 DIAGNOSIS — L918 Other hypertrophic disorders of the skin: Secondary | ICD-10-CM | POA: Diagnosis not present

## 2017-09-14 DIAGNOSIS — D1801 Hemangioma of skin and subcutaneous tissue: Secondary | ICD-10-CM | POA: Diagnosis not present

## 2017-09-14 DIAGNOSIS — Z85828 Personal history of other malignant neoplasm of skin: Secondary | ICD-10-CM | POA: Diagnosis not present

## 2017-09-17 ENCOUNTER — Telehealth: Payer: Self-pay | Admitting: Nurse Practitioner

## 2017-09-17 NOTE — Telephone Encounter (Signed)
Patient states she is returning nurses call about lab results from 59.9.2018

## 2017-09-18 ENCOUNTER — Ambulatory Visit: Payer: Medicare Other | Admitting: Gastroenterology

## 2017-09-18 ENCOUNTER — Encounter: Payer: Self-pay | Admitting: Gastroenterology

## 2017-09-18 ENCOUNTER — Ambulatory Visit (INDEPENDENT_AMBULATORY_CARE_PROVIDER_SITE_OTHER): Payer: Medicare Other | Admitting: Gastroenterology

## 2017-09-18 VITALS — BP 124/66 | HR 80 | Ht 58.5 in | Wt 145.1 lb

## 2017-09-18 DIAGNOSIS — R195 Other fecal abnormalities: Secondary | ICD-10-CM

## 2017-09-18 DIAGNOSIS — K921 Melena: Secondary | ICD-10-CM | POA: Diagnosis not present

## 2017-09-18 DIAGNOSIS — R197 Diarrhea, unspecified: Secondary | ICD-10-CM | POA: Diagnosis not present

## 2017-09-18 DIAGNOSIS — R159 Full incontinence of feces: Secondary | ICD-10-CM | POA: Diagnosis not present

## 2017-09-18 MED ORDER — COLESTIPOL HCL 1 G PO TABS: 1.0000 g | ORAL_TABLET | Freq: Two times a day (BID) | ORAL | 11 refills | Status: DC

## 2017-09-18 NOTE — Patient Instructions (Signed)
We have sent the following medications to your pharmacy for you to pick up at your convenience: Colestipol.   Take over the counter Imodium twice daily.   Continue your Bentyl.  It has been recommended to you by your physician that you have a(n) Colonoscopy completed. Per your request, we did not schedule the procedure(s) today. Please contact our office at [phone] should you decide to have the procedure completed.   Thank you for choosing me and [facility].  Pricilla Riffle. Dagoberto Ligas., MD., [facility] Regal

## 2017-09-18 NOTE — Progress Notes (Signed)
    History of Present Illness: This is a 76 year old female returning for follow-up of rectal bleeding, chronic diarrhea, frequent fecal incontinence and a positive Cologuard.  She was evaluated by Tye Savoy on October 22 for the same and she declined colonoscopy.  She wants to review her symptoms again today and discuss colonoscopy.  She is very reluctant to proceed with the colonoscopy due to the prep and the procedure itself.  She has had incontinence of watery stool but not solid stool.  She states her stools have improved since using dicyclomine regularly.  Colonoscopy in October 2007 by Dr. Verl Blalock showed left colon diverticulosis, internal and external hemorrhoids, otherwise normal.  Current Medications, Allergies, Past Medical History, Past Surgical History, Family History and Social History were reviewed in [place] record.  Physical Exam: General: Well developed, well nourished, no acute distress Head: Normocephalic and atraumatic Eyes:  sclerae anicteric, EOMI Ears: Normal auditory acuity Mouth: No deformity or lesions Lungs: Clear throughout to auscultation Heart: Regular rate and rhythm; no murmurs, rubs or bruits Abdomen: Soft, non tender and non distended. No masses, hepatosplenomegaly or hernias noted. Normal Bowel sounds Musculoskeletal: Symmetrical with no gross deformities  Pulses:  Normal pulses noted Extremities: No clubbing, cyanosis, edema or deformities noted Neurological: Alert oriented x 4, grossly nonfocal Psychological:  Alert and cooperative. Normal mood and affect  Assessment and Recommendations:  1. Diarrhea, incontinence.  IBS-D. Rule out pelvic floor dysfunction, microscopic colitis, bile salt diarrhea, IBS, etc.  Continue dicyclomine.  Imodium twice daily as needed.  Begin Colestipol 2 g twice daily.  I recommended proceeding with colonoscopy for further evaluation however she is again declining to schedule colonoscopy  with several concerns about the procedure and the prep.  We had an extensive discussion about the newer low volume bowel preps, giving Reglan before each dose of prep and standard protocols for prepping and completing colonoscopy.  She states she will further consider and call us if she decides to proceed.  2. Hematochezia and positive Cologuard.  Rule out colorectal neoplasms including colon cancer.  I recommended proceeding with colonoscopy.  See #1.

## 2017-10-24 ENCOUNTER — Other Ambulatory Visit: Payer: Self-pay | Admitting: Gastroenterology

## 2017-10-31 ENCOUNTER — Encounter: Payer: Self-pay | Admitting: Gastroenterology

## 2017-11-30 HISTORY — PX: OTHER SURGICAL HISTORY: SHX169

## 2017-12-05 ENCOUNTER — Ambulatory Visit (AMBULATORY_SURGERY_CENTER): Payer: Self-pay

## 2017-12-05 VITALS — Ht <= 58 in | Wt 145.0 lb

## 2017-12-05 DIAGNOSIS — R195 Other fecal abnormalities: Secondary | ICD-10-CM

## 2017-12-05 MED ORDER — NA SULFATE-K SULFATE-MG SULF 17.5-3.13-1.6 GM/177ML PO SOLN: 1.0000 | Freq: Once | ORAL | 0 refills | Status: AC

## 2017-12-05 NOTE — Progress Notes (Signed)
Per pt, no allergies to soy or egg products.Pt not taking any weight loss meds or using  O2 at home.  Pt refused emmi  Video.

## 2017-12-14 ENCOUNTER — Emergency Department (HOSPITAL_COMMUNITY): Payer: Medicare Other

## 2017-12-14 ENCOUNTER — Other Ambulatory Visit: Payer: Self-pay

## 2017-12-14 ENCOUNTER — Emergency Department (HOSPITAL_COMMUNITY)
Admission: EM | Admit: 2017-12-14 | Discharge: 2017-12-15 | Disposition: A | Discharge status: Home / Self Care | Payer: Medicare Other | Attending: Emergency Medicine | Admitting: Emergency Medicine

## 2017-12-14 DIAGNOSIS — S299XXA Unspecified injury of thorax, initial encounter: Secondary | ICD-10-CM | POA: Diagnosis not present

## 2017-12-14 DIAGNOSIS — W010XXA Fall on same level from slipping, tripping and stumbling without subsequent striking against object, initial encounter: Secondary | ICD-10-CM | POA: Diagnosis not present

## 2017-12-14 DIAGNOSIS — S22080A Wedge compression fracture of T11-T12 vertebra, initial encounter for closed fracture: Secondary | ICD-10-CM | POA: Diagnosis not present

## 2017-12-14 DIAGNOSIS — Z7982 Long term (current) use of aspirin: Secondary | ICD-10-CM | POA: Diagnosis not present

## 2017-12-14 DIAGNOSIS — Y929 Unspecified place or not applicable: Secondary | ICD-10-CM | POA: Diagnosis not present

## 2017-12-14 DIAGNOSIS — Y939 Activity, unspecified: Secondary | ICD-10-CM | POA: Diagnosis not present

## 2017-12-14 DIAGNOSIS — Y998 Other external cause status: Secondary | ICD-10-CM | POA: Insufficient documentation

## 2017-12-14 DIAGNOSIS — Z79899 Other long term (current) drug therapy: Secondary | ICD-10-CM | POA: Insufficient documentation

## 2017-12-14 DIAGNOSIS — M546 Pain in thoracic spine: Secondary | ICD-10-CM | POA: Diagnosis not present

## 2017-12-14 DIAGNOSIS — M25552 Pain in left hip: Secondary | ICD-10-CM | POA: Diagnosis not present

## 2017-12-14 DIAGNOSIS — S39012A Strain of muscle, fascia and tendon of lower back, initial encounter: Secondary | ICD-10-CM | POA: Diagnosis not present

## 2017-12-14 DIAGNOSIS — M545 Low back pain: Secondary | ICD-10-CM | POA: Diagnosis not present

## 2017-12-14 DIAGNOSIS — S8992XA Unspecified injury of left lower leg, initial encounter: Secondary | ICD-10-CM | POA: Diagnosis not present

## 2017-12-14 DIAGNOSIS — K5730 Diverticulosis of large intestine without perforation or abscess without bleeding: Secondary | ICD-10-CM

## 2017-12-14 DIAGNOSIS — Z87891 Personal history of nicotine dependence: Secondary | ICD-10-CM | POA: Diagnosis not present

## 2017-12-14 DIAGNOSIS — S3993XA Unspecified injury of pelvis, initial encounter: Secondary | ICD-10-CM | POA: Diagnosis not present

## 2017-12-14 DIAGNOSIS — S3992XA Unspecified injury of lower back, initial encounter: Secondary | ICD-10-CM | POA: Diagnosis not present

## 2017-12-14 DIAGNOSIS — M25562 Pain in left knee: Secondary | ICD-10-CM | POA: Diagnosis not present

## 2017-12-14 DIAGNOSIS — R52 Pain, unspecified: Secondary | ICD-10-CM | POA: Diagnosis not present

## 2017-12-14 DIAGNOSIS — R102 Pelvic and perineal pain: Secondary | ICD-10-CM | POA: Diagnosis not present

## 2017-12-14 DIAGNOSIS — W19XXXA Unspecified fall, initial encounter: Secondary | ICD-10-CM

## 2017-12-14 DIAGNOSIS — S79912A Unspecified injury of left hip, initial encounter: Secondary | ICD-10-CM | POA: Diagnosis not present

## 2017-12-14 LAB — CBC WITH DIFFERENTIAL/PLATELET
Basophils Absolute: 0 10*3/uL (ref 0.0–0.1)
Basophils Relative: 0 %
EOS PCT: 1 %
Eosinophils Absolute: 0.1 10*3/uL (ref 0.0–0.7)
HEMATOCRIT: 40.3 % (ref 36.0–46.0)
HEMOGLOBIN: 13 g/dL (ref 12.0–15.0)
LYMPHS ABS: 2.4 10*3/uL (ref 0.7–4.0)
LYMPHS PCT: 34 %
MCH: 28.2 pg (ref 26.0–34.0)
MCHC: 32.3 g/dL (ref 30.0–36.0)
MCV: 87.4 fL (ref 78.0–100.0)
Monocytes Absolute: 0.5 10*3/uL (ref 0.1–1.0)
Monocytes Relative: 7 %
NEUTROS ABS: 4.1 10*3/uL (ref 1.7–7.7)
Neutrophils Relative %: 58 %
PLATELETS: 228 10*3/uL (ref 150–400)
RBC: 4.61 MIL/uL (ref 3.87–5.11)
RDW: 14.5 % (ref 11.5–15.5)
WBC: 7 10*3/uL (ref 4.0–10.5)

## 2017-12-14 LAB — I-STAT CHEM 8, ED
BUN: 15 mg/dL (ref 6–20)
CREATININE: 0.6 mg/dL (ref 0.44–1.00)
Calcium, Ion: 1.11 mmol/L — ABNORMAL LOW (ref 1.15–1.40)
Chloride: 106 mmol/L (ref 101–111)
GLUCOSE: 97 mg/dL (ref 65–99)
HEMATOCRIT: 39 % (ref 36.0–46.0)
HEMOGLOBIN: 13.3 g/dL (ref 12.0–15.0)
POTASSIUM: 3.5 mmol/L (ref 3.5–5.1)
Sodium: 140 mmol/L (ref 135–145)
TCO2: 19 mmol/L — ABNORMAL LOW (ref 22–32)

## 2017-12-14 MED ORDER — FENTANYL CITRATE (PF) 100 MCG/2ML IJ SOLN
50.0000 ug | Freq: Once | INTRAMUSCULAR | Status: AC
  Administered 2017-12-14: 50 ug via INTRAVENOUS
  Filled 2017-12-14: qty 2

## 2017-12-14 MED ORDER — HYDROCODONE-ACETAMINOPHEN 5-325 MG PO TABS
1.0000 | ORAL_TABLET | Freq: Once | ORAL | Status: AC
Start: 2017-12-14 — End: 2017-12-14
  Administered 2017-12-14: 1 via ORAL
  Filled 2017-12-14: qty 1

## 2017-12-14 NOTE — ED Notes (Signed)
Patient transported to X-ray 

## 2017-12-14 NOTE — ED Provider Notes (Signed)
Patient handed off to me by previous EDP at shift change pending CT scans. Please see previous note for full details. Briefly, patient is a 77 year old female who presents with left-sided low back and hip pain after mechanical fall. Plan is to await CT scan results, if negative attempt ambulation prior to discharge home with pain control. Consider admission for pain control if unable to ambulate. Physical Exam  BP 135/78   Pulse 69   Temp 98.5 F (36.9 C) (Oral)   Resp 18   Ht 4\' 10"  (1.473 m)   Wt 65.8 kg (145 lb)   SpO2 95%   BMI 30.31 kg/m   Physical Exam  ED Course/Procedures   Clinical Course as of Dec 16 11  Fri Dec 14, 2017  2336 IMPRESSION: 1. No pelvic or left hip fracture. 2. Nontraumatic finding of sigmoid diverticulosis. Circumferential wall thickening in the mid sigmoid in the midst of multiple diverticula with mild adjacent soft tissue stranding, suspicious for acute diverticulitis. This is contiguous with rounded soft tissue density extending lateral to the sigmoid colon containing a small focus of air. This may be a large diverticulum. The appearance can be seen with underlying colonic mass. There is possible continuity with the left adnexa, and focal ovarian mass and potential colonic fistula is also considered, however felt less. Recommend correlation with any symptoms prior to fall as well as further workup. Direct visualization with colonoscopy to exclude underlying mass lesion is recommended. In the absence of colonic mass or large diverticulum, pelvic MRI may be helpful for evaluation of the adnexal structures.  [CG]    Clinical Course User Index [CG] Kinnie Feil, PA-C    Procedures  MDM   2355: Reviewed ED workup. CT scans do not show acute bony injury. CT of pelvis does show possible acute diverticulitis near area of colonic density/mass.  I discussed this finding with patient and family, provided them a copy of results. On exam, pt abdomen is soft,  NTND. No abdominal pain. She has h/o IBS-D with chronic diarrhea and hematochezia since November 2018 and has upcoming colonoscopy this Wednesday. Has a positive Cologuard. She is followed by Dr. Fuller Plan from [facility] for this. Per his last note, he has suspicion for colon malignancy. I spoke to on call [facility] MD Carlean Purl who recommended no antibiotics at this time as there is no abdominal tenderness, wall thickening and stranding likely from near by colonic mass. Pt ambulated in ED without assistance and minimal pain. Will d/c with pain control. She is to call [facility] on call provider if she develops diverticulitis symptoms. Pt agreeable. Aware of importance to f/u with colonoscopy to r/o cancer.      Kinnie Feil, PA-C 12/15/17 0014    Davonna Belling, MD 12/15/17 [phone]

## 2017-12-14 NOTE — ED Provider Notes (Addendum)
[facility] EMERGENCY DEPARTMENT Provider Note   CSN: [id number] Arrival date & time: 12/14/17  2011     History   Chief Complaint Chief Complaint  Patient presents with  . Fall    HPI Angel Holland is a 77 y.o. female.  HPI Patient presents after a fall.  She tripped over her husband's oxygen tubing fell.  Landed on her left hip and left knee.  Complaining of pain in her hip knee and lower back.  Pain was severe.  Improved somewhat after EMS fentanyl.  Does have chronic low back pain.  No numbness or weakness.  Pain worse with movement.  No loss of bladder or bowel control.  No head neck or upper extremity pain.  She is not on anticoagulation. Past Medical History:  Diagnosis Date  . Anxiety    no meds  . Arthritis    back and fingers   . Bruxism   . Cataract    baby cataracts per pt.  . Colon polyps   . DDD (degenerative disc disease), lumbar   . DDD (degenerative disc disease), lumbosacral   . Depression    no meds  . Diverticulosis of colon (without mention of hemorrhage)   . Fibroid uterus   . Headache, paroxysmal hemicrania, episodic 03/24/2014  . Hemorrhoids   . History of D&C    x2  . History of night sweats   . History of rectal bleeding    08/2017  . Hx of diverticulitis of colon 03/30/2014  . Hyperglycemia   . Irritable bowel syndrome   . Pulmonary nodules - reports followed by pulmonology, reports following up with them in June 2015 03/26/2013   Repeat CT chest 04/20/14 > 1. Scattered pulmonary nodules measure 4 mm or less in size, are unchanged from 03/28/2013 and are therefore considered benign    . Tobacco abuse   . Unspecified hemorrhoids without mention of complication     Patient Active Problem List   Diagnosis Date Noted  . Aortic atherosclerosis ([place]) 07/17/2017  . Hyperglycemia 07/12/2015  . Mass of foot 07/12/2015  . History of IBS - followed by [facility] 03/30/2014    Past Surgical History:  Procedure Laterality Date    . APPENDECTOMY  1957  . bce    . CHOLECYSTECTOMY  2009  . DILATION AND CURETTAGE OF UTERUS    . fibroid removed from uterus    . HYSTEROSCOPY W/D&C N/A 09/09/2015   Procedure: DILATATION AND CURETTAGE /HYSTEROSCOPY;  Surgeon: Vanessa Kick, MD;  Location: [facility];  Service: Gynecology;  Laterality: N/A;    OB History    Gravida Para Term Preterm AB Living   2 2 2     2    SAB TAB Ectopic Multiple Live Births                   Home Medications    Prior to Admission medications   Medication Sig Start Date End Date Taking? Authorizing Provider  acetaminophen (TYLENOL 8 HOUR ARTHRITIS PAIN) 650 MG CR tablet Take 650 mg by mouth daily as needed for pain.    [provider]  aspirin 81 MG tablet Take 81 mg by mouth daily.      [provider]  cholecalciferol (VITAMIN D) 1000 UNITS tablet Take 1,000 Units by mouth daily.    [provider]  colestipol (COLESTID) 1 g tablet Take 1 tablet (1 g total) by mouth 2 (two) times daily. Patient not taking: Reported  on 12/05/2017 09/18/17   Ladene Artist, MD  cyanocobalamin 500 MCG tablet Take 500 mcg by mouth daily.    [provider]  dicyclomine (BENTYL) 10 MG capsule TAKE 1 CAPSULE BY MOUTH FOUR TIMES DAILY AS NEEDED FOR SPASMS 10/25/17   Ladene Artist, MD  Doxylamine Succinate, Sleep, (WAL-SOM PO) Take by mouth as needed.    [provider]  ibuprofen (ADVIL,MOTRIN) 200 MG tablet Take 200 mg by mouth every 6 (six) hours as needed for headache or mild pain.    [provider]  Loperamide HCl (ANTI-DIARRHEAL PO) Take by mouth. [facility] anti diarrheal meds 2 mg-Take prn    [provider]  metroNIDAZOLE (METROGEL) 0.75 % gel Apply 1 application topically at bedtime. Applies to face for rosacea    [provider]  mometasone (NASONEX) 50 MCG/ACT nasal spray Place 2 sprays into the nose daily as needed (for congestion).    [provider]  NON FORMULARY Take 1  tablet by mouth daily. OsteoMatrix    [provider]  Wheat Dextrin (BENEFIBER DRINK MIX PO) Take 1 each by mouth 2 (two) times daily.    [provider]    Family History Family History  Problem Relation Age of Onset  . Heart disease Mother        CHF  . Diverticulosis Mother   . Bone cancer Father        found in his mouth first  . Colon cancer Neg Hx     Social History Social History   Tobacco Use  . Smoking status: Former Smoker    Packs/day: 1.00    Years: 50.00    Pack years: 50.00    Types: Cigarettes    Last attempt to quit: 07/23/2008    Years since quitting: 9.4  . Smokeless tobacco: Never Used  Substance Use Topics  . Alcohol use: No  . Drug use: No     Allergies   Biaxin [clarithromycin] and Codeine   Review of Systems Review of Systems  Constitutional: Negative for appetite change and fever.  HENT: Negative for congestion.   Respiratory: Negative for shortness of breath.   Cardiovascular: Negative for chest pain.  Gastrointestinal: Negative for abdominal pain.  Musculoskeletal: Positive for back pain. Negative for neck pain and neck stiffness.       Left hip and left knee pain.  Skin: Negative for rash.  Neurological: Negative for syncope.  Psychiatric/Behavioral: Negative for behavioral problems.     Physical Exam Updated Vital Signs BP (!) 142/71   Pulse 70   Temp 98.5 F (36.9 C) (Oral)   Resp 13   Ht 4\' 10"  (1.473 m)   Wt 65.8 kg (145 lb)   SpO2 95%   BMI 30.31 kg/m   Physical Exam  Constitutional: She appears well-developed and well-nourished.  Patient is laying flat on her back in the bed.  Minimal movement.  HENT:  Head: Atraumatic.  Eyes: EOM are normal.  Neck: Neck supple.  Cardiovascular: Normal rate.  Pulmonary/Chest: Effort normal.  Abdominal: There is no tenderness.  Musculoskeletal: She exhibits tenderness.  Tender over lower thoracic and lumbar spine.  No step-off.  Tenderness over left posterior  pelvis also.  Some tenderness over the lateral left superior pelvis also.  No pain with rotation at the left hip but somewhat decreased range of motion due to pain.  Some tenderness laterally over the left knee also.  Neurovascular intact bilateral feet.  No tenderness over right  hip.  No upper extremity tenderness.  No cervical spine tenderness.  Neurological: She is alert.  Skin: Skin is warm. Capillary refill takes less than 2 seconds.     ED Treatments / Results  Labs (all labs ordered are listed, but only abnormal results are displayed) Labs Reviewed  I-STAT CHEM 8, ED - Abnormal; Notable for the following components:      Result Value   Calcium, Ion 1.11 (*)    TCO2 19 (*)    All other components within normal limits  CBC WITH DIFFERENTIAL/PLATELET    EKG  EKG Interpretation None       Radiology Dg Lumbar Spine Complete  Result Date: 12/14/2017 CLINICAL DATA:  Pain after fall EXAM: LUMBAR SPINE - COMPLETE 4+ VIEW COMPARISON:  July 02, 2014 FINDINGS: Mild scoliotic curvature of the lumbar spine, apex to the left. Grade 1 retrolisthesis of L3 versus L4 is stable. No other malalignment. No fracture identified in the lumbar spine. There is mixed sclerosis and lucency over the superior endplate of B71. Scattered degenerative changes most marked at L2-3 with anterior and posterior osteophytes at this level. No other acute abnormalities. IMPRESSION: 1. Mixed sclerosis and linear lucency at the superior endplate of I96 could represent degenerative change or a subtle fracture of the superior endplate. CT or MRI imaging could better evaluate this region if clinically warranted. No other abnormalities. Electronically Signed   By: Dorise Bullion III M.D   On: 12/14/2017 21:43   Dg Pelvis Portable  Result Date: 12/14/2017 CLINICAL DATA:  Fall.  Back pain.  Initial encounter. EXAM: PORTABLE PELVIS 1-2 VIEWS COMPARISON:  10/22/2012 FINDINGS: There is no evidence of acute pelvic fracture  or diastasis. Degenerative changes are again noted about the pubic symphysis. The soft tissues are grossly unremarkable. IMPRESSION: No acute osseous abnormality. Electronically Signed   By: Logan Bores M.D.   On: 12/14/2017 21:09   Dg Knee Complete 4 Views Left  Result Date: 12/14/2017 CLINICAL DATA:  Pain after fall EXAM: LEFT KNEE - COMPLETE 4+ VIEW COMPARISON:  None. FINDINGS: No fracture or dislocation. No joint effusion. Sclerosis in the distal femoral diaphysis is consistent with a calcifying enchondroma or less likely an old infarct. No acute abnormalities identified. IMPRESSION: Negative. Electronically Signed   By: Dorise Bullion III M.D   On: 12/14/2017 21:45   Dg Hip Unilat W Or Wo Pelvis 2-3 Views Left  Result Date: 12/14/2017 CLINICAL DATA:  Pain after fall EXAM: DG HIP (WITH OR WITHOUT PELVIS) 2-3V LEFT COMPARISON:  None. FINDINGS: A skin fold overlying the femoral neck partially limits evaluation. However, within this limitation, there is no evidence of fracture identified. No dislocation. No other abnormalities. IMPRESSION: Negative. Electronically Signed   By: Dorise Bullion III M.D   On: 12/14/2017 21:46    Procedures Procedures (including critical care time)  Medications Ordered in ED Medications  HYDROcodone-acetaminophen (NORCO/VICODIN) 5-325 MG per tablet 1 tablet (not administered)  fentaNYL (SUBLIMAZE) injection 50 mcg (50 mcg Intravenous Given 12/14/17 2044)     Initial Impression / Assessment and Plan / ED Course  I have reviewed the triage vital signs and the nursing notes.  Pertinent labs & imaging results that were available during my care of the patient were reviewed by me and considered in my medical decision making (see chart for details).  Clinical Course as of Dec 15 2154  Fri Dec 14, 2017  2336 IMPRESSION: 1. No pelvic or left hip fracture. 2. Nontraumatic finding of sigmoid  diverticulosis. Circumferential wall thickening in the mid sigmoid in the  midst of multiple diverticula with mild adjacent soft tissue stranding, suspicious for acute diverticulitis. This is contiguous with rounded soft tissue density extending lateral to the sigmoid colon containing a small focus of air. This may be a large diverticulum. The appearance can be seen with underlying colonic mass. There is possible continuity with the left adnexa, and focal ovarian mass and potential colonic fistula is also considered, however felt less. Recommend correlation with any symptoms prior to fall as well as further workup. Direct visualization with colonoscopy to exclude underlying mass lesion is recommended. In the absence of colonic mass or large diverticulum, pelvic MRI may be helpful for evaluation of the adnexal structures.  [CG]    Clinical Course User Index [CG] Kinnie Feil, PA-C    Patient with fall.  Left pelvis and back pain.  Tender over lumbar and lower thoracic spine.  X-ray showed possible T12 compression fracture.  Will get CT scans for better evaluation.  After further imaging may attempt ambulation with her pain control.  Care will be turned over to oncoming physician.  Final Clinical Impressions(s) / ED Diagnoses   Final diagnoses:  Fall, initial encounter  Lumbar strain, initial encounter  Closed compression fracture of thoracic vertebra, initial encounter [facility])    ED Discharge Orders    None       Davonna Belling, MD 12/14/17 2225    Davonna Belling, MD 12/15/17 2157

## 2017-12-14 NOTE — ED Notes (Signed)
Portable xray at bedside.

## 2017-12-14 NOTE — ED Triage Notes (Signed)
Per pt she was at home and tripped over her husbands O2 wiring and fell and hit her left hip left knee. No LOC, did not hit her head. No shortening of left leg. 10/10 pain when ems arrived. Given 50 of fentanyl from ems. 149/119, 95% ra, 70 hr

## 2017-12-15 MED ORDER — HYDROCODONE-ACETAMINOPHEN 5-325 MG PO TABS: 2.0000 | ORAL_TABLET | ORAL | 0 refills | Status: DC | PRN

## 2017-12-15 NOTE — Discharge Instructions (Signed)
Take norco for pain as prescribed. Avoid ibuprofen or aspirin. For additional pain control you can take 500 mg tylenol every 6-8 hours.   We discussed your CT scans today. There is a density or mass like lesion in your colon. This need further work up. Go to your colonoscopy appointment next week as scheduled. If you develop worsening diarrhea, abdominal pain, fevers, chills, nausea, vomiting call your gastroenterologist or return to the emergency department. Return to the emergency department if you have groin numbness, extremity weakness or numbness.

## 2017-12-15 NOTE — ED Notes (Signed)
Pt ambulated to the bathroom with not issues or problems. Pt was able to do same on her own.

## 2017-12-19 ENCOUNTER — Telehealth: Payer: Self-pay | Admitting: Gastroenterology

## 2017-12-19 ENCOUNTER — Other Ambulatory Visit: Payer: Self-pay

## 2017-12-19 ENCOUNTER — Ambulatory Visit (AMBULATORY_SURGERY_CENTER): Payer: Medicare Other | Admitting: Gastroenterology

## 2017-12-19 ENCOUNTER — Encounter: Payer: Self-pay | Admitting: Gastroenterology

## 2017-12-19 VITALS — BP 79/41 | HR 77 | Temp 97.1°F | Resp 13 | Ht <= 58 in | Wt 145.0 lb

## 2017-12-19 DIAGNOSIS — K921 Melena: Secondary | ICD-10-CM | POA: Diagnosis not present

## 2017-12-19 DIAGNOSIS — R197 Diarrhea, unspecified: Secondary | ICD-10-CM | POA: Diagnosis not present

## 2017-12-19 DIAGNOSIS — R195 Other fecal abnormalities: Secondary | ICD-10-CM

## 2017-12-19 DIAGNOSIS — Z86010 Personal history of colonic polyps: Secondary | ICD-10-CM | POA: Diagnosis not present

## 2017-12-19 DIAGNOSIS — R933 Abnormal findings on diagnostic imaging of other parts of digestive tract: Secondary | ICD-10-CM

## 2017-12-19 MED ORDER — SODIUM CHLORIDE 0.9 % IV SOLN: 500.0000 mL | Freq: Once | INTRAVENOUS | Status: DC

## 2017-12-19 NOTE — Progress Notes (Signed)
Called to room to assist during endoscopic procedure.  Patient ID and intended procedure confirmed with present staff. Received instructions for my participation in the procedure from the performing physician.  

## 2017-12-19 NOTE — Progress Notes (Signed)
Patient mentioned that she had a CT scan of her pelvis on Friday 12/14/17 after she fell at home. The CT shows some thickening in the sigmoid colon and possible underlying mass. Assured patient that  Dr. Fuller Plan will be made aware of these findings. SM

## 2017-12-19 NOTE — Telephone Encounter (Signed)
Patient called early this morning complaining of multiple things. States that she was going to the bathroom multiple times after taking her prep. She was fearful that she was going to have incontinence on the way to the facility for her procedure. She Wondered if she could start her procedure earlier.She felt and sounded very anxious. She states that she felt her heart was going to "explode". However, no chest pain, shortness of breath, or tachycardia. She tells me that she had a positive cologuard and a recent x-ray that showed she had a colon mass. She is very anxious.I advised that she could take her second portion of the prep earlier. I also advised that should she develop chest pain, shortness of breath, or persistent tachycardia to stop her prep and proceed to the emergency room. She agreed

## 2017-12-19 NOTE — Patient Instructions (Signed)
YOU HAD AN ENDOSCOPIC PROCEDURE TODAY AT THE [facility]:   Refer to the procedure report that was given to you for any specific questions about what was found during the examination.  If the procedure report does not answer your questions, please call your gastroenterologist to clarify.  If you requested that your care partner not be given the details of your procedure findings, then the procedure report has been included in a sealed envelope for you to review at your convenience later.  YOU SHOULD EXPECT: Some feelings of bloating in the abdomen. Passage of more gas than usual.  Walking can help get rid of the air that was put into your GI tract during the procedure and reduce the bloating. If you had a lower endoscopy (such as a colonoscopy or flexible sigmoidoscopy) you may notice spotting of blood in your stool or on the toilet paper. If you underwent a bowel prep for your procedure, you may not have a normal bowel movement for a few days.  Please Note:  You might notice some irritation and congestion in your nose or some drainage.  This is from the oxygen used during your procedure.  There is no need for concern and it should clear up in a day or so.  SYMPTOMS TO REPORT IMMEDIATELY:   Following lower endoscopy (colonoscopy or flexible sigmoidoscopy):  Excessive amounts of blood in the stool  Significant tenderness or worsening of abdominal pains  Swelling of the abdomen that is new, acute  Fever of 100F or higher   For urgent or emergent issues, a gastroenterologist can be reached at any hour by calling [phone].   DIET:  We do recommend a small meal at first, but then you may proceed to your regular diet.  Follow a High Fiber Diet (see handout given to you by your recovery nurse). Drink plenty of fluids but you should avoid alcoholic beverages for 24 hours.  MEDICATIONS: Continue present medications.   Please see handouts given to you by your recovery  nurse.  ACTIVITY:  You should plan to take it easy for the rest of today and you should NOT DRIVE or use heavy machinery until tomorrow (because of the sedation medicines used during the test).    FOLLOW UP: Our staff will call the number listed on your records the next business day following your procedure to check on you and address any questions or concerns that you may have regarding the information given to you following your procedure. If we do not reach you, we will leave a message.  However, if you are feeling well and you are not experiencing any problems, there is no need to return our call.  We will assume that you have returned to your regular daily activities without incident.  If any biopsies were taken you will be contacted by phone or by letter within the next 1-3 weeks.  Please call us at [phone] if you have not heard about the biopsies in 3 weeks.   Thank you for allowing us to provide for your healthcare needs today.  SIGNATURES/CONFIDENTIALITY: You and/or your care partner have signed paperwork which will be entered into your electronic medical record.  These signatures attest to the fact that that the information above on your After Visit Summary has been reviewed and is understood.  Full responsibility of the confidentiality of this discharge information lies with you and/or your care-partner. 

## 2017-12-19 NOTE — Op Note (Signed)
[facility] Patient Name: Angel Holland Procedure Date: 12/19/2017 10:21 AM MRN: [id number] Endoscopist: Ladene Artist , MD Age: 77 Referring MD:  Date of Birth: December 12, 1940 Gender: Female Account #: [id number] Procedure:                Colonoscopy Indications:              Clinically significant diarrhea of unexplained                            origin, Hematochezia, Abnormal CT of the GI tract                            (sigmoid colon), Positive Cologuard test Medicines:                Monitored Anesthesia Care Procedure:                Pre-Anesthesia Assessment:                           - Prior to the procedure, a History and Physical                            was performed, and patient medications and                            allergies were reviewed. The patient's tolerance of                            previous anesthesia was also reviewed. The risks                            and benefits of the procedure and the sedation                            options and risks were discussed with the patient.                            All questions were answered, and informed consent                            was obtained. Prior Anticoagulants: The patient has                            taken no previous anticoagulant or antiplatelet                            agents. ASA Grade Assessment: II - A patient with                            mild systemic disease. After reviewing the risks                            and benefits, the patient was deemed in  satisfactory condition to undergo the procedure.                           After obtaining informed consent, the colonoscope                            was passed under direct vision. Throughout the                            procedure, the patient's blood pressure, pulse, and                            oxygen saturations were monitored continuously. The                            Model PCF-[id number]  [id number]) scope was introduced                            through the anus and advanced to the the cecum,                            identified by appendiceal orifice and ileocecal                            valve. The ileocecal valve, appendiceal orifice,                            and rectum were photographed. The quality of the                            bowel preparation was adequate. The colonoscopy was                            performed without difficulty. The patient tolerated                            the procedure well. Scope In: 10:21:51 AM Scope Out: 10:36:20 AM Scope Withdrawal Time: 0 hours 11 minutes 58 seconds  Total Procedure Duration: 0 hours 14 minutes 29 seconds  Findings:                 The perianal and digital rectal examinations were                            normal.                           Multiple medium-mouthed diverticula were found in                            the left colon. There was narrowing of the colon in                            association with the diverticular opening. There  was evidence of diverticular spasm.                            Peri-diverticular erythema was seen. There was no                            evidence of diverticular bleeding.                           Internal hemorrhoids were found during                            retroflexion. The hemorrhoids were small and Grade                            I (internal hemorrhoids that do not prolapse).                           The exam was otherwise without abnormality on                            direct and retroflexion views. Random biopsies                            taken throughout the colon. Complications:            No immediate complications. Estimated blood loss:                            None. Estimated Blood Loss:     Estimated blood loss: none. Impression:               - Severe diverticulosis in the left colon. There                             was narrowing of the colon in association with the                            diverticular opening. There was evidence of                            diverticular spasm. Peri-diverticular erythema was                            seen. There was no evidence of diverticular                            bleeding.                           - Internal hemorrhoids.                           - The examination was otherwise normal on direct  and retroflexion views. Random biopsies obtained. Recommendation:           - Patient has a contact number available for                            emergencies. The signs and symptoms of potential                            delayed complications were discussed with the                            patient. Return to normal activities tomorrow.                            Written discharge instructions were provided to the                            patient.                           - High fiber diet.                           - Continue present medications.                           - Await pathology results.                           - No repeat colonoscopy due to age. Ladene Artist, MD 12/19/2017 10:41:11 AM This report has been signed electronically.

## 2017-12-19 NOTE — Progress Notes (Signed)
Report given to PACU, vss 

## 2017-12-19 NOTE — Telephone Encounter (Signed)
Attempted to call patient to see if she was coming in for her procedure this a.m.  No answer and left message on VM.  Asked patient to call back in regards to her irregular heart rate and concerns about "having a heart attack".

## 2017-12-19 NOTE — Progress Notes (Signed)
Pt states that she felt like her heart was beating really quickly and then it "went boom" last night during her prep. She hasnt had any more episodes since then. VSS were stable this am and pulse was steady and regular. SM

## 2017-12-20 ENCOUNTER — Telehealth: Payer: Self-pay

## 2017-12-20 NOTE — Telephone Encounter (Signed)
  Follow up Call-  Call Angel Holland number 12/19/2017  Post procedure Call Angel Holland phone  # [phone]  Permission to leave phone message Yes  Some recent data might be hidden     Patient questions:  Do you have a fever, pain , or abdominal swelling? No. Pain Score  0 *  Have you tolerated food without any problems? Yes.    Have you been able to return to your normal activities? Yes.    Do you have any questions about your discharge instructions: Diet   No. Medications  No. Follow up visit  No.  Do you have questions or concerns about your Care? No.  Actions: * If pain score is 4 or above: No action needed, pain <4.

## 2017-12-29 ENCOUNTER — Encounter: Payer: Self-pay | Admitting: Gastroenterology

## 2018-01-18 ENCOUNTER — Encounter: Payer: Self-pay | Admitting: Cardiovascular Disease

## 2018-01-18 ENCOUNTER — Ambulatory Visit (INDEPENDENT_AMBULATORY_CARE_PROVIDER_SITE_OTHER): Payer: Medicare Other | Admitting: Cardiovascular Disease

## 2018-01-18 VITALS — BP 136/74 | HR 70 | Ht <= 58 in | Wt 143.0 lb

## 2018-01-18 DIAGNOSIS — R002 Palpitations: Secondary | ICD-10-CM | POA: Diagnosis not present

## 2018-01-18 NOTE — Patient Instructions (Signed)
Medication Instructions: Your physician recommends that you continue on your current medications as directed. Please refer to the Current Medication list given to you today.  Testing/Procedures: Your physician has requested that you have an echocardiogram. Echocardiography is a painless test that uses sound waves to create images of your heart. It provides your doctor with information about the size and shape of your heart and how well your heart's chambers and valves are working. This procedure takes approximately one hour. There are no restrictions for this procedure.  Your physician has recommended that you wear a 14 day event monitor. Event monitors are medical devices that record the heart's electrical activity. Doctors most often [place] these monitors to diagnose arrhythmias. Arrhythmias are problems with the speed or rhythm of the heartbeat. The monitor is a small, portable device. You can wear one while you do your normal daily activities. This is usually used to diagnose what is causing palpitations/syncope (passing out).  Follow-Up: Your physician wants you to follow-up in: 6 months with Dr. Gwenlyn Found. You will receive a reminder letter in the mail two months in advance. If you don't receive a letter, please call our office to schedule the follow-up appointment.  If you need a refill on your cardiac medications before your next appointment, please call your pharmacy.

## 2018-01-18 NOTE — Progress Notes (Signed)
01/18/2018 KADEN DUNKEL   11-May-1941  [id number]  Primary Physician Lucretia Kern, DO Primary Cardiologist: Lorretta Harp MD [facility], [place], [place]  HPI:  Angel Holland is a 77 y.o. mildly overweight married Caucasian female mother of 2 sons, grandmother 4 grandchildren referred by Dr. Colin Benton for cardiac evaluation because of new onset palpitations. Risk factors include 50-pack-years of tobacco abuse having quit 10 years ago. 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 She's never had a heart attack or stroke. She had colonoscopy a month ago had an episode chest pain at that time. Since then she's noticed nocturnal palpitations.    Current Meds  Medication Sig  . acetaminophen (TYLENOL 8 HOUR ARTHRITIS PAIN) 650 MG CR tablet Take 650 mg by mouth at bedtime.   Marland Kitchen aspirin 81 MG tablet Take 81 mg by mouth daily.    . cholecalciferol (VITAMIN D) 1000 UNITS tablet Take 1,000 Units by mouth daily.  . cyanocobalamin 500 MCG tablet Take 500 mcg by mouth daily.  Marland Kitchen dicyclomine (BENTYL) 10 MG capsule TAKE 1 CAPSULE BY MOUTH FOUR TIMES DAILY AS NEEDED FOR SPASMS  . Doxylamine Succinate, Sleep, (WAL-SOM PO) Take 1 tablet by mouth daily as needed (sleep).   Marland Kitchen HYDROcodone-acetaminophen (NORCO/VICODIN) 5-325 MG tablet Take 2 tablets by mouth every 4 (four) hours as needed.  Marland Kitchen ibuprofen (ADVIL,MOTRIN) 200 MG tablet Take 200 mg by mouth every 6 (six) hours as needed for headache or mild pain.  Marland Kitchen loperamide (IMODIUM) 2 MG capsule Take 2 mg by mouth 3 (three) times a week.  . metroNIDAZOLE (METROGEL) 0.75 % gel Apply 1 application topically at bedtime. Applies to face for rosacea  . mometasone (NASONEX) 50 MCG/ACT nasal spray Place 2 sprays into the nose daily as needed (for congestion).  . NON FORMULARY Take 1 tablet by mouth daily. OsteoMatrix  . Wheat Dextrin (BENEFIBER DRINK MIX PO) Take 1 each by mouth 2 (two) times daily.   Current Facility-Administered Medications for  the 01/18/18 encounter (Office Visit) with Lorretta Harp, MD  Medication  . 0.9 %  sodium chloride infusion     Allergies  Allergen Reactions  . Biaxin [Clarithromycin] Other (See Comments)    BREASTS INFLAMMED  . Codeine Other (See Comments)    PANIC ATTACKS, CAN'T SLEEP    Social History   Socioeconomic History  . Marital status: Married    Spouse name: Not on file  . Number of children: 2  . Years of education: Not on file  . Highest education level: Not on file  Occupational History  . Occupation: retired    Comment: Press photographer  Social Needs  . Financial resource strain: Not on file  . Food insecurity:    Worry: Not on file    Inability: Not on file  . Transportation needs:    Medical: Not on file    Non-medical: Not on file  Tobacco Use  . Smoking status: Former Smoker    Packs/day: 1.00    Years: 50.00    Pack years: 50.00    Types: Cigarettes    Last attempt to quit: 07/23/2008    Years since quitting: 9.4  . Smokeless tobacco: Never Used  Substance and Sexual Activity  . Alcohol use: No  . Drug use: No  . Sexual activity: Not Currently  Lifestyle  . Physical activity:    Days per week: Not on file    Minutes per session: Not on file  .  Stress: Not on file  Relationships  . Social connections:    Talks on phone: Not on file    Gets together: Not on file    Attends religious service: Not on file    Active member of club or organization: Not on file    Attends meetings of clubs or organizations: Not on file    Relationship status: Not on file  . Intimate partner violence:    Fear of current or ex partner: Not on file    Emotionally abused: Not on file    Physically abused: Not on file    Forced sexual activity: Not on file  Other Topics Concern  . 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  Cardiovascular: negative for chest pain, dyspnea on exertion, edema, orthopnea, palpitations, paroxysmal nocturnal dyspnea or shortness of breath Dermatological: negative for rash Respiratory: negative for cough or wheezing Urologic: negative for hematuria Abdominal: negative for nausea, vomiting, diarrhea, bright red blood per rectum, melena, or hematemesis Neurologic: negative for visual changes, syncope, or dizziness All other systems reviewed and are otherwise negative except as noted above.    Blood pressure 136/74, pulse 70, height 4\' 10"  (1.473 m), weight 143 lb (64.9 kg).  General appearance: alert and no distress Neck: no adenopathy, no carotid bruit, no JVD, supple, symmetrical, trachea midline and thyroid not enlarged, symmetric, no tenderness/mass/nodules Lungs: clear to auscultation bilaterally Heart: regular rate and rhythm, S1, S2 normal, no murmur, click, rub or gallop Extremities: extremities normal, atraumatic, no cyanosis or edema Pulses: 2+ and symmetric Skin: Skin color, texture, turgor normal. No rashes or lesions Neurologic: Alert and oriented X 3, normal strength and tone. Normal symmetric reflexes. Normal coordination and gait  EKG sinus rhythm at 70 with nonspecific ST and T-wave changes. I personally reviewed this EKG  ASSESSMENT AND PLAN:   Palpitations Angel Holland had a colonoscopy last month and since that time has noticed fairly frequent nocturnal palpitations. She also had an episode of chest pain in the peri-colonoscopy time period. M gutting a 2-D echocardiogram and a 2 week event monitor to further evaluate. She did have mild coronary calcification on chest CT as well.      Lorretta Harp MD [place], [place] 01/18/2018 10:18 AM    01/18/2018 Angel Holland   24-May-1941    [id number]  Primary Physician Lucretia Kern, DO Primary Cardiologist: Lorretta Harp MD [facility], [place], [place]  HPI:  Angel Holland is a 77 y.o. moderately overweight married Caucasian female mother of 2 sons, grandmother and 4 grandchildren referred by Dr. Colin Benton for cardiovascular evaluation because of palpitations. 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 Her factors include 50-pack-years of tobacco abuse having quit 10 years ago but otherwise is negative. She's never had a heart attack or stroke. She had a colonoscopy last month and had an episode of chest pain at that time. Since then she's noticed nocturnal palpitations..   Current Meds  Medication Sig  . acetaminophen (TYLENOL 8 HOUR ARTHRITIS PAIN)  650 MG CR tablet Take 650 mg by mouth at bedtime.   Marland Kitchen aspirin 81 MG tablet Take 81 mg by mouth daily.    . cholecalciferol (VITAMIN D) 1000 UNITS tablet Take 1,000 Units by mouth daily.  . cyanocobalamin 500 MCG tablet Take 500 mcg by mouth daily.  Marland Kitchen dicyclomine (BENTYL) 10 MG capsule TAKE 1 CAPSULE BY MOUTH FOUR TIMES DAILY AS NEEDED FOR SPASMS  . Doxylamine Succinate, Sleep, (WAL-SOM PO) Take 1 tablet by mouth daily as needed (sleep).   Marland Kitchen HYDROcodone-acetaminophen (NORCO/VICODIN) 5-325 MG tablet Take 2 tablets by mouth every 4 (four) hours as needed.  Marland Kitchen ibuprofen (ADVIL,MOTRIN) 200 MG tablet Take 200 mg by mouth every 6 (six) hours as needed for headache or mild pain.  Marland Kitchen loperamide (IMODIUM) 2 MG capsule Take 2 mg by mouth 3 (three) times a week.  . metroNIDAZOLE (METROGEL) 0.75 % gel Apply 1 application topically at bedtime. Applies to face for rosacea  . mometasone (NASONEX) 50 MCG/ACT nasal spray Place 2 sprays into the nose daily as needed (for congestion).  . NON FORMULARY Take 1 tablet by mouth daily. OsteoMatrix  . Wheat Dextrin (BENEFIBER DRINK MIX PO) Take 1 each by mouth 2 (two) times daily.   Current Facility-Administered Medications for the  01/18/18 encounter (Office Visit) with Lorretta Harp, MD  Medication  . 0.9 %  sodium chloride infusion     Allergies  Allergen Reactions  . Biaxin [Clarithromycin] Other (See Comments)    BREASTS INFLAMMED  . Codeine Other (See Comments)    PANIC ATTACKS, CAN'T SLEEP    Social History   Socioeconomic History  . Marital status: Married    Spouse name: Not on file  . Number of children: 2  . Years of education: Not on file  . Highest education level: Not on file  Occupational History  . Occupation: retired    Comment: Press photographer  Social Needs  . Financial resource strain: Not on file  . Food insecurity:    Worry: Not on file    Inability: Not on file  . Transportation needs:    Medical: Not on file    Non-medical: Not on file  Tobacco Use  . Smoking status: Former Smoker    Packs/day: 1.00    Years: 50.00    Pack years: 50.00    Types: Cigarettes    Last attempt to quit: 07/23/2008    Years since quitting: 9.4  . Smokeless tobacco: Never Used  Substance and Sexual Activity  . Alcohol use: No  . Drug use: No  . Sexual activity: Not Currently  Lifestyle  . Physical activity:    Days per week: Not on file    Minutes per session: Not on file  . Stress: Not on file  Relationships  . Social connections:    Talks on phone: Not on file    Gets together: Not on file    Attends religious service: Not on file    Active member of club or organization: Not on file    Attends meetings of clubs or organizations: Not on file    Relationship status: Not on file  . Intimate partner violence:    Fear of current or ex partner: Not on file    Emotionally abused: Not on file    Physically abused: Not on file    Forced sexual activity: Not on file  Other Topics Concern  . 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  Cardiovascular: negative for chest pain, dyspnea on exertion, edema, orthopnea, palpitations, paroxysmal nocturnal dyspnea or shortness of breath Dermatological: negative for rash Respiratory: negative for cough or wheezing Urologic: negative for hematuria Abdominal: negative for nausea, vomiting, diarrhea, bright red blood per rectum, melena, or hematemesis Neurologic: negative for visual changes, syncope, or dizziness All other systems reviewed and are otherwise negative except as noted above.    Blood pressure 136/74, pulse 70, height 4\' 10"  (1.473 m), weight 143 lb (64.9 kg).  General appearance: alert and no distress Neck: no adenopathy, no carotid bruit, no JVD, supple, symmetrical, trachea midline and thyroid not enlarged, symmetric, no tenderness/mass/nodules Lungs: clear to auscultation bilaterally Heart: regular rate and rhythm, S1, S2 normal, no murmur, click, rub or gallop Extremities: extremities normal, atraumatic, no cyanosis or edema Pulses: 2+ and symmetric Skin: Skin color, texture, turgor normal. No rashes or lesions Neurologic: Alert and oriented X 3, normal strength and tone. Normal symmetric reflexes. Normal coordination and gait  EKG sinus rhythm at 70 with nonspecific ST and T-wave changes. I personally reviewed this EKG  ASSESSMENT AND PLAN:   Palpitations Angel Holland had a colonoscopy last month and since that time has noticed fairly frequent nocturnal palpitations. She also had an episode of chest pain in the peri-colonoscopy time period. M gutting a 2-D echocardiogram and a 2 week event monitor to further evaluate. She did have mild coronary calcification on chest CT as well.      Lorretta Harp MD FACP,FACC,FAHA, [facility] 01/18/2018 10:18 AM

## 2018-01-18 NOTE — Assessment & Plan Note (Signed)
Angel Holland had a colonoscopy last month and since that time has noticed fairly frequent nocturnal palpitations. She also had an episode of chest pain in the peri-colonoscopy time period. M gutting a 2-D echocardiogram and a 2 week event monitor to further evaluate. She did have mild coronary calcification on chest CT as well.

## 2018-01-28 DIAGNOSIS — I480 Paroxysmal atrial fibrillation: Secondary | ICD-10-CM

## 2018-01-28 HISTORY — DX: Paroxysmal atrial fibrillation: I48.0

## 2018-02-05 ENCOUNTER — Ambulatory Visit (INDEPENDENT_AMBULATORY_CARE_PROVIDER_SITE_OTHER): Payer: Medicare Other

## 2018-02-05 ENCOUNTER — Encounter: Payer: Self-pay | Admitting: Cardiovascular Disease

## 2018-02-05 ENCOUNTER — Other Ambulatory Visit: Payer: Self-pay

## 2018-02-05 ENCOUNTER — Ambulatory Visit (HOSPITAL_COMMUNITY): Payer: Medicare Other | Attending: Cardiovascular Disease

## 2018-02-05 DIAGNOSIS — R002 Palpitations: Secondary | ICD-10-CM

## 2018-02-05 DIAGNOSIS — Z87891 Personal history of nicotine dependence: Secondary | ICD-10-CM | POA: Insufficient documentation

## 2018-02-05 DIAGNOSIS — R079 Chest pain, unspecified: Secondary | ICD-10-CM | POA: Diagnosis not present

## 2018-02-14 ENCOUNTER — Telehealth: Payer: Self-pay | Admitting: *Deleted

## 2018-02-14 NOTE — Telephone Encounter (Signed)
Received notification from [place] patient had Afib episode at 1:08 am. Called and spoke with patient and she was having flutters/palpitations. She has had several episodes at night while wearing the monitor but last night the worst. Episode last night lasted about an hour. She did have rectal bleeding in the fall, none since then. Per patient her colonoscopy in February showed diverticulosis.  Today she is feeling fine. Discussed with Dr Claiborne Billings DOD and will await completed monitor for Dr Gwenlyn Found to address.  Will give strip to [facility] and forward to Dr Gwenlyn Found

## 2018-02-14 NOTE — Telephone Encounter (Signed)
Will have strips scanned into Epic and routed to Dr. Gwenlyn Found to review.

## 2018-02-15 NOTE — Telephone Encounter (Signed)
I was not able to review CV strips. Have her come in next week for a MLP to review and potentially start [place]

## 2018-02-15 NOTE — Telephone Encounter (Signed)
Message routed to scheduling to arrange appt.

## 2018-02-19 ENCOUNTER — Encounter: Payer: Self-pay | Admitting: Physician Assistant

## 2018-02-19 ENCOUNTER — Ambulatory Visit (INDEPENDENT_AMBULATORY_CARE_PROVIDER_SITE_OTHER): Payer: Medicare Other | Admitting: Physician Assistant

## 2018-02-19 VITALS — BP 132/70 | HR 75 | Ht <= 58 in | Wt 144.6 lb

## 2018-02-19 DIAGNOSIS — I480 Paroxysmal atrial fibrillation: Secondary | ICD-10-CM

## 2018-02-19 DIAGNOSIS — R5383 Other fatigue: Secondary | ICD-10-CM | POA: Diagnosis not present

## 2018-02-19 MED ORDER — DILTIAZEM HCL ER COATED BEADS 120 MG PO CP24: 120.0000 mg | ORAL_CAPSULE | Freq: Every day | ORAL | 3 refills | Status: DC

## 2018-02-19 NOTE — Progress Notes (Signed)
Cardiology Office Note    Date:  02/19/2018   ID:  Angel Holland, DOB Jan 30, 1941, MRN [id number]  PCP:  Lucretia Kern, DO  Cardiologist:  Dr. Gwenlyn Found  Chief Complaint  Patient presents with  . Follow-up    followup on heart monitor, new afib    History of Present Illness:  Angel Holland is a 77 y.o. female with past medical history of irritable bowel syndrome, pulmonary nodule and a history of tobacco abuse who presented recently for evaluation of palpitation.  She quit smoking 10 years ago.  She never had any prior history of heart attack or stroke.  Dr. Gwenlyn Found recommended echocardiogram and heart monitor.  Echocardiogram obtained on 02/05/2018 showed EF 55-60%, grade 1 DD, PA peak pressure 38 mmHg.  30-day event monitor showed atrial fibrillation.  Talking to the patient today, she says the first time she had atrial fibrillation was after drinking the bowel prep for her colonoscopy.  She had another episode of atrial fibrillation later which she attributed to the high strength Tylenol (bottle reviewed, acetomenophen alone, not combination medication). I told her it is unlikely for tylenol to cause the palpitation. We discussed the pathophysiology behind atrial fibrillation today, we also discussed the need for rate control and also stroke prevention.  Review of her heart monitor shows her heart rate was actually self controlled given the atrial fibrillation, I hope with addition of low-dose diltiazem, we can suppress recurrence of atrial fibrillation even further.  I discussed the benefit and the risk of Coumadin, Xarelto and Eliquis with the patient, she is adamant that she does not want to consider any systemic anticoagulation at this time.  Apparently multiple members of her family had bleeding issues on different kind of blood thinner including the novel agent, her husband had a hemorrhagic stroke on Coumadin.  She is adamant she does not want to consider systemic anticoagulation and understand  that she would have higher risk of embolic stroke down the road.  I will check her TSH.  She will continue on low-dose aspirin.   Past Medical History:  Diagnosis Date  . Anxiety    no meds  . Arthritis    back and fingers   . Bruxism   . Cataract    baby cataracts per pt.  . Colon polyps   . DDD (degenerative disc disease), lumbar   . DDD (degenerative disc disease), lumbosacral   . Depression    no meds  . Diverticulosis of colon (without mention of hemorrhage)   . Fibroid uterus   . Headache, paroxysmal hemicrania, episodic 03/24/2014  . Hemorrhoids   . History of D&C    x2  . History of night sweats   . History of rectal bleeding    08/2017  . Hx of diverticulitis of colon 03/30/2014  . Hyperglycemia   . Irritable bowel syndrome   . Pulmonary nodules - reports followed by pulmonology, reports following up with them in June 2015 03/26/2013   Repeat CT chest 04/20/14 > 1. Scattered pulmonary nodules measure 4 mm or less in size, are unchanged from 03/28/2013 and are therefore considered benign    . Tobacco abuse   . Unspecified hemorrhoids without mention of complication     Past Surgical History:  Procedure Laterality Date  . APPENDECTOMY  1957  . bce    . CHOLECYSTECTOMY  2009  . DILATION AND CURETTAGE OF UTERUS    . fibroid removed from uterus    . HYSTEROSCOPY  W/D&C N/A 09/09/2015   Procedure: DILATATION AND CURETTAGE /HYSTEROSCOPY;  Surgeon: Vanessa Kick, MD;  Location: [facility];  Service: Gynecology;  Laterality: N/A;    Current Medications: Outpatient Medications Prior to Visit  Medication Sig Dispense Refill  . acetaminophen (TYLENOL 8 HOUR ARTHRITIS PAIN) 650 MG CR tablet Take 650 mg by mouth at bedtime.     Marland Kitchen aspirin 81 MG tablet Take 81 mg by mouth daily.      . cholecalciferol (VITAMIN D) 1000 UNITS tablet Take 1,000 Units by mouth daily.    . cyanocobalamin 500 MCG tablet Take 500 mcg by mouth daily.    Marland Kitchen dicyclomine (BENTYL) 10 MG capsule TAKE 1 CAPSULE BY  MOUTH FOUR TIMES DAILY AS NEEDED FOR SPASMS 120 capsule 0  . Doxylamine Succinate, Sleep, (WAL-SOM PO) Take 1 tablet by mouth daily as needed (sleep).     Marland Kitchen HYDROcodone-acetaminophen (NORCO/VICODIN) 5-325 MG tablet Take 2 tablets by mouth every 4 (four) hours as needed. 10 tablet 0  . ibuprofen (ADVIL,MOTRIN) 200 MG tablet Take 200 mg by mouth every 6 (six) hours as needed for headache or mild pain.    Marland Kitchen loperamide (IMODIUM) 2 MG capsule Take 2 mg by mouth 3 (three) times a week.    . metroNIDAZOLE (METROGEL) 0.75 % gel Apply 1 application topically at bedtime. Applies to face for rosacea    . mometasone (NASONEX) 50 MCG/ACT nasal spray Place 2 sprays into the nose daily as needed (for congestion).    . NON FORMULARY Take 1 tablet by mouth daily. OsteoMatrix    . VITAMIN E PO Take by mouth daily.    . Wheat Dextrin (BENEFIBER DRINK MIX PO) Take 1 each by mouth 2 (two) times daily.     Facility-Administered Medications Prior to Visit  Medication Dose Route Frequency Provider Last Rate Last Dose  . 0.9 %  sodium chloride infusion  500 mL Intravenous Once Ladene Artist, MD         Allergies:   Biaxin [clarithromycin] and Codeine   Social History   Socioeconomic History  . Marital status: Married    Spouse name: Not on file  . Number of children: 2  . Years of education: Not on file  . Highest education level: Not on file  Occupational History  . Occupation: retired    Comment: Press photographer  Social Needs  . Financial resource strain: Not on file  . Food insecurity:    Worry: Not on file    Inability: Not on file  . Transportation needs:    Medical: Not on file    Non-medical: Not on file  Tobacco Use  . Smoking status: Former Smoker    Packs/day: 1.00    Years: 50.00    Pack years: 50.00    Types: Cigarettes    Last attempt to quit: 07/23/2008    Years since quitting: 9.5  . Smokeless tobacco: Never Used  Substance and Sexual Activity  . Alcohol use: No  . Drug use: No  .  Sexual activity: Not Currently  Lifestyle  . Physical activity:    Days per week: Not on file    Minutes per session: Not on file  . Stress: Not on file  Relationships  . Social connections:    Talks on phone: Not on file    Gets together: Not on file    Attends religious service: Not on file    Active member of club or organization: Not on file  Attends meetings of clubs or organizations: Not on file    Relationship status: Not on file  Other Topics Concern  . 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   ROS:   Please see the history of present illness.    ROS All other systems reviewed and are negative.   PHYSICAL EXAM:   VS:  BP 132/70   Pulse 75   Ht 4\' 10"  (1.473 m)   Wt 144 lb 9.6 oz (65.6 kg)   BMI 30.22 kg/m    GEN: Well nourished, well developed, in no acute distress  HEENT: normal  Neck: no JVD, carotid bruits, or masses Cardiac: RRR; no murmurs, rubs, or gallops,no edema  Respiratory:  clear to auscultation bilaterally, normal work of breathing GI: soft, nontender, nondistended, + BS MS: no deformity or atrophy  Skin: warm and dry, no rash Neuro:  Alert and Oriented x 3, Strength and sensation are intact Psych: euthymic mood, full affect  Wt Readings from Last 3 Encounters:  02/19/18 144 lb 9.6 oz (65.6 kg)  01/18/18 143 lb (64.9 kg)  12/19/17 145 lb (65.8 kg)      Studies/Labs Reviewed:   EKG:  EKG is not ordered today.  Recent Labs: 12/14/2017: BUN 15; Creatinine, Ser 0.60; Hemoglobin 13.3; Platelets 228; Potassium 3.5; Sodium 140   Lipid Panel    Component Value Date/Time   CHOL  172 07/17/2017 0919   TRIG 112.0 07/17/2017 0919   HDL 48.10 07/17/2017 0919   CHOLHDL 4 07/17/2017 0919   VLDL 22.4 07/17/2017 0919   LDLCALC 102 (H) 07/17/2017 0919   LDLDIRECT 145.5 09/19/2007 0000    Additional studies/ records that were reviewed today include:   Echo 02/05/2018 LV EF: 55% -   60%  Study Conclusions  - Left ventricle: The cavity size was normal. Systolic function was   normal. The estimated ejection fraction was in the range of 55%   to 60%. Wall motion was normal; there were no regional wall   motion abnormalities. Doppler parameters are consistent with   abnormal left ventricular relaxation (grade 1 diastolic   dysfunction). - Pulmonary arteries: Systolic pressure was mildly increased. PA   peak pressure: 38 mm Hg (S).    ASSESSMENT:    1. PAF (paroxysmal atrial fibrillation) (HCC)   2. Other fatigue      PLAN:  In order of problems listed above:  1. PAF: Echocardiogram normal.  We had a long discussion today regarding pathophysiology and cardiac risk of atrial fibrillation.  I will start her on low-dose diltiazem CD 120 mg daily for suppression of atrial fibrillation.  We had a long discussion regarding benefit and risk of Coumadin, Xarelto and Eliquis, she does not wish to consider systemic anticoagulation and adamant that systemic anticoagulation is more harmful than beneficial as multiple members of her family had issues on anticoagulation.  Apparently, her husband had hemorrhagic stroke on Coumadin.  I tried to convince her that benefit of the systemic anticoagulation is  higher than the risk, however she understands the potential high risk of embolic stroke but still does not wish to do Xarelto, Coumadin or Eliquis.  I will continue her on 81 mg aspirin for now.  I will obtain TSH to check the thyroid.    Medication Adjustments/Labs and Tests Ordered: Current medicines are reviewed at length with the patient today.  Concerns regarding medicines  are outlined above.  Medication changes, Labs and Tests ordered today are listed in the Patient Instructions below. Patient Instructions  Medication Instructions:  START- Diltiazem CD 120 mg daily  If you need a refill on your cardiac medications before your next appointment, please call your pharmacy.  Labwork: TSH Today HERE IN OUR OFFICE AT [facility]  Take the provided lab slips with you to the lab for your blood draw.   You will NOT need to fast   Testing/Procedures: None Ordered  Follow-Up: Your physician wants you to follow-up in: 3 Months with Dr Gwenlyn Found.     Thank you for choosing [facility] at [facility], [place], [place]  02/19/2018 [facility] [place], [place], [place]  [address] Phone: [phone]; Fax: [phone]

## 2018-02-19 NOTE — Patient Instructions (Addendum)
Medication Instructions:  START- Diltiazem CD 120 mg daily  If you need a refill on your cardiac medications before your next appointment, please call your pharmacy.  Labwork: TSH Today HERE IN OUR OFFICE AT [facility]  Take the provided lab slips with you to the lab for your blood draw.   You will NOT need to fast   Testing/Procedures: None Ordered  Follow-Up: Your physician wants you to follow-up in: 3 Months with Dr Gwenlyn Found.     Thank you for choosing [facility] at [facility]!!

## 2018-02-20 LAB — TSH: TSH: 0.993 u[IU]/mL (ref 0.450–4.500)

## 2018-02-20 NOTE — Progress Notes (Signed)
Thyroid level stable

## 2018-02-22 DIAGNOSIS — Z1231 Encounter for screening mammogram for malignant neoplasm of breast: Secondary | ICD-10-CM | POA: Diagnosis not present

## 2018-02-22 LAB — HM MAMMOGRAPHY

## 2018-05-17 ENCOUNTER — Encounter: Payer: Self-pay | Admitting: Family Medicine

## 2018-05-28 ENCOUNTER — Encounter: Payer: Self-pay | Admitting: Cardiovascular Disease

## 2018-05-28 ENCOUNTER — Ambulatory Visit (INDEPENDENT_AMBULATORY_CARE_PROVIDER_SITE_OTHER): Payer: Medicare Other | Admitting: Cardiovascular Disease

## 2018-05-28 ENCOUNTER — Ambulatory Visit: Payer: Medicare Other | Admitting: Cardiovascular Disease

## 2018-05-28 DIAGNOSIS — R002 Palpitations: Secondary | ICD-10-CM | POA: Diagnosis not present

## 2018-05-28 NOTE — Patient Instructions (Signed)
Medication Instructions:  Your physician recommends that you continue on your current medications as directed. Please refer to the Current Medication list given to you today.   Labwork: NONE  Testing/Procedures: NONE  Follow-Up: We request that you follow-up in: [place], [place] and AS NEEDED with Dr Andria Rhein will receive a reminder letter in the mail two months in advance. If you don't receive a letter, please call our office to schedule the follow-up appointment.    Any Other Special Instructions Will Be Listed Below (If Applicable).     If you need a refill on your cardiac medications before your next appointment, please call your pharmacy.

## 2018-05-28 NOTE — Assessment & Plan Note (Signed)
Angel Holland returns today for evaluation of palpitations.  Her 2D echo was normal.  Her event monitor showed episodes of PAF.  She did see Almyra Deforest PAC back in the office to discuss with her starting oral anticoagulation which she declined.  To begin her on low-dose diltiazem which she says it has improved her symptoms.  She has been taking oral decongestants which may have contributed to her PAF.

## 2018-05-28 NOTE — Progress Notes (Signed)
05/28/2018 Angel Holland   1941/10/14  [id number]  Primary Physician Lucretia Kern, DO Primary Cardiologist: Lorretta Harp MD [facility], [place], [place]  HPI:  Angel Holland is a 77 y.o.  mildly overweight married Caucasian female mother of 2 sons, grandmother 4 grandchildren referred by Dr. Colin Benton for cardiac evaluation because of new onset palpitations.  I last saw her in the office 01/18/2018. Risk factors include 50-pack-years of tobacco abuse having quit 10 years ago. 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 She's never had a heart attack or stroke. She had colonoscopy a month ago had an episode chest pain at that time. Since then she's noticed nocturnal palpitations.   She had a 2D echo performed 02/05/2018 which was entirely normal and a subsequent event monitor did show evidence of PAF.  She did see Almyra Deforest PAC back in the office subsequent to my visit to review the results of her event monitor, began her low-dose diltiazem and discussed oral anticoagulation which she declined to take.    Current Meds  Medication Sig  . acetaminophen (TYLENOL 8 HOUR ARTHRITIS PAIN) 650 MG CR tablet Take 650 mg by mouth at bedtime.   Marland Kitchen aspirin 81 MG tablet Take 81 mg by mouth daily.    . cholecalciferol (VITAMIN D) 1000 UNITS tablet Take 1,000 Units by mouth daily.  . cyanocobalamin 500 MCG tablet Take 500 mcg by mouth daily.  Marland Kitchen dicyclomine (BENTYL) 10 MG capsule TAKE 1 CAPSULE BY MOUTH FOUR TIMES DAILY AS NEEDED FOR SPASMS  . diltiazem (CARDIZEM CD) 120 MG 24 hr capsule Take 1 capsule (120 mg total) by mouth daily.  . Doxylamine Succinate, Sleep, (WAL-SOM PO) Take 1 tablet by mouth daily as needed (sleep).   Marland Kitchen HYDROcodone-acetaminophen (NORCO/VICODIN) 5-325 MG tablet Take 2 tablets by mouth every 4 (four) hours as needed.  Marland Kitchen ibuprofen (ADVIL,MOTRIN) 200 MG tablet Take 200 mg by mouth every 6 (six) hours as needed for headache or mild pain.  Marland Kitchen loperamide (IMODIUM) 2 MG  capsule Take 2 mg by mouth 3 (three) times a week.  . metroNIDAZOLE (METROGEL) 0.75 % gel Apply 1 application topically at bedtime. Applies to face for rosacea  . mometasone (NASONEX) 50 MCG/ACT nasal spray Place 2 sprays into the nose daily as needed (for congestion).  . NON FORMULARY Take 1 tablet by mouth daily. OsteoMatrix  . VITAMIN E PO Take by mouth daily.  . Wheat Dextrin (BENEFIBER DRINK MIX PO) Take 1 each by mouth 2 (two) times daily.   Current Facility-Administered Medications for the 05/28/18 encounter (Office Visit) with Lorretta Harp, MD  Medication  . 0.9 %  sodium chloride infusion     Allergies  Allergen Reactions  . Biaxin [Clarithromycin] Other (See Comments)    BREASTS INFLAMMED  . Codeine Other (See Comments)    PANIC ATTACKS, CAN'T SLEEP    Social History   Socioeconomic History  . Marital status: Married    Spouse name: Not on file  . Number of children: 2  . Years of education: Not on file  . Highest education level: Not on file  Occupational History  . Occupation: retired    Comment: Press photographer  Social Needs  . Financial resource strain: Not on file  . Food insecurity:    Worry: Not on file    Inability: Not on file  . Transportation needs:    Medical: Not on file    Non-medical: Not on  file  Tobacco Use  . Smoking status: Former Smoker    Packs/day: 1.00    Years: 50.00    Pack years: 50.00    Types: Cigarettes    Last attempt to quit: 07/23/2008    Years since quitting: 9.8  . Smokeless tobacco: Never Used  Substance and Sexual Activity  . Alcohol use: No  . Drug use: No  . Sexual activity: Not Currently  Lifestyle  . Physical activity:    Days per week: Not on file    Minutes per session: Not on file  . Stress: Not on file  Relationships  . Social connections:    Talks on phone: Not on file    Gets together: Not on file    Attends religious service: Not on file    Active member of club or organization: Not on file     Attends meetings of clubs or organizations: Not on file    Relationship status: Not on file  . Intimate partner violence:    Fear of current or ex partner: Not on file    Emotionally abused: Not on file    Physically abused: Not on file    Forced sexual activity: Not on file  Other Topics Concern  . 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  Cardiovascular: negative for chest pain, dyspnea on exertion, edema, orthopnea, palpitations, paroxysmal nocturnal dyspnea or shortness of breath Dermatological: negative for rash Respiratory: negative for cough or wheezing Urologic: negative for hematuria Abdominal: negative for nausea, vomiting, diarrhea, bright red blood per rectum, melena, or hematemesis Neurologic: negative for visual changes, syncope, or dizziness All other systems reviewed and are otherwise negative except as noted above.    Blood pressure 124/72, pulse 70, height 4\' 10"  (1.473 m), weight 143 lb (64.9 kg).  General appearance: alert and no distress Neck: no adenopathy, no carotid bruit, no JVD, supple, symmetrical, trachea midline and thyroid not enlarged, symmetric, no tenderness/mass/nodules Lungs: clear to auscultation bilaterally Heart: regular rate and rhythm, S1, S2 normal, no murmur, click, rub or gallop Extremities: extremities normal, atraumatic, no cyanosis or edema Pulses: 2+ and symmetric Skin: Skin color, texture, turgor normal. No rashes or lesions Neurologic: Alert and oriented X 3, normal strength and tone. Normal symmetric reflexes. Normal coordination and gait  EKG not performed today  ASSESSMENT AND PLAN:   Palpitations Ms. Bouie returns  today for evaluation of palpitations.  Her 2D echo was normal.  Her event monitor showed episodes of PAF.  She did see Almyra Deforest PAC back in the office to discuss with her starting oral anticoagulation which she declined.  To begin her on low-dose diltiazem which she says it has improved her symptoms.  She has been taking oral decongestants which may have contributed to her PAF.      Lorretta Harp MD FACP,FACC,FAHA, [facility] 05/28/2018 12:03 PM

## 2018-07-18 NOTE — Progress Notes (Signed)
Medicare Annual Preventive Care Visit  (initial annual wellness or annual wellness exam)  Concerns and/or follow up today:  PMH of obesity, poor mood her whole life (refuses treatment), IBS, rectal bleeding (sees GI), aortic atherosclerosis, hyperglycemia and overweight status and new diagnosis of PAF this year. She is seeing cardiology for the PAF and now is on diltiazem and per notes refused [place]. She reports doing ok. She unfortunately had a bad experience with her cardiologist, reports he told her he didn't have time to help her understand her medications and  Was very angry with her for not taking an [place]. Reports her husband, a friend and another acquaintance had severe and fatal side effects with blood thinners, so she is not going to take them. She now is seeing a PA and reports he is nice to her. Denies CP, sob, palpitations.  Reports has had L upper arm pain that is severe with lying on this side and some movements since a fall in Feb 2019. Does not lift this arm. Denies swelling, bruising, weakness. Due for flu vaccine, labs.   See HM section in Epic for other details of completed HM. See scanned documentation under Media Tab for further documentation HPI, health risk assessment. See Media Tab and Care Teams sections in Epic for other providers.  ROS: negative for report of fevers, unintentional weight loss, vision changes, vision loss, hearing loss or change, chest pain, sob, hemoptysis, melena, hematochezia, hematuria, genital discharge or lesions, falls, bleeding or bruising, loc, thoughts of suicide or self harm, memory loss  1.) Patient-completed health risk assessment  - completed and reviewed, see scanned documentation  2.) Review of Medical History: -PMH, PSH, Family History and current specialty and care providers reviewed and updated and listed below  - see scanned in document in chart and below  Past Medical History:  Diagnosis Date  . Anxiety    no meds  . Arthritis    back and fingers   . Bruxism   . Cataract    baby cataracts per pt.  . Colon polyps   . DDD (degenerative disc disease), lumbar   . DDD (degenerative disc disease), lumbosacral   . Depression    no meds  . Diverticulosis of colon (without mention of hemorrhage)   . Fibroid uterus   . Headache, paroxysmal hemicrania, episodic 03/24/2014  . Hemorrhoids   . History of D&C    x2  . History of night sweats   . History of rectal bleeding    08/2017  . Hx of diverticulitis of colon 03/30/2014  . Hyperglycemia   . Irritable bowel syndrome   . Pulmonary nodules - reports followed by pulmonology, reports following up with them in June 2015 03/26/2013   Repeat CT chest 04/20/14 > 1. Scattered pulmonary nodules measure 4 mm or less in size, are unchanged from 03/28/2013 and are therefore considered benign    . Tobacco abuse   . Unspecified hemorrhoids without mention of complication     Past Surgical History:  Procedure Laterality Date  . APPENDECTOMY  1957  . bce    . CHOLECYSTECTOMY  2009  . DILATION AND CURETTAGE OF UTERUS    . fibroid removed from uterus    . HYSTEROSCOPY W/D&C N/A 09/09/2015   Procedure: DILATATION AND CURETTAGE /HYSTEROSCOPY;  Surgeon: Vanessa Kick, MD;  Location: [facility];  Service: Gynecology;  Laterality: N/A;    Social History   Socioeconomic History  . Marital status: Married    Spouse name: Not  on file  . Number of children: 2  . Years of education: Not on file  . Highest education level: Not on file  Occupational History  . Occupation: retired    Comment: Press photographer  Social Needs  . Financial resource strain: Not on file  . Food insecurity:    Worry: Not on file    Inability: Not on file  . Transportation needs:    Medical: Not on file    Non-medical: Not on file  Tobacco Use  . Smoking status: Former Smoker    Packs/day: 1.00    Years: 50.00    Pack years: 50.00    Types: Cigarettes    Last attempt to quit: 07/23/2008    Years since quitting:  10.0  . Smokeless tobacco: Never Used  Substance and Sexual Activity  . Alcohol use: No  . Drug use: No  . Sexual activity: Not Currently  Lifestyle  . Physical activity:    Days per week: Not on file    Minutes per session: Not on file  . Stress: Not on file  Relationships  . Social connections:    Talks on phone: Not on file    Gets together: Not on file    Attends religious service: Not on file    Active member of club or organization: Not on file    Attends meetings of clubs or organizations: Not on file    Relationship status: Not on file  . Intimate partner violence:    Fear of current or ex partner: Not on file    Emotionally abused: Not on file    Physically abused: Not on file    Forced sexual activity: Not on file  Other Topics Concern  . 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 Heart disease Mother        CHF  . Diverticulosis Mother   . Bone cancer Father        found in his mouth first  . Colon cancer Neg Hx     Current Outpatient Medications on File Prior to Visit  Medication Sig Dispense Refill  . acetaminophen (TYLENOL) 500 MG tablet Take 500 mg by mouth as needed.    Marland Kitchen aspirin 81 MG tablet Take 81 mg by mouth daily.      . cholecalciferol (VITAMIN D) 1000 UNITS tablet Take 1,000 Units by mouth daily.    . cyanocobalamin 500 MCG tablet Take 500 mcg by mouth daily.    Marland Kitchen dicyclomine (BENTYL) 10 MG capsule TAKE 1 CAPSULE BY MOUTH FOUR TIMES DAILY AS NEEDED FOR SPASMS 120 capsule 0  . diltiazem (CARDIZEM CD) 120 MG 24 hr capsule Take 1 capsule (120 mg total) by mouth daily. 90 capsule 3  . Doxylamine Succinate, Sleep, (WAL-SOM PO) Take 1 tablet by mouth daily as needed (sleep).     Marland Kitchen  loperamide (IMODIUM) 2 MG capsule Take 2 mg by mouth as needed.     . metroNIDAZOLE (METROGEL) 0.75 % gel Apply 1 application topically at bedtime. Applies to face for rosacea    .  NON FORMULARY Take 1 tablet by mouth daily. OsteoMatrix    . triamcinolone (NASACORT ALLERGY 24HR) 55 MCG/ACT AERO nasal inhaler Place 2 sprays into the nose as needed.    Marland Kitchen VITAMIN E PO Take by mouth daily.    . Wheat Dextrin (BENEFIBER DRINK MIX PO) Take 1 each by mouth 2 (two) times daily.     Current Facility-Administered Medications on File Prior to Visit  Medication Dose Route Frequency Provider Last Rate Last Dose  . 0.9 %  sodium chloride infusion  500 mL Intravenous Once Ladene Artist, MD         3.) Review of functional ability and level of safety:  Any difficulty hearing?  See scanned documentation  History of falling?  See scanned documentation  Any trouble with IADLs - using a phone, using transportation, grocery shopping, preparing meals, doing housework, doing laundry, taking medications and managing money?  See scanned documentation  Advance Directives?  Discussed briefly and offered more resources and detailed discussion with our trained staff.   See summary of recommendations in Patient Instructions below.  4.) Physical Exam Vitals:   07/22/18 0807  BP: 118/80  Pulse: 73  Temp: 98.1 F (36.7 C)   Estimated body mass index is 29.07 kg/m as calculated from the following:   Height as of this encounter: 4' 10.75" (1.492 m).   Weight as of this encounter: 142 lb 11.2 oz (64.7 kg). Body mass index is 29.07 kg/m.  EKG (optional): deferred  General: alert, appear well hydrated and in no acute distress  HEENT: visual acuity grossly intact  CV: irr irr  Lungs: CTA bilaterally  MS: mid to upper L humeral shaft TTP on exam, pain with abd, flex arm  Psych: pleasant and cooperative, no obvious depression or anxiety  Cognitive function grossly intact  See patient  instructions for recommendations.  Education and counseling regarding the above review of health provided with a plan for the following: -see scanned patient completed form for further details -fall prevention strategies discussed  -healthy lifestyle discussed -importance and resources for completing advanced directives discussed -see patient instructions below for any other recommendations provided  4)The following written screening schedule of preventive measures were reviewed with assessment and plan made per below, orders and patient instructions:      AAA screening done if applicable     Alcohol screening done     Obesity Screening and counseling done     STI screening (Hep C if born [birth year]) offered and per pt wishes     Tobacco Screening done done       Pneumococcal (PPSV23 -one dose after 64, one before if risk factors), influenza yearly and hepatitis B vaccines (if high risk - end stage renal disease, IV drugs, homosexual men, live in home for mentally retarded, hemophilia receiving factors) ASSESSMENT/PLAN: done      Screening mammograph (yearly if >40) ASSESSMENT/PLAN: utd      Screening Pap smear/pelvic exam (q2 years) ASSESSMENT/PLAN: n/a, declined      Colorectal cancer screening (FOBT yearly or flex sig q4y or colonoscopy q10y or barium enema q4y) ASSESSMENT/PLAN: utd      Diabetes outpatient self-management training services ASSESSMENT/PLAN: utd or done      Bone mass measurements(covered q2y if indicated - estrogen def, osteoporosis, hyperparathyroid, vertebral abnormalities, osteoporosis or steroids) ASSESSMENT/PLAN: refused      Screening for glaucoma(q1y if high risk - diabetes, FH, AA and > 50 or hispanic and > 65) ASSESSMENT/PLAN: utd or advised  Medical nutritional therapy for individuals with diabetes or renal disease ASSESSMENT/PLAN: see orders      Cardiovascular screening blood tests (lipids q5y) ASSESSMENT/PLAN: see orders and labs       Diabetes screening tests ASSESSMENT/PLAN: see orders and labs   7.) Summary:   Medicare annual wellness visit, subsequent -risk factors and conditions per above assessment were discussed and treatment, recommendations and referrals were offered per documentation above and orders and patient instructions.  Screening for depression -see epic  Left arm pain - Plan: DG Humerus Left -palin films, sig pain so if unrevealing advised she see her orthopedic specialist for further eval  Aortic atherosclerosis (HCC) Atrial fibrillation, unspecified type ([place]) - Plan: Basic metabolic panel, CBC Hyperglycemia - Plan: Hemoglobin A1c Hyperlipidemia, unspecified hyperlipidemia type - Plan: Lipid panel -labs today, seeing cardiologist  Patient Instructions   BEFORE YOU LEAVE: -flu shot -see if she has had or wants shinrix vaccine series -xray -labs -follow up: 3-4 months  For the shoulder pain - get the xray. If persists - see your orthopedic office or follow up here.   Ms. Busser , Thank you for taking time to come for your Medicare Wellness Visit. I appreciate your ongoing commitment to your health goals. Please review the following plan we discussed and let me know if I can assist you in the future.   These are the goals we discussed: Goals   Healthy diet and regular exercise Flu shot today Consider new shingles vaccine     This is a list of the screening recommended for you and due dates:  Health Maintenance  Topic Date Due  . DEXA scan (bone density measurement)  07/11/2025*  . Mammogram  02/23/2019  . Tetanus Vaccine  11/02/2024  . Flu Shot  Completed  . Pneumonia vaccines  Completed  *Topic was postponed. The date shown is not the original due date.      Lucretia Kern, DO

## 2018-07-19 ENCOUNTER — Ambulatory Visit: Payer: Medicare Other | Admitting: Cardiovascular Disease

## 2018-07-22 ENCOUNTER — Ambulatory Visit (INDEPENDENT_AMBULATORY_CARE_PROVIDER_SITE_OTHER): Payer: Medicare Other | Admitting: Family Medicine

## 2018-07-22 ENCOUNTER — Encounter: Payer: Self-pay | Admitting: Family Medicine

## 2018-07-22 ENCOUNTER — Ambulatory Visit (INDEPENDENT_AMBULATORY_CARE_PROVIDER_SITE_OTHER): Payer: Medicare Other

## 2018-07-22 VITALS — BP 118/80 | HR 73 | Temp 98.1°F | Ht 58.75 in | Wt 142.7 lb

## 2018-07-22 DIAGNOSIS — Z23 Encounter for immunization: Secondary | ICD-10-CM

## 2018-07-22 DIAGNOSIS — M79602 Pain in left arm: Secondary | ICD-10-CM | POA: Diagnosis not present

## 2018-07-22 DIAGNOSIS — Z1331 Encounter for screening for depression: Secondary | ICD-10-CM

## 2018-07-22 DIAGNOSIS — Z0000 Encounter for general adult medical examination without abnormal findings: Secondary | ICD-10-CM

## 2018-07-22 DIAGNOSIS — R739 Hyperglycemia, unspecified: Secondary | ICD-10-CM | POA: Diagnosis not present

## 2018-07-22 DIAGNOSIS — I4891 Unspecified atrial fibrillation: Secondary | ICD-10-CM | POA: Diagnosis not present

## 2018-07-22 DIAGNOSIS — I700 Atherosclerosis of aorta: Secondary | ICD-10-CM

## 2018-07-22 DIAGNOSIS — E785 Hyperlipidemia, unspecified: Secondary | ICD-10-CM

## 2018-07-22 DIAGNOSIS — M19042 Primary osteoarthritis, left hand: Secondary | ICD-10-CM | POA: Diagnosis not present

## 2018-07-22 LAB — CBC
HCT: 40.8 % (ref 36.0–46.0)
HEMOGLOBIN: 13.8 g/dL (ref 12.0–15.0)
MCHC: 33.8 g/dL (ref 30.0–36.0)
MCV: 85.2 fl (ref 78.0–100.0)
PLATELETS: 309 10*3/uL (ref 150.0–400.0)
RBC: 4.79 Mil/uL (ref 3.87–5.11)
RDW: 14.8 % (ref 11.5–15.5)
WBC: 7.1 10*3/uL (ref 4.0–10.5)

## 2018-07-22 LAB — BASIC METABOLIC PANEL
BUN: 16 mg/dL (ref 6–23)
CHLORIDE: 102 meq/L (ref 96–112)
CO2: 26 meq/L (ref 19–32)
Calcium: 9.9 mg/dL (ref 8.4–10.5)
Creatinine, Ser: 0.77 mg/dL (ref 0.40–1.20)
GFR: 77.1 mL/min (ref >60.00)
GLUCOSE: 126 mg/dL — AB (ref 70–99)
POTASSIUM: 4.7 meq/L (ref 3.5–5.1)
SODIUM: 139 meq/L (ref 135–145)

## 2018-07-22 LAB — LIPID PANEL
CHOL/HDL RATIO: 4
Cholesterol: 146 mg/dL (ref 0–200)
HDL: 37 mg/dL — ABNORMAL LOW (ref >39.00)
LDL CALC: 91 mg/dL (ref 0–99)
NONHDL: 109.46
TRIGLYCERIDES: 93 mg/dL (ref 0.0–149.0)
VLDL: 18.6 mg/dL (ref 0.0–40.0)

## 2018-07-22 LAB — HEMOGLOBIN A1C: Hgb A1c MFr Bld: 6.8 % — ABNORMAL HIGH (ref 4.6–6.5)

## 2018-07-22 NOTE — Patient Instructions (Signed)
BEFORE YOU LEAVE: -flu shot -see if she has had or wants shinrix vaccine series -xray -labs -follow up: 3-4 months  For the shoulder pain - get the xray. If persists - see your orthopedic office or follow up here.   Angel Holland , Thank you for taking time to come for your Medicare Wellness Visit. I appreciate your ongoing commitment to your health goals. Please review the following plan we discussed and let me know if I can assist you in the future.   These are the goals we discussed: Goals   Healthy diet and regular exercise Flu shot today Consider new shingles vaccine     This is a list of the screening recommended for you and due dates:  Health Maintenance  Topic Date Due  . DEXA scan (bone density measurement)  07/11/2025*  . Mammogram  02/23/2019  . Tetanus Vaccine  11/02/2024  . Flu Shot  Completed  . Pneumonia vaccines  Completed  *Topic was postponed. The date shown is not the original due date.

## 2018-07-22 NOTE — Addendum Note (Signed)
Addended by: Agnes Lawrence on: 07/22/2018 09:03 AM   Modules accepted: Orders

## 2018-08-05 ENCOUNTER — Other Ambulatory Visit: Payer: Self-pay | Admitting: Gastroenterology

## 2018-08-22 DIAGNOSIS — L820 Inflamed seborrheic keratosis: Secondary | ICD-10-CM | POA: Diagnosis not present

## 2018-08-22 DIAGNOSIS — D1801 Hemangioma of skin and subcutaneous tissue: Secondary | ICD-10-CM | POA: Diagnosis not present

## 2018-08-22 DIAGNOSIS — L570 Actinic keratosis: Secondary | ICD-10-CM | POA: Diagnosis not present

## 2018-08-22 DIAGNOSIS — Z85828 Personal history of other malignant neoplasm of skin: Secondary | ICD-10-CM | POA: Diagnosis not present

## 2018-08-22 DIAGNOSIS — L814 Other melanin hyperpigmentation: Secondary | ICD-10-CM | POA: Diagnosis not present

## 2018-08-22 DIAGNOSIS — L821 Other seborrheic keratosis: Secondary | ICD-10-CM | POA: Diagnosis not present

## 2018-08-22 DIAGNOSIS — L918 Other hypertrophic disorders of the skin: Secondary | ICD-10-CM | POA: Diagnosis not present

## 2018-10-23 ENCOUNTER — Telehealth: Payer: Self-pay | Admitting: Cardiology

## 2018-10-23 NOTE — Telephone Encounter (Signed)
Pt called with upper resp cold symptoms and wanted to know what she could take with dilt.  She has had since Friday, recommended seeing PCP tomorrow or urgent care but no pseudocodeine.  Pt agreeable.  No chest pain and no SOB.

## 2018-10-30 NOTE — Progress Notes (Signed)
HPI:  Using dictation device. Unfortunately this device frequently misinterprets words/phrases.  Angel Holland is a pleasant 78 y.o. here for follow up. Chronic medical problems summarized below were reviewed for changes and stability and were updated as needed below. These issues and their treatment remain stable for the most part. She does not want to address the cholesterol/diabetes today when I bring it up and suggest low dose statin. She is feeling sick. Reports symptoms started about 1-2 weeks ago with nasal congestion, pnd, cough, fatigue. Persistent and cough and sinus issues worsened. Lots of congestin. Denies CP, SOB, DOE, treatment intolerance or new symptoms.   Obesity/Diabetes/Dyslipidemia: -treated with lifestyle -refused statin and said "not worried" about the diabetes  IBS/Hx rectal bleeding: -sees GI -meds: imodium prn, bentyl  PAF/Aortic Atherosclerosis: -sees cardiology for management -meds: diltiazem, asa -refused [place] as acquaintances died from fatal side effects to blood thinners -had bad experience with Dr. Gwenlyn Holland - reports he was angry, rude and rushed; now seeing PA  Depression: -her whole life per her report -refuses treatment  ROS: See pertinent positives and negatives per HPI.  Past Medical History:  Diagnosis Date  . Anxiety    no meds  . Arthritis    back and fingers   . Bruxism   . Cataract    baby cataracts per pt.  . Colon polyps   . DDD (degenerative disc disease), lumbar   . DDD (degenerative disc disease), lumbosacral   . Depression    no meds  . Diverticulosis of colon (without mention of hemorrhage)   . Fibroid uterus   . Headache, paroxysmal hemicrania, episodic 03/24/2014  . Hemorrhoids   . History of D&C    x2  . History of night sweats   . History of rectal bleeding    08/2017  . Hx of diverticulitis of colon 03/30/2014  . Hyperglycemia   . Irritable bowel syndrome   . Pulmonary nodules - reports followed by pulmonology,  reports following up with them in June 2015 03/26/2013   Repeat CT chest 04/20/14 > 1. Scattered pulmonary nodules measure 4 mm or less in size, are unchanged from 03/28/2013 and are therefore considered benign    . Tobacco abuse   . Unspecified hemorrhoids without mention of complication     Past Surgical History:  Procedure Laterality Date  . APPENDECTOMY  1957  . bce    . CHOLECYSTECTOMY  2009  . DILATION AND CURETTAGE OF UTERUS    . fibroid removed from uterus    . HYSTEROSCOPY W/D&C N/A 09/09/2015   Procedure: DILATATION AND CURETTAGE /HYSTEROSCOPY;  Surgeon: Angel Kick, MD;  Location: [facility];  Service: Gynecology;  Laterality: N/A;    Family History  Problem Relation Age of Onset  . Heart disease Mother        CHF  . Diverticulosis Mother   . Bone cancer Father        Holland in his mouth first  . Colon cancer Neg Hx     SOCIAL HX: see hpi   Current Outpatient Medications:  .  acetaminophen (TYLENOL) 500 MG tablet, Take 500 mg by mouth as needed., Disp: , Rfl:  .  Ascorbic Acid (VITAMIN C PO), Take by mouth daily., Disp: , Rfl:  .  aspirin 81 MG tablet, Take 81 mg by mouth daily.  , Disp: , Rfl:  .  cholecalciferol (VITAMIN D) 1000 UNITS tablet, Take 1,000 Units by mouth daily., Disp: , Rfl:  .  cyanocobalamin 500 MCG  tablet, Take 500 mcg by mouth daily., Disp: , Rfl:  .  dicyclomine (BENTYL) 10 MG capsule, TAKE 1 CAPSULE BY MOUTH FOUR TIMES DAILY AS NEEDED FOR SPASMS, Disp: 120 capsule, Rfl: 0 .  diltiazem (CARDIZEM CD) 120 MG 24 hr capsule, Take 1 capsule (120 mg total) by mouth daily., Disp: 90 capsule, Rfl: 3 .  Doxylamine Succinate, Sleep, (WAL-SOM PO), Take 1 tablet by mouth daily as needed (sleep). , Disp: , Rfl:  .  loperamide (IMODIUM) 2 MG capsule, Take 2 mg by mouth as needed. , Disp: , Rfl:  .  metroNIDAZOLE (METROGEL) 0.75 % gel, Apply 1 application topically at bedtime. Applies to face for rosacea, Disp: , Rfl:  .  NON FORMULARY, Take 1 tablet by mouth daily.  OsteoMatrix, Disp: , Rfl:  .  triamcinolone (NASACORT ALLERGY 24HR) 55 MCG/ACT AERO nasal inhaler, Place 2 sprays into the nose as needed., Disp: , Rfl:  .  VITAMIN E PO, Take by mouth daily., Disp: , Rfl:  .  Wheat Dextrin (BENEFIBER DRINK MIX PO), Take 1 each by mouth 2 (two) times daily., Disp: , Rfl:  .  benzonatate (TESSALON PERLES) 100 MG capsule, Take 1 capsule (100 mg total) by mouth 3 (three) times daily as needed., Disp: 20 capsule, Rfl: 0 .  doxycycline (VIBRA-TABS) 100 MG tablet, Take 1 tablet (100 mg total) by mouth 2 (two) times daily., Disp: 14 tablet, Rfl: 0  Current Facility-Administered Medications:  .  0.9 %  sodium chloride infusion, 500 mL, Intravenous, Once, Ladene Artist, MD  EXAM:  Vitals:   10/31/18 1006  BP: 120/80  Pulse: 72  Temp: 98.2 F (36.8 C)  SpO2: 95%    Body mass index is 28.72 kg/m.  GENERAL: vitals reviewed and listed above, alert, oriented, appears well hydrated and in no acute distress  HEENT: atraumatic, conjunttiva clear, no obvious abnormalities on inspection of external nose and ears  NECK: no obvious masses on inspection  LUNGS: clear to auscultation bilaterally, no wheezes, rales or rhonchi, good air movement  CV: HRRR, no peripheral edema  MS: moves all extremities without noticeable abnormality  PSYCH: pleasant and cooperative, no obvious depression or anxiety  ASSESSMENT AND PLAN:  Discussed the following assessment and plan:  Respiratory infection --we discussed possible serious and likely etiologies, workup and treatment, treatment risks and return precautions -after this discussion, Angel Holland opted for tiral abx for possible sinusitis vs other given duration symptoms and worsening, declined cxr* -of course, we advised Angel Holland  to return or notify a doctor immediately if symptoms worsen or persist or new concerns arise.  Hyperglycemia -discussed dx diabetes and implications -advised statin, refused today -discuss more  at follow up when feeling better and check labs then in 1 month  Aortic atherosclerosis (Angel Holland) -advised statin, refused  -Patient advised to return or notify a doctor immediately if symptoms worsen or persist or new concerns arise.  Patient Instructions  BEFORE YOU LEAVE: -follow up: 1 month - with labs  Take the antibiotic, Doxycycline for the respiratory infection if worsening or not improving over the next 1-2 days.  Tessalon for cough.  Nasal saline twice daily.     Lucretia Kern, DO

## 2018-10-31 ENCOUNTER — Encounter: Payer: Self-pay | Admitting: Family Medicine

## 2018-10-31 ENCOUNTER — Telehealth: Payer: Self-pay | Admitting: Cardiovascular Disease

## 2018-10-31 ENCOUNTER — Ambulatory Visit (INDEPENDENT_AMBULATORY_CARE_PROVIDER_SITE_OTHER): Payer: Medicare Other | Admitting: Family Medicine

## 2018-10-31 VITALS — BP 120/80 | HR 72 | Temp 98.2°F | Ht 58.75 in | Wt 141.0 lb

## 2018-10-31 DIAGNOSIS — J988 Other specified respiratory disorders: Secondary | ICD-10-CM | POA: Diagnosis not present

## 2018-10-31 DIAGNOSIS — R739 Hyperglycemia, unspecified: Secondary | ICD-10-CM | POA: Diagnosis not present

## 2018-10-31 DIAGNOSIS — I700 Atherosclerosis of aorta: Secondary | ICD-10-CM

## 2018-10-31 MED ORDER — BENZONATATE 100 MG PO CAPS: 100.0000 mg | ORAL_CAPSULE | Freq: Three times a day (TID) | ORAL | 0 refills | Status: DC | PRN

## 2018-10-31 MED ORDER — DOXYCYCLINE HYCLATE 100 MG PO TABS: 100.0000 mg | ORAL_TABLET | Freq: Two times a day (BID) | ORAL | 0 refills | Status: DC

## 2018-10-31 NOTE — Telephone Encounter (Signed)
New message      Pt c/o medication issue:  1. Name of Medication:  diltiazem (CARDIZEM CD) 120 MG 24 hr capsule  2. How are you currently taking this medication (dosage and times per day)?  1 pill Once a day   3. Are you having a reaction (difficulty breathing--STAT)? No   4. What is your medication issue? pcp put pt on doxycycline (VIBRA-TABS) 100 MG tablet, benzonatate (TESSALON PERLES) 100 MG capsule and wants to know if interaction . Pt also wants to know if she can take a pill for an yeast infection while on this medication. Also Has concerns about cessalon for cough.

## 2018-10-31 NOTE — Telephone Encounter (Signed)
Pt updated with Pharm D's recommendation. Pt verbalized understanding.

## 2018-10-31 NOTE — Telephone Encounter (Signed)
Spoke with pt who states she was seen by her pcp today and prescribed doxycycline and tessalon perles. Pt states she was calling to see if she could take those medication, and a pill for yeast infection with her Diltiazem. Will route to Pharm D.

## 2018-10-31 NOTE — Patient Instructions (Signed)
BEFORE YOU LEAVE: -follow up: 1 month - with labs  Take the antibiotic, Doxycycline for the respiratory infection if worsening or not improving over the next 1-2 days.  Tessalon for cough.  Nasal saline twice daily.

## 2018-10-31 NOTE — Telephone Encounter (Signed)
These are okay to take.  If she will need a second dose of the fluconazole (? Yeast infection pill?), then she would be best to hold her diltiazem on the day of the second dose.

## 2018-11-26 ENCOUNTER — Ambulatory Visit (INDEPENDENT_AMBULATORY_CARE_PROVIDER_SITE_OTHER): Payer: Medicare Other | Admitting: Cardiology

## 2018-11-26 ENCOUNTER — Encounter: Payer: Self-pay | Admitting: Cardiology

## 2018-11-26 VITALS — BP 128/72 | HR 70 | Ht <= 58 in | Wt 142.0 lb

## 2018-11-26 DIAGNOSIS — Z8719 Personal history of other diseases of the digestive system: Secondary | ICD-10-CM

## 2018-11-26 DIAGNOSIS — I480 Paroxysmal atrial fibrillation: Secondary | ICD-10-CM | POA: Diagnosis not present

## 2018-11-26 NOTE — Progress Notes (Signed)
11/26/2018 Angel Holland   1941-03-06  [id number]  Primary Physician Angel Kern, DO Primary Cardiologist: Dr Angel Holland  HPI: Angel Holland is a delightful 78 year old married female who was originally seen by Angel Holland for palpitations.  A monitor documented PAF.  Echocardiogram in April 2019 revealed normal LV function.  We discussed oral anticoagulation with the patient but she has had family members who have had severe reactions and bad experiences with oral anticoagulants.  She declined but agreed to take an aspirin a day.  It turns out that she was taking some over-the-counter sinus medication at the time she had atrial fibrillation and this may have precipitated it.  She was placed on low-dose diltiazem and has done well on this.  She does not have palpitations and her EKG today shows sinus rhythm at 70.   Current Outpatient Medications  Medication Sig Dispense Refill  . acetaminophen (TYLENOL) 500 MG tablet Take 500 mg by mouth as needed.    . Ascorbic Acid (VITAMIN C PO) Take by mouth daily.    Marland Kitchen aspirin 81 MG tablet Take 81 mg by mouth daily.      . cholecalciferol (VITAMIN D) 1000 UNITS tablet Take 1,000 Units by mouth daily.    . cyanocobalamin 500 MCG tablet Take 500 mcg by mouth daily.    Marland Kitchen dicyclomine (BENTYL) 10 MG capsule TAKE 1 CAPSULE BY MOUTH FOUR TIMES DAILY AS NEEDED FOR SPASMS 120 capsule 0  . diltiazem (CARDIZEM CD) 120 MG 24 hr capsule Take 1 capsule (120 mg total) by mouth daily. 90 capsule 3  . Doxylamine Succinate, Sleep, (WAL-SOM PO) Take 1 tablet by mouth daily as needed (sleep).     Marland Kitchen loperamide (IMODIUM) 2 MG capsule Take 2 mg by mouth as needed.     . metroNIDAZOLE (METROGEL) 0.75 % gel Apply 1 application topically at bedtime. Applies to face for rosacea    . NON FORMULARY Take 1 tablet by mouth daily. OsteoMatrix    . triamcinolone (NASACORT ALLERGY 24HR) 55 MCG/ACT AERO nasal inhaler Place 2 sprays into the nose as needed.    . Wheat Dextrin (BENEFIBER DRINK MIX PO)  Take 1 each by mouth 2 (two) times daily.     Current Facility-Administered Medications  Medication Dose Route Frequency Provider Last Rate Last Dose  . 0.9 %  sodium chloride infusion  500 mL Intravenous Once Ladene Artist, MD        Allergies  Allergen Reactions  . Biaxin [Clarithromycin] Other (See Comments)    BREASTS INFLAMMED  . Codeine Other (See Comments)    PANIC ATTACKS, CAN'T SLEEP  . Decongestant [Pseudoephedrine Hcl Er]     Cannot take with Dilitiazem  . Phenylephrine     Cannot take with Dilitiazem    Past Medical History:  Diagnosis Date  . Anxiety    no meds  . Arthritis    back and fingers   . Bruxism   . Cataract    baby cataracts per pt.  . Colon polyps   . DDD (degenerative disc disease), lumbar   . DDD (degenerative disc disease), lumbosacral   . Depression    no meds  . Diverticulosis of colon (without mention of hemorrhage)   . Fibroid uterus   . Headache, paroxysmal hemicrania, episodic 03/24/2014  . Hemorrhoids   . History of D&C    x2  . History of night sweats   . History of rectal bleeding    08/2017  . Hx  of diverticulitis of colon 03/30/2014  . Hyperglycemia   . Irritable bowel syndrome   . PAF (paroxysmal atrial fibrillation) ([place]) 01/2018   pt declined [place]  . Pulmonary nodules - reports followed by pulmonology, reports following up with them in June 2015 03/26/2013   Repeat CT chest 04/20/14 > 1. Scattered pulmonary nodules measure 4 mm or less in size, are unchanged from 03/28/2013 and are therefore considered benign    . Tobacco abuse   . Unspecified hemorrhoids without mention of complication     Social History   Socioeconomic History  . Marital status: Married    Spouse name: Not on file  . Number of children: 2  . Years of education: Not on file  . Highest education level: Not on file  Occupational History  . Occupation: retired    Comment: Press photographer  Social Needs  . Financial resource strain: Not on file  . Food  insecurity:    Worry: Not on file    Inability: Not on file  . Transportation needs:    Medical: Not on file    Non-medical: Not on file  Tobacco Use  . Smoking status: Former Smoker    Packs/day: 1.00    Years: 50.00    Pack years: 50.00    Types: Cigarettes    Last attempt to quit: 07/23/2008    Years since quitting: 10.3  . Smokeless tobacco: Never Used  Substance and Sexual Activity  . Alcohol use: No  . Drug use: No  . Sexual activity: Not Currently  Lifestyle  . Physical activity:    Days per week: Not on file    Minutes per session: Not on file  . Stress: Not on file  Relationships  . Social connections:    Talks on phone: Not on file    Gets together: Not on file    Attends religious service: Not on file    Active member of club or organization: Not on file    Attends meetings of clubs or organizations: Not on file    Relationship status: Not on file  . Intimate partner violence:    Fear of current or ex partner: Not on file    Emotionally abused: Not on file    Physically abused: Not on file    Forced sexual activity: Not on file  Other Topics Concern  . 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 Heart disease Mother        CHF  . Diverticulosis Mother   . Bone cancer Father        Holland in his mouth first  . Colon cancer Neg Hx      Review of Systems: General: negative for chills, fever, night sweats or weight changes.  Cardiovascular: negative for chest pain, dyspnea on exertion, edema, orthopnea, palpitations, paroxysmal nocturnal dyspnea or shortness of breath Dermatological: negative for rash Respiratory: negative for cough or wheezing Urologic: negative for  hematuria Abdominal: negative for nausea, vomiting, diarrhea, bright red blood per rectum, melena, or hematemesis Neurologic: negative for visual changes, syncope,  or dizziness The patient denies any history of snoring.  She has some dental issues and has to wear a mild mouthguard at night and this may actually be preventing sleep apnea issues. All other systems reviewed and are otherwise negative except as noted above.    Blood pressure 128/72, pulse 70, height 4\' 10"  (1.473 m), weight 142 lb (64.4 kg), SpO2 98 %.  General appearance: alert, cooperative, no distress and mildly obese Lungs: clear to auscultation bilaterally Heart: regular rate and rhythm Extremities: no edema Skin: Skin color, texture, turgor normal. No rashes or lesions Neurologic: Grossly normal  EKG [id number]  ASSESSMENT AND PLAN:   PAF (paroxysmal atrial fibrillation) ([place]) Documented April 2019 on monitor- CHADS VASC=3  pt declines [place]- (family members and her husband had bleeding issues on [facility]).Marland Kitchen Echo showed normal LVF with grade 1 DD, mild pulmonary HTN-PA 38 mmHg   PLAN The patient occasionally requires decongestants.  She had an injury to her nose when she was accidentally hit by her dog.  Apparently the left side of her nasal passage is blocked.  I suggested she could take regular Claritin 10 mg if she needed to, otherwise she should stick with her Nasacort inhaler.  She had been put on doxycycline recently for URI.  She asked about an antifungal prescription and I referred her to her PCP about this.  We will be glad to see her back in 6 months or sooner if needed.  Kerin Ransom PA-C 11/26/2018 10:36 AM

## 2018-11-26 NOTE — Assessment & Plan Note (Signed)
Documented April 2019 on monitor- CHADS VASC=3  pt declines [place]- (family members and her husband had bleeding issues on [facility]).Marland Kitchen Echo showed normal LVF with grade 1 DD, mild pulmonary HTN-PA 38 mmHg

## 2018-11-26 NOTE — Patient Instructions (Signed)
Medication Instructions:  You can take Clartin 10mg  over the counter once a day as needed for congestion If you need a refill on your cardiac medications before your next appointment, please call your pharmacy.   Lab work: None  If you have labs (blood work) drawn today and your tests are completely normal, you will receive your results only by: Marland Kitchen MyChart Message (if you have MyChart) OR . A paper copy in the mail If you have any lab test that is abnormal or we need to change your treatment, we will call you to review the results.  Testing/Procedures: None   Follow-Up: At [facility], you and your health needs are our priority.  As part of our continuing mission to provide you with exceptional heart care, we have created designated Provider Care Teams.  These Care Teams include your primary Cardiologist (physician) and Advanced Practice Providers (APPs -  Physician Assistants and Nurse Practitioners) who all work together to provide you with the care you need, when you need it. You will need a follow up appointment in 6 months.  Please call our office 2 months (May 2020) in advance to schedule this appointment.  You may see Kerin Ransom, PA-C.  Any Other Special Instructions Will Be Listed Below (If Applicable).

## 2018-12-01 NOTE — Progress Notes (Signed)
HPI:  Using dictation device. Unfortunately this device frequently misinterprets words/phrases.  Angel Holland is a pleasant 78 y.o. here for follow up. Chronic medical problems summarized below were reviewed for changes and stability and were updated as needed below. These issues and their treatment remain stable for the most part.  Reports is doing so much better! Had resp illness last visit and resolved with abx. She is now ready to discuss the diabetes. Working on diet and will recheck. She agrees to low dose statin. Has yeast infection from abx. vulvovag pruritis. Requests diflucan as topical products have not helped.  Denies CP, SOB, DOE, treatment intolerance or new symptoms.  AWV with me 06/2018  Overweight BMI/Diabetes/Dyslipidemia: -treated with lifestyle -agreed to low dose statin  IBS/Hx rectal bleeding: -sees GI -meds: imodium prn, bentyl  PAF/Aortic Atherosclerosis: -sees cardiology for management -meds: diltiazem, asa -refused [place] as acquaintances died from fatal side effects to blood thinners -had bad experience with Dr. Gwenlyn Found - reports he was angry, rude and rushed; now seeing PA  -? If OTC cold medication precipitated the A. Fib per cardiology notes  Depression: -her whole life per her report -refuses treatment  ROS: See pertinent positives and negatives per HPI.  Past Medical History:  Diagnosis Date  . Anxiety    no meds  . Arthritis    back and fingers   . Bruxism   . Cataract    baby cataracts per pt.  . Colon polyps   . DDD (degenerative disc disease), lumbar   . DDD (degenerative disc disease), lumbosacral   . Depression    no meds  . Diverticulosis of colon (without mention of hemorrhage)   . Fibroid uterus   . Headache, paroxysmal hemicrania, episodic 03/24/2014  . Hemorrhoids   . History of D&C    x2  . History of night sweats   . History of rectal bleeding    08/2017  . Hx of diverticulitis of colon 03/30/2014  . Hyperglycemia   .  Irritable bowel syndrome   . PAF (paroxysmal atrial fibrillation) ([place]) 01/2018   pt declined [place]  . Pulmonary nodules - reports followed by pulmonology, reports following up with them in June 2015 03/26/2013   Repeat CT chest 04/20/14 > 1. Scattered pulmonary nodules measure 4 mm or less in size, are unchanged from 03/28/2013 and are therefore considered benign    . Tobacco abuse   . Unspecified hemorrhoids without mention of complication     Past Surgical History:  Procedure Laterality Date  . APPENDECTOMY  1957  . bce    . CHOLECYSTECTOMY  2009  . DILATION AND CURETTAGE OF UTERUS    . fibroid removed from uterus    . HYSTEROSCOPY W/D&C N/A 09/09/2015   Procedure: DILATATION AND CURETTAGE /HYSTEROSCOPY;  Surgeon: Vanessa Kick, MD;  Location: [facility];  Service: Gynecology;  Laterality: N/A;    Family History  Problem Relation Age of Onset  . Heart disease Mother        CHF  . Diverticulosis Mother   . Bone cancer Father        found in his mouth first  . Colon cancer Neg Hx     SOCIAL HX: se ehpi   Current Outpatient Medications:  .  Ascorbic Acid (VITAMIN C PO), Take by mouth daily., Disp: , Rfl:  .  aspirin 81 MG tablet, Take 81 mg by mouth daily.  , Disp: , Rfl:  .  cholecalciferol (VITAMIN D) 1000 UNITS tablet,  Take 1,000 Units by mouth daily., Disp: , Rfl:  .  Cyanocobalamin (VITAMIN B-12 PO), Take by mouth daily., Disp: , Rfl:  .  dicyclomine (BENTYL) 10 MG capsule, TAKE 1 CAPSULE BY MOUTH FOUR TIMES DAILY AS NEEDED FOR SPASMS, Disp: 120 capsule, Rfl: 0 .  diltiazem (CARDIZEM CD) 120 MG 24 hr capsule, Take 1 capsule (120 mg total) by mouth daily., Disp: 90 capsule, Rfl: 3 .  IBUPROFEN PO, Take by mouth., Disp: , Rfl:  .  loperamide (IMODIUM) 2 MG capsule, Take 2 mg by mouth as needed. , Disp: , Rfl:  .  metroNIDAZOLE (METROGEL) 0.75 % gel, Apply 1 application topically at bedtime. Applies to face for rosacea, Disp: , Rfl:  .  OVER THE COUNTER MEDICATION, Nature made  Calcium mag zinc-2 daily, Disp: , Rfl:  .  triamcinolone (NASACORT ALLERGY 24HR) 55 MCG/ACT AERO nasal inhaler, Place 2 sprays into the nose as needed., Disp: , Rfl:  .  Wheat Dextrin (BENEFIBER DRINK MIX PO), Take 1 each by mouth 2 (two) times daily., Disp: , Rfl:  .  fluconazole (DIFLUCAN) 150 MG tablet, Take one today, may repeat in 3 days if needed., Disp: 2 tablet, Rfl: 0 .  rosuvastatin (CRESTOR) 5 MG tablet, Take 1 tablet (5 mg total) by mouth daily., Disp: 90 tablet, Rfl: 1  Current Facility-Administered Medications:  .  0.9 %  sodium chloride infusion, 500 mL, Intravenous, Once, Ladene Artist, MD  EXAM:  Vitals:   12/03/18 1030  BP: 120/70  Pulse: 74  Temp: 98.1 F (36.7 C)  SpO2: 95%    Body mass index is 29.78 kg/m.  GENERAL: vitals reviewed and listed above, alert, oriented, appears well hydrated and in no acute distress  HEENT: atraumatic, conjunttiva clear, no obvious abnormalities on inspection of external nose and ears  NECK: no obvious masses on inspection  LUNGS: clear to auscultation bilaterally, no wheezes, rales or rhonchi, good air movement  CV: HRRR, no peripheral edema  MS: moves all extremities without noticeable abnormality  PSYCH: pleasant and cooperative, no obvious depression or anxiety  ASSESSMENT AND PLAN:  Discussed the following assessment and plan:  Controlled type 2 diabetes mellitus with complication, without long-term current use of insulin ([place]) - Plan: Basic metabolic panel, Hemoglobin A1c  Hyperlipidemia associated with type 2 diabetes mellitus (HCC)  Yeast vaginitis  Overweight (BMI 25.0-29.9)  -lifestyle recs discussed at length and she is working on low sugar diet and discussed options for gentle exercise -discussed implications, management, risks with diabetes -agreeable to crestor a few days per week -check hgba1c today -diflucan for yeast, advised follow up if persists -follow up 3 months -Patient advised to  return or notify a doctor immediately if symptoms worsen or persist or new concerns arise.  Patient Instructions  BEFORE YOU LEAVE: -labs -follow up: 3-4 months  START the crestor and take 2-7 days per week.  Can use the Diflucan for the yeast and follow up if persists.  Healthy low sugar diet. Try to start some gentle exercise.  We have ordered labs or studies at this visit. It can take up to 1-2 weeks for results and processing. IF results require follow up or explanation, we will call you with instructions. Clinically stable results will be released to your [facility]. If you have not heard from [place] or cannot find your results in [facility] in 2 weeks please contact our office at [phone].  If you are not yet signed up for [facility], please consider signing  up.           Lucretia Kern, DO

## 2018-12-03 ENCOUNTER — Ambulatory Visit (INDEPENDENT_AMBULATORY_CARE_PROVIDER_SITE_OTHER): Payer: Medicare Other | Admitting: Family Medicine

## 2018-12-03 ENCOUNTER — Encounter: Payer: Self-pay | Admitting: Family Medicine

## 2018-12-03 VITALS — BP 120/70 | HR 74 | Temp 98.1°F | Ht <= 58 in | Wt 142.5 lb

## 2018-12-03 DIAGNOSIS — E663 Overweight: Secondary | ICD-10-CM | POA: Diagnosis not present

## 2018-12-03 DIAGNOSIS — B373 Candidiasis of vulva and vagina: Secondary | ICD-10-CM

## 2018-12-03 DIAGNOSIS — E118 Type 2 diabetes mellitus with unspecified complications: Secondary | ICD-10-CM

## 2018-12-03 DIAGNOSIS — E1169 Type 2 diabetes mellitus with other specified complication: Secondary | ICD-10-CM

## 2018-12-03 DIAGNOSIS — E785 Hyperlipidemia, unspecified: Secondary | ICD-10-CM

## 2018-12-03 DIAGNOSIS — B3731 Acute candidiasis of vulva and vagina: Secondary | ICD-10-CM

## 2018-12-03 LAB — BASIC METABOLIC PANEL
BUN: 15 mg/dL (ref 6–23)
CO2: 27 meq/L (ref 19–32)
Calcium: 10 mg/dL (ref 8.4–10.5)
Chloride: 102 mEq/L (ref 96–112)
Creatinine, Ser: 0.7 mg/dL (ref 0.40–1.20)
GFR: 80.9 mL/min (ref >60.00)
Glucose, Bld: 93 mg/dL (ref 70–99)
POTASSIUM: 4.6 meq/L (ref 3.5–5.1)
SODIUM: 139 meq/L (ref 135–145)

## 2018-12-03 LAB — HEMOGLOBIN A1C: Hgb A1c MFr Bld: 6.5 % (ref 4.6–6.5)

## 2018-12-03 MED ORDER — FLUCONAZOLE 150 MG PO TABS: ORAL_TABLET | ORAL | 0 refills | Status: DC

## 2018-12-03 MED ORDER — ROSUVASTATIN CALCIUM 5 MG PO TABS: 5.0000 mg | ORAL_TABLET | Freq: Every day | ORAL | 1 refills | Status: DC

## 2018-12-03 NOTE — Patient Instructions (Signed)
BEFORE YOU LEAVE: -labs -follow up: 3-4 months  START the crestor and take 2-7 days per week.  Can use the Diflucan for the yeast and follow up if persists.  Healthy low sugar diet. Try to start some gentle exercise.  We have ordered labs or studies at this visit. It can take up to 1-2 weeks for results and processing. IF results require follow up or explanation, we will call you with instructions. Clinically stable results will be released to your [facility]. If you have not heard from [place] or cannot find your results in [facility] in 2 weeks please contact our office at [phone].  If you are not yet signed up for [facility], please consider signing up.

## 2019-01-03 ENCOUNTER — Other Ambulatory Visit: Payer: Self-pay | Admitting: Gastroenterology

## 2019-02-03 ENCOUNTER — Other Ambulatory Visit: Payer: Self-pay

## 2019-02-03 MED ORDER — DILTIAZEM HCL ER COATED BEADS 120 MG PO CP24: 120.0000 mg | ORAL_CAPSULE | Freq: Every day | ORAL | 3 refills | Status: DC

## 2019-03-04 ENCOUNTER — Ambulatory Visit: Payer: Medicare Other | Admitting: Family Medicine

## 2019-03-05 ENCOUNTER — Other Ambulatory Visit: Payer: Self-pay

## 2019-03-05 ENCOUNTER — Ambulatory Visit (INDEPENDENT_AMBULATORY_CARE_PROVIDER_SITE_OTHER): Payer: Medicare Other | Admitting: Family Medicine

## 2019-03-05 ENCOUNTER — Telehealth: Payer: Self-pay | Admitting: *Deleted

## 2019-03-05 DIAGNOSIS — B3731 Acute candidiasis of vulva and vagina: Secondary | ICD-10-CM | POA: Insufficient documentation

## 2019-03-05 DIAGNOSIS — I480 Paroxysmal atrial fibrillation: Secondary | ICD-10-CM | POA: Diagnosis not present

## 2019-03-05 DIAGNOSIS — Z8719 Personal history of other diseases of the digestive system: Secondary | ICD-10-CM

## 2019-03-05 DIAGNOSIS — I700 Atherosclerosis of aorta: Secondary | ICD-10-CM | POA: Diagnosis not present

## 2019-03-05 DIAGNOSIS — E119 Type 2 diabetes mellitus without complications: Secondary | ICD-10-CM | POA: Diagnosis not present

## 2019-03-05 DIAGNOSIS — M545 Low back pain, unspecified: Secondary | ICD-10-CM | POA: Insufficient documentation

## 2019-03-05 DIAGNOSIS — B373 Candidiasis of vulva and vagina: Secondary | ICD-10-CM | POA: Diagnosis not present

## 2019-03-05 DIAGNOSIS — G8929 Other chronic pain: Secondary | ICD-10-CM

## 2019-03-05 MED ORDER — HYDROCODONE-ACETAMINOPHEN 5-325 MG PO TABS: 0.5000 | ORAL_TABLET | Freq: Every day | ORAL | 0 refills | Status: DC | PRN
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
## 2019-03-05 NOTE — Telephone Encounter (Signed)
Noted  

## 2019-03-05 NOTE — Telephone Encounter (Signed)
Perfect. Thanks.

## 2019-03-05 NOTE — Telephone Encounter (Signed)
I called the pt and scheduled a follow up visit. I also advised her per Hessie Diener at the [place] office they are accepting new patients and she can call [phone] and she agreed. Patient states she forgot to let Dr Ethlyn Gallery know she had a cholecystectomy and has had IBD for 10 years, which is different from the discussion during her visit this AM.

## 2019-03-05 NOTE — Telephone Encounter (Signed)
-----   Message from Caren Macadam, MD sent at 03/05/2019 11:37 AM EDT ----- Schedule follow up visit 1 month for back pain

## 2019-04-09 ENCOUNTER — Ambulatory Visit (INDEPENDENT_AMBULATORY_CARE_PROVIDER_SITE_OTHER): Payer: Medicare Other | Admitting: Family Medicine

## 2019-04-09 ENCOUNTER — Other Ambulatory Visit: Payer: Self-pay

## 2019-04-09 DIAGNOSIS — R351 Nocturia: Secondary | ICD-10-CM | POA: Diagnosis not present

## 2019-04-09 DIAGNOSIS — G8929 Other chronic pain: Secondary | ICD-10-CM | POA: Diagnosis not present

## 2019-04-09 DIAGNOSIS — N3000 Acute cystitis without hematuria: Secondary | ICD-10-CM | POA: Diagnosis not present

## 2019-04-09 DIAGNOSIS — M545 Low back pain: Secondary | ICD-10-CM | POA: Diagnosis not present

## 2019-04-09 MED ORDER — FLUCONAZOLE 150 MG PO TABS: ORAL_TABLET | ORAL | 1 refills | Status: DC

## 2019-04-09 NOTE — Progress Notes (Signed)
Virtual Visit via Telephone Note  I connected with Angel Holland on 04/09/19 at 11:30 AM EDT by telephone and verified that I am speaking with the correct person using two identifiers.   I discussed the limitations, risks, security and privacy concerns of performing an evaluation and management service by telephone and the availability of in person appointments. I also discussed with the patient that there may be a patient responsible charge related to this service. The patient expressed understanding and agreed to proceed.  Location patient: home Location provider: work office Participants present for the call: patient, provider Patient did not have a visit in the prior 7 days to address this/these issue(s).   History of Present Illness: Last visit we discussed chronic back pain. Due to severity of pain, esp at night, we decided to try low dose of hydrocodone.   Has had a bad time since our last conversation. Drank too much ice tea, coffee, carbonated drinks and got bladder infection. Called [employer] nurse and told her to drink cranberry and follow up with [place], but then yesterday had to have emergency tooth surgery - abscess was present. Did complete surgery yesterday morning. She is on abx now - penicillin which she thinks is helping tooth and bladder infection. No fevers during this time. pcn 1 tab PO QID (500mg ), fluconazole given for yeast infection 150mg  1 today and second  Took pain medication last night - needed it for jaw/tooth. Today feeling better. Able to eat today.   Was having more flank pain. Urine clear, no blood. But back pain improved with cranberry. Just having more pressure with urination. This is better.   Has done well with pain medication for her chronic back pain. She cuts in half at bedtime and keeps her from crying with position changes. Getting up every hour to hour and a half to urinate. Doesn't go that often during day - usually 3-4 times. Sometimes can't get up in time to  go. Doesn't drink anything after 6pm. Has eliminated caffeine since talking with [place].  Occasionally will have nighttime incontinence.  Usually does not have daytime incontinence unless significant cough or sneeze.  Doesn't need refill of hydrocodone since she has been cutting them in half.      Observations/Objective: Patient sounds cheerful and well on the phone. I do not appreciate any SOB. Speech and thought processing are grossly intact. Patient reported vitals:  Assessment and Plan: 1. Acute cystitis without hematuria Improving on antibiotic let us know if any worsening of symptoms.  Suggested that we get UA and culture after she completes antibiotic to ensure that infection has cleared.  She has issues with urinary frequency at night and if UA and culture are normal after antibiotic treatment, would consider antispasmodic for bladder. - Urinalysis with Reflex Microscopic; Future - Culture, Urine; Future  2. Chronic midline low back pain without sciatica Significant improvement in pain overnight with half a tablet of hydrocodone.  Continue this as needed to help with low back pain at bedtime.  Follow Up Instructions:  Return recheck urine after abx.  Advised to hydrate with water cranberry juice as this may irritate bladder.  Let us know if any worsening of symptoms prior to recheck of urine.     I did not refer this patient for an OV in the next 24 hours for this/these issue(s).  I discussed the assessment and treatment plan with the patient. The patient was provided an opportunity to ask questions and all were answered.  The patient agreed with the plan and demonstrated an understanding of the instructions.   The patient was advised to call back or seek an in-person evaluation if the symptoms worsen or if the condition fails to improve as anticipated.  I provided 23 minutes of non-face-to-face time during this encounter.   Micheline Rough, MD

## 2019-04-10 ENCOUNTER — Telehealth: Payer: Self-pay | Admitting: *Deleted

## 2019-04-10 NOTE — Telephone Encounter (Signed)
I called the pt and scheduled a lab appt for 6/17.

## 2019-04-10 NOTE — Telephone Encounter (Signed)
-----   Message from Caren Macadam, MD sent at 04/09/2019  5:47 PM EDT ----- Please call to schedule ua/culture for her after she completes current antibiotic.

## 2019-04-16 ENCOUNTER — Other Ambulatory Visit (INDEPENDENT_AMBULATORY_CARE_PROVIDER_SITE_OTHER): Payer: Medicare Other

## 2019-04-16 ENCOUNTER — Other Ambulatory Visit: Payer: Self-pay

## 2019-04-16 DIAGNOSIS — N3000 Acute cystitis without hematuria: Secondary | ICD-10-CM | POA: Diagnosis not present

## 2019-04-16 LAB — URINALYSIS, ROUTINE W REFLEX MICROSCOPIC
Bilirubin Urine: NEGATIVE
Hgb urine dipstick: NEGATIVE
Ketones, ur: NEGATIVE
Leukocytes,Ua: NEGATIVE
Nitrite: NEGATIVE
RBC / HPF: NONE SEEN (ref 0–?)
Specific Gravity, Urine: 1.025 (ref 1.000–1.030)
Total Protein, Urine: NEGATIVE
Urine Glucose: NEGATIVE
Urobilinogen, UA: 0.2 (ref 0.0–1.0)
pH: 5.5 (ref 5.0–8.0)

## 2019-04-18 LAB — URINE CULTURE
MICRO NUMBER:: 579330
SPECIMEN QUALITY:: ADEQUATE

## 2019-05-13 ENCOUNTER — Other Ambulatory Visit: Payer: Self-pay | Admitting: Gastroenterology

## 2019-05-27 ENCOUNTER — Telehealth: Payer: Self-pay | Admitting: Cardiovascular Disease

## 2019-05-27 ENCOUNTER — Telehealth: Payer: Self-pay

## 2019-05-27 ENCOUNTER — Telehealth (INDEPENDENT_AMBULATORY_CARE_PROVIDER_SITE_OTHER): Payer: Medicare Other | Admitting: Cardiovascular Disease

## 2019-05-27 DIAGNOSIS — R002 Palpitations: Secondary | ICD-10-CM

## 2019-05-27 DIAGNOSIS — I480 Paroxysmal atrial fibrillation: Secondary | ICD-10-CM

## 2019-05-27 MED ORDER — DILTIAZEM HCL ER COATED BEADS 120 MG PO CP24: 120.0000 mg | ORAL_CAPSULE | Freq: Every day | ORAL | 3 refills | Status: DC

## 2019-05-27 NOTE — Addendum Note (Signed)
Addended by: Annita Brod on: 05/27/2019 02:45 PM   Modules accepted: Orders

## 2019-05-27 NOTE — Telephone Encounter (Signed)

## 2019-05-27 NOTE — Progress Notes (Signed)
Virtual Visit via Telephone Note   This visit type was conducted due to national recommendations for restrictions regarding the COVID-19 Pandemic (e.g. social distancing) in an effort to limit this patient's exposure and mitigate transmission in our community.  Due to her co-morbid illnesses, this patient is at least at moderate risk for complications without adequate follow up.  This format is felt to be most appropriate for this patient at this time.  The patient did not have access to video technology/had technical difficulties with video requiring transitioning to audio format only (telephone).  All issues noted in this document were discussed and addressed.  No physical exam could be performed with this format.  Please refer to the patient's chart for her  consent to telehealth for [facility].   Date:  05/27/2019   ID:  Angel Holland, DOB May 12, 1941, MRN [id number]  Patient Location: Home Provider Location: Home  PCP:  Lucretia Kern, DO  Cardiologist: Dr. Quay Burow Electrophysiologist:  None   Evaluation Performed:  Follow-Up Visit  Chief Complaint: Palpitations  History of Present Illness:    Angel Holland is a 78 y.o.  mildly overweight married Caucasian female mother of 2 sons, grandmother 4 grandchildren referred by Dr. Colin Benton for cardiac evaluation because of new onset palpitations.  I last saw her in the office 05/28/2018. Risk factors include 50-pack-years of tobacco abuse having quit 10 years ago. 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 She's never had a heart attack or stroke. She had colonoscopy a month ago had an episode chest pain at that time. Since then she's noticed nocturnal palpitations.   She had a 2D echo performed 02/05/2018 which was entirely normal and a subsequent event monitor did show evidence of PAF.  She did see Almyra Deforest PAC back in the office subsequent to my visit to review the results of her event monitor, began her low-dose  diltiazem and discussed oral anticoagulation which she declined to take.  Since I saw her a year ago she did see Kerin Ransom back 11/19/2018.  Since she stopped taking the Tylenol arthritis preparation her palpitations have resolved and she has had no recurrent episodes.  She denies chest pain or shortness of breath.  The patient does not have symptoms concerning for COVID-19 infection (fever, chills, cough, or new shortness of breath).    Past Medical History:  Diagnosis Date  . Anxiety    no meds  . Arthritis    back and fingers   . Bruxism   . Cataract    baby cataracts per pt.  . Colon polyps   . DDD (degenerative disc disease), lumbar   . DDD (degenerative disc disease), lumbosacral   . Depression    no meds  . Diverticulosis of colon (without mention of hemorrhage)   . Fibroid uterus   . Headache, paroxysmal hemicrania, episodic 03/24/2014  . Hemorrhoids   . History of D&C    x2  . History of night sweats   . History of rectal bleeding    08/2017  . Hx of diverticulitis of colon 03/30/2014  . Hyperglycemia   . Irritable bowel syndrome   . PAF (paroxysmal atrial fibrillation) ([place]) 01/2018   pt declined [place]  . Pulmonary nodules - reports followed by pulmonology, reports following up with them in June 2015 03/26/2013   Repeat CT chest 04/20/14 > 1. Scattered pulmonary nodules measure 4 mm or less in size, are unchanged from 03/28/2013 and are  therefore considered benign    . Tobacco abuse   . Unspecified hemorrhoids without mention of complication    Past Surgical History:  Procedure Laterality Date  . APPENDECTOMY  1957  . bce    . CHOLECYSTECTOMY  2009  . DILATION AND CURETTAGE OF UTERUS    . fibroid removed from uterus    . HYSTEROSCOPY W/D&C N/A 09/09/2015   Procedure: DILATATION AND CURETTAGE /HYSTEROSCOPY;  Surgeon: Vanessa Kick, MD;  Location: [facility];  Service: Gynecology;  Laterality: N/A;     No outpatient medications have been marked as taking for the 05/27/19  encounter (Appointment) with Lorretta Harp, MD.   Current Facility-Administered Medications for the 05/27/19 encounter (Appointment) with Lorretta Harp, MD  Medication  . 0.9 %  sodium chloride infusion     Allergies:   Biaxin [clarithromycin], Codeine, Decongestant [pseudoephedrine hcl er], Phenylephrine, and Rosuvastatin   Social History   Tobacco Use  . Smoking status: Former Smoker    Packs/day: 1.00    Years: 50.00    Pack years: 50.00    Types: Cigarettes    Quit date: 07/23/2008    Years since quitting: 10.8  . Smokeless tobacco: Never Used  Substance Use Topics  . Alcohol use: No  . Drug use: No     Family Hx: The patient's family history includes Bone cancer in her father; Diverticulosis in her mother; Heart disease in her mother. There is no history of Colon cancer.  ROS:   Please see the history of present illness.     All other systems reviewed and are negative.   Prior CV studies:   The following studies were reviewed today:    Labs/Other Tests and Data Reviewed:    EKG:  No ECG reviewed.  Recent Labs: 07/22/2018: Hemoglobin 13.8; Platelets 309.0 12/03/2018: BUN 15; Creatinine, Ser 0.70; Potassium 4.6; Sodium 139   Recent Lipid Panel Lab Results  Component Value Date/Time   CHOL 146 07/22/2018 09:03 AM   TRIG 93.0 07/22/2018 09:03 AM   HDL 37.00 (L) 07/22/2018 09:03 AM   CHOLHDL 4 07/22/2018 09:03 AM   LDLCALC 91 07/22/2018 09:03 AM   LDLDIRECT 145.5 09/19/2007 12:00 AM    Wt Readings from Last 3 Encounters:  12/03/18 142 lb 8 oz (64.6 kg)  11/26/18 142 lb (64.4 kg)  10/31/18 141 lb (64 kg)     Objective:    Vital Signs:  There were no vitals taken for this visit.   VITAL SIGNS:  reviewed a complete physical exam was not performed today since this was a virtual telemedicine phone visit.  ASSESSMENT & PLAN:    1. Palpitations- history of palpitations in the past with an event monitor that showed an episode of PAF.  She was placed  on diltiazem.  She was offered oral anticoagulation but declined.  A 2D echo was normal.  Since stopping her Tylenol arthritis preparation she is had no recurrent episodes.  COVID-19 Education: The signs and symptoms of COVID-19 were discussed with the patient and how to seek care for testing (follow up with PCP or arrange E-visit).  The importance of social distancing was discussed today.  Time:   Today, I have spent 6 minutes with the patient with telehealth technology discussing the above problems.     Medication Adjustments/Labs and Tests Ordered: Current medicines are reviewed at length with the patient today.  Concerns regarding medicines are outlined above.   Tests Ordered: No orders of the defined types were placed  in this encounter.   Medication Changes: No orders of the defined types were placed in this encounter.   Follow Up:  In Person prn  Signed, Quay Burow, MD  05/27/2019 1:30 PM    [place]

## 2019-05-27 NOTE — Telephone Encounter (Signed)
Patient and/or DPR-approved person aware of 7/28 AVS instructions and verbalized understanding.  Letter including After Visit Summary and any other necessary documents to be mailed to the patient's address on file.

## 2019-05-27 NOTE — Patient Instructions (Signed)
Medication Instructions:  Your physician recommends that you continue on your current medications as directed. Please refer to the Current Medication list given to you today.  Your diltiazem 120 mg has been refilled. If you need a refill on your cardiac medications before your next appointment, please call your pharmacy.   Lab work: NONE If you have labs (blood work) drawn today and your tests are completely normal, you will receive your results only by: Marland Kitchen MyChart Message (if you have MyChart) OR . A paper copy in the mail If you have any lab test that is abnormal or we need to change your treatment, we will call you to review the results.  Testing/Procedures: NONE  Follow-Up: At [facility], you and your health needs are our priority.  As part of our continuing mission to provide you with exceptional heart care, we have created designated Provider Care Teams.  These Care Teams include your primary Cardiologist (physician) and Advanced Practice Providers (APPs -  Physician Assistants and Nurse Practitioners) who all work together to provide you with the care you need, when you need it. . You may schedule a follow up appointment AS NEEDED. You may see Dr. Gwenlyn Found or one of the following Advanced Practice Providers on your designated Care Team:   . Kerin Ransom, [place] . Almyra Deforest, PA-C . Fabian Sharp, PA-C . Jory Sims, DNP . Rosaria Ferries, PA-C . Roby Lofts, PA-C

## 2019-05-27 NOTE — Telephone Encounter (Signed)
° ° °  Virtual Visit Pre-Appointment Phone Call  "(Name), I am calling you today to discuss your upcoming appointment. We are currently trying to limit exposure to the virus that causes COVID-19 by seeing patients at home rather than in the office."   1. Confirm consent - "In the setting of the current Covid19 crisis, you are scheduled for a (phone or video) visit with your provider on (date) at (time).  Just as we do with many in-office visits, in order for you to participate in this visit, we must obtain consent.  If you'd like, I can send this to your mychart (if signed up) or email for you to review.  Otherwise, I can obtain your verbal consent now.  All virtual visits are billed to your insurance company just like a normal visit would be.  By agreeing to a virtual visit, we'd like you to understand that the technology does not allow for your provider to perform an examination, and thus may limit your provider's ability to fully assess your condition. If your provider identifies any concerns that need to be evaluated in person, we will make arrangements to do so.  Finally, though the technology is pretty good, we cannot assure that it will always work on either your or our end, and in the setting of a video visit, we may have to convert it to a phone-only visit.  In either situation, we cannot ensure that we have a secure connection.  Are you willing to proceed?" STAFF: Did the patient verbally acknowledge consent to telehealth visit? Document YES/NO here: Yes 2.   I give my informed consent for the services to be provided through the use of telemedicine in my medical care  By participating in this telemedicine visit I agree to the above.

## 2019-05-28 ENCOUNTER — Telehealth: Payer: Medicare Other | Admitting: Cardiovascular Disease

## 2019-07-02 ENCOUNTER — Telehealth: Payer: Self-pay | Admitting: *Deleted

## 2019-07-02 NOTE — Telephone Encounter (Signed)
Just make sure that there are no skin changes - redness, warmth, swelling to breast and no fevers/chills. Make sure pain is just in breast and not in chest. If so, then would be ok for diagnostic mammogram/US to be ordered for that left breast. Can do screening mamm for right breast

## 2019-07-02 NOTE — Telephone Encounter (Signed)
Unable to speak with pt as message received stating calls are being screened and she is not accepting a call from this number-will attempt to call back.

## 2019-07-02 NOTE — Telephone Encounter (Signed)
Copied from [employer] [id number]. Topic: General - Other >> Jul 02, 2019  9:17 AM Leward Quan A wrote: Reason for CRM: Patient called to say that she is having pain in her left breast and contacted [facility] for her mammogram is requesting a mammogram referral for a diagnostic with breast ultrasound to be faxed to fax# [phone]. Patient is scheduled on 07/15/2019 at 1 pm and referral is needed in office before she can be seen Please advise Ph# [phone] >> Jul 02, 2019  9:56 AM Cox, Melburn Hake, CMA wrote: Does pt need to be seen for breast pain?

## 2019-07-03 NOTE — Telephone Encounter (Signed)
Unable to speak with pt as same message received in which pt is not accepting calls.  I called [facility] and spoke with Apolonio Schneiders and she stated there is a note stating the pt called with pain.  Order completed and faxed to the number below and Apolonio Schneiders stated she will enter a notation to have the pt call our office.

## 2019-07-15 ENCOUNTER — Encounter: Payer: Self-pay | Admitting: Family Medicine

## 2019-07-15 DIAGNOSIS — R921 Mammographic calcification found on diagnostic imaging of breast: Secondary | ICD-10-CM | POA: Diagnosis not present

## 2019-07-15 DIAGNOSIS — N644 Mastodynia: Secondary | ICD-10-CM | POA: Diagnosis not present

## 2019-07-15 LAB — HM MAMMOGRAPHY

## 2019-07-21 ENCOUNTER — Other Ambulatory Visit: Payer: Self-pay | Admitting: Gastroenterology

## 2019-07-23 ENCOUNTER — Other Ambulatory Visit: Payer: Self-pay | Admitting: Gastroenterology

## 2019-07-24 ENCOUNTER — Telehealth: Payer: Self-pay | Admitting: Gastroenterology

## 2019-07-24 NOTE — Telephone Encounter (Signed)
Prescription sent to patient's pharmacy. Patient notified to keep appt for any further refills. Patient verbalized understanding.

## 2019-07-25 ENCOUNTER — Other Ambulatory Visit: Payer: Self-pay

## 2019-07-25 ENCOUNTER — Ambulatory Visit (INDEPENDENT_AMBULATORY_CARE_PROVIDER_SITE_OTHER): Payer: Medicare Other | Admitting: Family Medicine

## 2019-07-25 DIAGNOSIS — Z8719 Personal history of other diseases of the digestive system: Secondary | ICD-10-CM

## 2019-07-25 DIAGNOSIS — E119 Type 2 diabetes mellitus without complications: Secondary | ICD-10-CM

## 2019-07-25 DIAGNOSIS — E785 Hyperlipidemia, unspecified: Secondary | ICD-10-CM

## 2019-07-25 DIAGNOSIS — M545 Low back pain: Secondary | ICD-10-CM

## 2019-07-25 DIAGNOSIS — I700 Atherosclerosis of aorta: Secondary | ICD-10-CM | POA: Diagnosis not present

## 2019-07-25 DIAGNOSIS — E1169 Type 2 diabetes mellitus with other specified complication: Secondary | ICD-10-CM | POA: Diagnosis not present

## 2019-07-25 DIAGNOSIS — G8929 Other chronic pain: Secondary | ICD-10-CM

## 2019-07-25 DIAGNOSIS — I480 Paroxysmal atrial fibrillation: Secondary | ICD-10-CM

## 2019-07-25 MED ORDER — HYDROCODONE-IBUPROFEN 5-200 MG PO TABS: 1.0000 | ORAL_TABLET | Freq: Every day | ORAL | 0 refills | Status: DC | PRN

## 2019-07-25 NOTE — Progress Notes (Signed)
Virtual Visit via Video Note Unable to connect via video;   I connected with Angel Holland   on 07/25/19 at  9:00 AM EDT by a video enabled telemedicine application and verified that I am speaking with the correct person using two identifiers.  Location patient: home Location provider:work office Persons participating in the virtual visit: patient, provider  I discussed the limitations of evaluation and management by telemedicine and the availability of in person appointments. The patient expressed understanding and agreed to proceed.   Angel Holland DOB: 10-21-1941 Encounter date: 07/25/2019  This is a 78 y.o. female who presents to establish care. No chief complaint on file.   History of present illness: Having shooting pains in left breast. Started in august. Had evaluation with mammogram/us and was told this was ok.   Didn't do well with the hydrocodone because of tylenol component. Has difficulty with codeine and tylenol. Took different med from dentist with ibuprofen and did ok with this. Hydrocodone-ibuprofen. This medication really helped her back as well. Just taking a few days/week helped her sleep.   IBS: bentyl is still helping with this. Bowels have been doing better overall. Has been taking imodium sometimes because BMs are like paste. Difficult to clean off; keeps having small amounts of bowel movements with wiping. Has had some clear fluid leaking out. Has appointment to see GI in order to get a refill of the bentyl. Appointment is next month.   Chronic back pain: pain is the same all day. Has slowed down a lot with what she is doing - like not picking up sticks in yard, not lifting mattress to change sheets; doesn't pick up anything from floor. Can't vacuum/mop without needing something for pain.   Rosacea: uses metronidazole gel which helps her.   PAF, aortic atherosclerosis: follows with cardiology.   Hyperglycemia. Last A1C was 6.5 from 11/2018. Due for recheck.     Past Medical History:  Diagnosis Date  . Anxiety    no meds  . Arthritis    back and fingers   . Bruxism   . Cataract    baby cataracts per pt.  . Colon polyps   . DDD (degenerative disc disease), lumbar   . DDD (degenerative disc disease), lumbosacral   . Depression    no meds  . Diverticulosis of colon (without mention of hemorrhage)   . Fibroid uterus   . Headache, paroxysmal hemicrania, episodic 03/24/2014  . Hemorrhoids   . History of D&C    x2  . History of night sweats   . History of rectal bleeding    08/2017  . Hx of diverticulitis of colon 03/30/2014  . Hyperglycemia   . Irritable bowel syndrome   . PAF (paroxysmal atrial fibrillation) ([place]) 01/2018   pt declined [facility]  . Pulmonary nodules - reports followed by pulmonology, reports following up with them in June 2015 03/26/2013   Repeat CT chest 04/20/14 > 1. Scattered pulmonary nodules measure 4 mm or less in size, are unchanged from 03/28/2013 and are therefore considered benign    . Tobacco abuse   . Unspecified hemorrhoids without mention of complication    Past Surgical History:  Procedure Laterality Date  . APPENDECTOMY  1957  . bce    . CHOLECYSTECTOMY  2009  . DILATION AND CURETTAGE OF UTERUS    . fibroid removed from uterus    . HYSTEROSCOPY W/D&C N/A 09/09/2015   Procedure: DILATATION AND CURETTAGE /HYSTEROSCOPY;  Surgeon: Vanessa Kick, MD;  Location: [facility];  Service: Gynecology;  Laterality: N/A;   Allergies  Allergen Reactions  . Biaxin [Clarithromycin] Other (See Comments)    BREASTS INFLAMMED  . Codeine Other (See Comments)    PANIC ATTACKS, CAN'T SLEEP  . Decongestant [Pseudoephedrine Hcl Er]     Cannot take with Dilitiazem  . Phenylephrine     Cannot take with Dilitiazem  . Rosuvastatin     Nausea; headache  . Acetaminophen Other (See Comments)    Triggered her "heart problem". Referring to atrial fibrillation   No outpatient medications have been marked as taking for the 07/25/19  encounter (Office Visit) with Caren Macadam, MD.   Current Facility-Administered Medications for the 07/25/19 encounter (Office Visit) with Caren Macadam, MD  Medication  . 0.9 %  sodium chloride infusion   Social History   Tobacco Use  . Smoking status: Former Smoker    Packs/day: 1.00    Years: 50.00    Pack years: 50.00    Types: Cigarettes    Quit date: 07/23/2008    Years since quitting: 11.0  . Smokeless tobacco: Never Used  Substance Use Topics  . Alcohol use: No   Family History  Problem Relation Age of Onset  . Heart disease Mother        CHF  . Diverticulosis Mother   . Bone cancer Father        found in his mouth first  . Colon cancer Neg Hx      Review of Systems  Constitutional: Negative for chills, fatigue and fever.  Respiratory: Negative for cough, chest tightness, shortness of breath and wheezing.   Cardiovascular: Negative for chest pain, palpitations and leg swelling.    Objective:  There were no vitals taken for this visit.      BP Readings from Last 3 Encounters:  12/03/18 120/70  11/26/18 128/72  10/31/18 120/80   Wt Readings from Last 3 Encounters:  12/03/18 142 lb 8 oz (64.6 kg)  11/26/18 142 lb (64.4 kg)  10/31/18 141 lb (64 kg)    EXAM:  GENERAL: alert, oriented, appears well and in no acute distress  LUNGS: breathing is comfortable; without any difficulty with breathing over phone.   PSYCH/NEURO: pleasant and cooperative, no obvious depression or anxiety, speech and thought processing grossly intact   Assessment/Plan  1. PAF (paroxysmal atrial fibrillation) ([place]) Follows regularly with cardiology.   2. Controlled type 2 diabetes mellitus without complication, without long-term current use of insulin ([place]) We will get recheck of bloodwork and review at upcoming visit.  - CBC with Differential/Platelet; Future - Comprehensive metabolic panel; Future - Hemoglobin A1c; Future - Microalbumin / creatinine urine  ratio; Future  3. Aortic atherosclerosis ([place]) Noted on imaging historically.   4. History of IBS - followed by [facility] Has follow up scheduled. Encouraged to discuss all of her stool concerns with them as diarrhea is impacting quality of life.  5. Chronic midline low back pain without sciatica Stable; does well with occasional pain medication for back flares. Filled vicoprofen which she tolerates. She is aware of risks of pain medication and ibuprofen and will use sparingly.  6. Hyperlipidemia associated with type 2 diabetes mellitus ([place]) - Lipid panel; Future    Return for has physical scheduled, bloodwork as well.   I discussed the assessment and treatment plan with the patient. The patient was provided an opportunity to ask questions and all were answered. The patient agreed with the plan and demonstrated an  understanding of the instructions.   The patient was advised to call back or seek an in-person evaluation if the symptoms worsen or if the condition fails to improve as anticipated.  I provided 23 minutes of non-face-to-face time during this encounter.   Micheline Rough, MD

## 2019-07-28 MED ORDER — DICYCLOMINE HCL 10 MG PO CAPS: ORAL_CAPSULE | ORAL | 0 refills | Status: DC

## 2019-07-28 NOTE — Addendum Note (Signed)
Addended by: Marzella Schlein on: 07/28/2019 09:47 AM   Modules accepted: Orders

## 2019-07-28 NOTE — Telephone Encounter (Signed)
Pt stated that pharmacy never received dicylclomine prescription.  Please resent to [place], [place]

## 2019-07-30 ENCOUNTER — Encounter: Payer: Self-pay | Admitting: Family Medicine

## 2019-07-31 ENCOUNTER — Other Ambulatory Visit: Payer: Self-pay

## 2019-07-31 ENCOUNTER — Ambulatory Visit: Payer: Medicare Other

## 2019-07-31 ENCOUNTER — Other Ambulatory Visit (INDEPENDENT_AMBULATORY_CARE_PROVIDER_SITE_OTHER): Payer: Medicare Other

## 2019-07-31 DIAGNOSIS — E785 Hyperlipidemia, unspecified: Secondary | ICD-10-CM | POA: Diagnosis not present

## 2019-07-31 DIAGNOSIS — E119 Type 2 diabetes mellitus without complications: Secondary | ICD-10-CM

## 2019-07-31 DIAGNOSIS — E1169 Type 2 diabetes mellitus with other specified complication: Secondary | ICD-10-CM | POA: Diagnosis not present

## 2019-07-31 DIAGNOSIS — Z23 Encounter for immunization: Secondary | ICD-10-CM | POA: Diagnosis not present

## 2019-07-31 LAB — COMPREHENSIVE METABOLIC PANEL
ALT: 11 U/L (ref 0–35)
AST: 11 U/L (ref 0–37)
Albumin: 4.4 g/dL (ref 3.5–5.2)
Alkaline Phosphatase: 67 U/L (ref 39–117)
BUN: 17 mg/dL (ref 6–23)
CO2: 27 mEq/L (ref 19–32)
Calcium: 9.8 mg/dL (ref 8.4–10.5)
Chloride: 103 mEq/L (ref 96–112)
Creatinine, Ser: 0.78 mg/dL (ref 0.40–1.20)
GFR: 71.28 mL/min (ref >60.00)
Glucose, Bld: 105 mg/dL — ABNORMAL HIGH (ref 70–99)
Potassium: 4.6 mEq/L (ref 3.5–5.1)
Sodium: 139 mEq/L (ref 135–145)
Total Bilirubin: 0.5 mg/dL (ref 0.2–1.2)
Total Protein: 7.1 g/dL (ref 6.0–8.3)

## 2019-07-31 LAB — CBC WITH DIFFERENTIAL/PLATELET
Basophils Absolute: 0.1 10*3/uL (ref 0.0–0.1)
Basophils Relative: 0.8 % (ref 0.0–3.0)
Eosinophils Absolute: 0 10*3/uL (ref 0.0–0.7)
Eosinophils Relative: 0.5 % (ref 0.0–5.0)
HCT: 39.2 % (ref 36.0–46.0)
Hemoglobin: 12.9 g/dL (ref 12.0–15.0)
Lymphocytes Relative: 22.3 % (ref 12.0–46.0)
Lymphs Abs: 1.8 10*3/uL (ref 0.7–4.0)
MCHC: 32.9 g/dL (ref 30.0–36.0)
MCV: 86.1 fl (ref 78.0–100.0)
Monocytes Absolute: 0.6 10*3/uL (ref 0.1–1.0)
Monocytes Relative: 7.2 % (ref 3.0–12.0)
Neutro Abs: 5.6 10*3/uL (ref 1.4–7.7)
Neutrophils Relative %: 69.2 % (ref 43.0–77.0)
Platelets: 288 10*3/uL (ref 150.0–400.0)
RBC: 4.55 Mil/uL (ref 3.87–5.11)
RDW: 15 % (ref 11.5–15.5)
WBC: 8.1 10*3/uL (ref 4.0–10.5)

## 2019-07-31 LAB — LIPID PANEL
Cholesterol: 142 mg/dL (ref 0–200)
HDL: 40.2 mg/dL (ref >39.00)
LDL Cholesterol: 85 mg/dL (ref 0–99)
NonHDL: 102.28
Total CHOL/HDL Ratio: 4
Triglycerides: 85 mg/dL (ref 0.0–149.0)
VLDL: 17 mg/dL (ref 0.0–40.0)

## 2019-07-31 LAB — MICROALBUMIN / CREATININE URINE RATIO
Creatinine,U: 130.5 mg/dL
Microalb Creat Ratio: 1.2 mg/g (ref 0.0–30.0)
Microalb, Ur: 1.6 mg/dL (ref 0.0–1.9)

## 2019-07-31 LAB — HEMOGLOBIN A1C: Hgb A1c MFr Bld: 6.8 % — ABNORMAL HIGH (ref 4.6–6.5)

## 2019-08-11 ENCOUNTER — Encounter: Payer: Self-pay | Admitting: Family Medicine

## 2019-08-11 ENCOUNTER — Ambulatory Visit (INDEPENDENT_AMBULATORY_CARE_PROVIDER_SITE_OTHER): Payer: Medicare Other | Admitting: Family Medicine

## 2019-08-11 ENCOUNTER — Other Ambulatory Visit: Payer: Self-pay

## 2019-08-11 VITALS — BP 116/68 | HR 75 | Temp 97.8°F | Ht 59.0 in | Wt 133.3 lb

## 2019-08-11 DIAGNOSIS — M545 Low back pain, unspecified: Secondary | ICD-10-CM

## 2019-08-11 DIAGNOSIS — I480 Paroxysmal atrial fibrillation: Secondary | ICD-10-CM

## 2019-08-11 DIAGNOSIS — E119 Type 2 diabetes mellitus without complications: Secondary | ICD-10-CM

## 2019-08-11 DIAGNOSIS — G8929 Other chronic pain: Secondary | ICD-10-CM | POA: Diagnosis not present

## 2019-08-11 NOTE — Progress Notes (Signed)
Angel Holland DOB: 1941-02-17 Encounter date: 08/11/2019  This is a 78 y.o. female who presents with Chief Complaint  Patient presents with  . Annual Exam    History of present illness: Back pain is still severe. The hydrocodone/ibuprofen gives her great relief at night. She is limited with activity; can't bend over and get sticks in yard, can't bowl, can't walk dog. She is still mopping floors, cleaning sinks and toilets.   DMII: has been eating more sugared beets and states that she thinks that this caused A1C elevation. Was put on cholesterol medication, but had terrible headache, left eye severe pain, nausea. Gets eye exams regularly through "my eye doctor".   Aortic atherosclerosis, PAF: palpitations seen on event monitor. On diltiazem. Decline doral anticoagulation (discussed and followed by cardiology). Normal 2D echol. Improved sx since stopping tylenol arthritis.  Had pain in left breast and needed to get letter for [place] - she had this completed and states that this was ok.   Last colonoscopy was 11/2017; aged out for repeat. Has always had some irritation with bowels. Tries to be careful with what she is eating. Will take anti-diarrheal pill after accident. That seems to help with future events.   Allergies  Allergen Reactions  . Biaxin [Clarithromycin] Other (See Comments)    BREASTS INFLAMMED  . Codeine Other (See Comments)    PANIC ATTACKS, CAN'T SLEEP  . Decongestant [Pseudoephedrine Hcl Er]     Cannot take with Dilitiazem  . Phenylephrine     Cannot take with Dilitiazem  . Rosuvastatin     Nausea; headache  . Acetaminophen Other (See Comments)    Triggered her "heart problem". Referring to atrial fibrillation   Current Meds  Medication Sig  . Ascorbic Acid (VITAMIN C PO) Take by mouth daily.  Marland Kitchen aspirin 81 MG tablet Take 81 mg by mouth daily.    . cholecalciferol (VITAMIN D) 1000 UNITS tablet Take 1,000 Units by mouth daily.  . Cyanocobalamin (VITAMIN B-12 PO)  Take by mouth daily.  Marland Kitchen dicyclomine (BENTYL) 10 MG capsule TAKE 1 CAPSULE BY MOUTH FOUR TIMES DAILY AS NEEDED FOR SPASMS  . diltiazem (CARDIZEM CD) 120 MG 24 hr capsule Take 1 capsule (120 mg total) by mouth daily.  . IBUPROFEN PO Take by mouth.  . loperamide (IMODIUM) 2 MG capsule Take 2 mg by mouth as needed.   . metroNIDAZOLE (METROGEL) 0.75 % gel Apply 1 application topically at bedtime. Applies to face for rosacea  . OVER THE COUNTER MEDICATION Petra Kuba made Calcium mag zinc-2 daily  . triamcinolone (NASACORT ALLERGY 24HR) 55 MCG/ACT AERO nasal inhaler Place 2 sprays into the nose as needed.  . Wheat Dextrin (BENEFIBER DRINK MIX PO) Take 1 each by mouth 2 (two) times daily.  . [DISCONTINUED] hydrocodone-ibuprofen (VICOPROFEN) 5-200 MG tablet Take 1 tablet by mouth daily as needed for pain.   Current Facility-Administered Medications for the 08/11/19 encounter (Office Visit) with Caren Macadam, MD  Medication  . 0.9 %  sodium chloride infusion    Review of Systems  Constitutional: Negative for activity change, appetite change, chills, fatigue, fever and unexpected weight change.  HENT: Negative for congestion, ear pain, hearing loss, sinus pressure, sinus pain, sore throat and trouble swallowing.   Eyes: Negative for pain and visual disturbance.  Respiratory: Negative for cough, chest tightness, shortness of breath and wheezing.   Cardiovascular: Negative for chest pain, palpitations and leg swelling.  Gastrointestinal: Negative for abdominal pain, blood in stool, constipation, diarrhea, nausea  and vomiting.  Genitourinary: Negative for difficulty urinating and menstrual problem.  Musculoskeletal: Positive for back pain and gait problem (hard to get started walking due to back). Negative for arthralgias.  Skin: Negative for rash.  Neurological: Negative for dizziness, weakness, numbness and headaches.  Hematological: Negative for adenopathy. Does not bruise/bleed easily.   Psychiatric/Behavioral: Negative for sleep disturbance and suicidal ideas. The patient is not nervous/anxious.     Objective:  BP 116/68 (BP Location: Left Arm, Patient Position: Sitting, Cuff Size: Normal)   Pulse 75   Temp 97.8 F (36.6 C) (Temporal)   Ht 4\' 11"  (1.499 m)   Wt 133 lb 4.8 oz (60.5 kg)   SpO2 97%   BMI 26.92 kg/m   Weight: 133 lb 4.8 oz (60.5 kg)   BP Readings from Last 3 Encounters:  08/11/19 116/68  12/03/18 120/70  11/26/18 128/72   Wt Readings from Last 3 Encounters:  08/11/19 133 lb 4.8 oz (60.5 kg)  12/03/18 142 lb 8 oz (64.6 kg)  11/26/18 142 lb (64.4 kg)    Physical Exam Constitutional:      General: She is not in acute distress.    Appearance: She is well-developed.  HENT:     Head: Normocephalic and atraumatic.     Right Ear: External ear normal.     Left Ear: External ear normal.     Mouth/Throat:     Pharynx: No oropharyngeal exudate.  Eyes:     Conjunctiva/sclera: Conjunctivae normal.     Pupils: Pupils are equal, round, and reactive to light.  Neck:     Musculoskeletal: Normal range of motion and neck supple.     Thyroid: No thyromegaly.  Cardiovascular:     Rate and Rhythm: Normal rate and regular rhythm.     Heart sounds: Normal heart sounds. No murmur. No friction rub. No gallop.   Pulmonary:     Effort: Pulmonary effort is normal.     Breath sounds: Normal breath sounds.  Abdominal:     General: Bowel sounds are normal. There is no distension.     Palpations: Abdomen is soft. There is no mass.     Tenderness: There is no abdominal tenderness. There is no guarding.     Hernia: No hernia is present.  Musculoskeletal: Normal range of motion.        General: No tenderness or deformity.     Comments: Difficulty with extension of back  Lymphadenopathy:     Cervical: No cervical adenopathy.  Skin:    General: Skin is warm and dry.     Findings: No rash.     Comments: Bunion left foot; but otherwise normal foot exam. Normal  monofilament sensation.   Neurological:     Mental Status: She is alert and oriented to person, place, and time.     Deep Tendon Reflexes: Reflexes normal.     Reflex Scores:      Tricep reflexes are 2+ on the right side and 2+ on the left side.      Bicep reflexes are 2+ on the right side and 2+ on the left side.      Brachioradialis reflexes are 2+ on the right side and 2+ on the left side.      Patellar reflexes are 2+ on the right side and 2+ on the left side. Psychiatric:        Speech: Speech normal.        Behavior: Behavior normal.  Thought Content: Thought content normal.     Assessment/Plan  1. Controlled type 2 diabetes mellitus without complication, without long-term current use of insulin ([facility]) Diet controlled. She will limit sugar in diet.  - Hemoglobin A1c; Future - Basic metabolic panel; Future - HM DIABETES FOOT EXAM  2. Chronic midline low back pain without sciatica Does well with evening pain medication if needed. Does not need a refill today since using sparingly.  3. PAF (paroxysmal atrial fibrillation) ([place]) Has been stable. Rate controlled on diltiazem.    Return in about 6 months (around 02/09/2020) for Chronic condition visit; bloodwork before.    Micheline Rough, MD

## 2019-08-26 ENCOUNTER — Ambulatory Visit (INDEPENDENT_AMBULATORY_CARE_PROVIDER_SITE_OTHER): Payer: Medicare Other | Admitting: Gastroenterology

## 2019-08-26 ENCOUNTER — Encounter: Payer: Self-pay | Admitting: Gastroenterology

## 2019-08-26 VITALS — BP 108/70 | HR 67 | Ht <= 58 in | Wt 131.0 lb

## 2019-08-26 DIAGNOSIS — K580 Irritable bowel syndrome with diarrhea: Secondary | ICD-10-CM

## 2019-08-26 DIAGNOSIS — K5730 Diverticulosis of large intestine without perforation or abscess without bleeding: Secondary | ICD-10-CM | POA: Diagnosis not present

## 2019-08-26 MED ORDER — DICYCLOMINE HCL 10 MG PO CAPS: ORAL_CAPSULE | ORAL | 11 refills | Status: DC

## 2019-08-26 NOTE — Patient Instructions (Signed)
We have sent the following medications to your pharmacy for you to pick up at your convenience:dicyclomine.   Thank you for choosing me and [facility].  Malcolm T. Stark, Jr., MD., FACG   

## 2019-08-26 NOTE — Progress Notes (Signed)
    History of Present Illness: This is a 78 year old female with IBS-D.  She reports frequent urgent loose stools typically every morning and occasionally in the afternoons.  Symptoms under good control with dicyclomine and Imodium. No other complaints.  CBC, CMP performed earlier this month were unremarkable.  Colonoscopy 11/2017 - Severe diverticulosis in the left colon. There was narrowing of the colon in association with the diverticular opening. There was evidence of diverticular spasm. Peri-diverticular erythema was seen. There was no evidence of diverticular bleeding. - Internal hemorrhoids. - The examination was otherwise normal on direct and retroflexion views. Random biopsies obtained which were normal.  Current Medications, Allergies, Past Medical History, Past Surgical History, Family History and Social History were reviewed in [employer] record.   Physical Exam: General: Well developed, well nourished, no acute distress Head: Normocephalic and atraumatic Eyes:  sclerae anicteric, EOMI Ears: Normal auditory acuity Mouth: No deformity or lesions Lungs: Clear throughout to auscultation Heart: Regular rate and rhythm; no murmurs, rubs or bruits Abdomen: Soft, non tender and non distended. No masses, hepatosplenomegaly or hernias noted. Normal Bowel sounds Rectal: Not done Musculoskeletal: Symmetrical with no gross deformities  Pulses:  Normal pulses noted Extremities: No clubbing, cyanosis, edema or deformities noted Neurological: Alert oriented x 4, grossly nonfocal Psychological:  Alert and cooperative. Normal mood and affect   Assessment and Recommendations:  1. IBS-D.  Continue dicyclomine 10 mg p.o. 4 times daily as needed.  Given typical morning symptoms take dicyclomine regularly at bedtime and in am. Continue Imodium 2 times daily as needed for diarrhea. REV in 1 year.   2. Severe diverticulosis.  High-fiber diet with adequate daily fluid  intake.

## 2019-09-11 DIAGNOSIS — L814 Other melanin hyperpigmentation: Secondary | ICD-10-CM | POA: Diagnosis not present

## 2019-09-11 DIAGNOSIS — L718 Other rosacea: Secondary | ICD-10-CM | POA: Diagnosis not present

## 2019-09-11 DIAGNOSIS — L821 Other seborrheic keratosis: Secondary | ICD-10-CM | POA: Diagnosis not present

## 2019-09-11 DIAGNOSIS — D1801 Hemangioma of skin and subcutaneous tissue: Secondary | ICD-10-CM | POA: Diagnosis not present

## 2019-09-11 DIAGNOSIS — I788 Other diseases of capillaries: Secondary | ICD-10-CM | POA: Diagnosis not present

## 2019-09-11 DIAGNOSIS — L720 Epidermal cyst: Secondary | ICD-10-CM | POA: Diagnosis not present

## 2019-09-11 DIAGNOSIS — Z85828 Personal history of other malignant neoplasm of skin: Secondary | ICD-10-CM | POA: Diagnosis not present

## 2019-09-11 DIAGNOSIS — L570 Actinic keratosis: Secondary | ICD-10-CM | POA: Diagnosis not present

## 2019-09-18 ENCOUNTER — Telehealth: Payer: Self-pay | Admitting: Family Medicine

## 2019-09-18 ENCOUNTER — Other Ambulatory Visit: Payer: Self-pay | Admitting: Family Medicine

## 2019-09-18 DIAGNOSIS — M545 Low back pain, unspecified: Secondary | ICD-10-CM

## 2019-09-18 DIAGNOSIS — G8929 Other chronic pain: Secondary | ICD-10-CM

## 2019-09-18 MED ORDER — HYDROCODONE-IBUPROFEN 5-200 MG PO TABS: 1.0000 | ORAL_TABLET | Freq: Every day | ORAL | 0 refills | Status: DC | PRN

## 2019-09-18 NOTE — Telephone Encounter (Signed)
Patient requesting Hydrocodone/Ibuprofen but not on current medication list. Patient states that she uses pills for sleeping

## 2019-09-18 NOTE — Telephone Encounter (Signed)
Medication Refill - Medication: hydrocodone/ibuprofen 5-200mg   Has the patient contacted their pharmacy? Yes - states she uses these as sleeping pills and needs them refilled, but the pharmacy told her they would need a new prescription from the doctor (Agent: If no, request that the patient contact the pharmacy for the refill.) (Agent: If yes, when and what did the pharmacy advise?)  Preferred Pharmacy (with phone number or street name):  [id number] - [place], [place] [phone] (Phone) [phone] (Fax)   Agent: Please be advised that RX refills may take up to 3 business days. We ask that you follow-up with your pharmacy.

## 2019-09-18 NOTE — Telephone Encounter (Signed)
done

## 2019-11-12 ENCOUNTER — Ambulatory Visit: Payer: Medicare Other

## 2019-11-24 ENCOUNTER — Telehealth: Payer: Self-pay | Admitting: Family Medicine

## 2019-11-24 ENCOUNTER — Other Ambulatory Visit: Payer: Self-pay | Admitting: Family Medicine

## 2019-11-24 DIAGNOSIS — G8929 Other chronic pain: Secondary | ICD-10-CM

## 2019-11-24 MED ORDER — HYDROCODONE-IBUPROFEN 5-200 MG PO TABS: 1.0000 | ORAL_TABLET | Freq: Every day | ORAL | 0 refills | Status: DC | PRN

## 2019-11-24 NOTE — Telephone Encounter (Signed)
Patient is requesting refill for: hydrocodone-ibuprofen (VICOPROFEN) 5-200 MG tablet   Send to:  [id number] [address], [place] Phone:  [phone]  Fax:  [phone]

## 2019-11-24 NOTE — Telephone Encounter (Signed)
refilled 

## 2019-12-01 ENCOUNTER — Ambulatory Visit (INDEPENDENT_AMBULATORY_CARE_PROVIDER_SITE_OTHER): Payer: Medicare Other

## 2019-12-01 VITALS — Ht <= 58 in | Wt 130.0 lb

## 2019-12-01 DIAGNOSIS — Z0000 Encounter for general adult medical examination without abnormal findings: Secondary | ICD-10-CM

## 2019-12-01 NOTE — Patient Instructions (Addendum)
Angel Holland , Thank you for taking time to participate in your Medicare Wellness Visit. I appreciate your ongoing commitment to your health goals. Please review the following plan we discussed and let me know if I can assist you in the future.   Screening recommendations/referrals: Colorectal Screening: colonoscopy completed 12/19/2017. No longer necessary due to age.  Mammogram: completed 07/15/2019. Due again 07/14/2020.  Bone Density: declines further testing.  Vision and Dental Exams: Recommended annual ophthalmology exams for early detection of glaucoma and other disorders of the eye Recommended annual dental exams for proper oral hygiene  Diabetic Exams: Diabetic Eye Exam: completed 04/30/2019. Diabetic Foot Exam: completed 08/11/2019.  Vaccinations: Influenza vaccine: completed 07/31/2019; due again 11/02/2024.  Pneumococcal vaccine: completed 07/24/2013 and 07/12/2015. Up to date. Tdap vaccine: completed 11/02/2014; due again 11/02/2024. Shingles vaccine: completed 09/22/2018. Patient states both shingrix were performed at [facility]. Records requested.   Advanced directives: Advance directives discussed with you today. Please bring a copy of your POA (Power of [place]) and/or Living Will to your next appointment.  Goals: Recommend to drink at least 6-8 8oz glasses of water per day.  Encourage you to get any physical exercise that you can as this will help with your chronic back pain.  Recommend to remove any items from the home that may cause slips or trips.  Next appointment:  Annual Wellness Visit with your Nurse Health Advisor is 12/01/2020 9:45am  Preventive Care 65 Years and Older, Female Preventive care refers to lifestyle choices and visits with your health care provider that can promote health and wellness. What does preventive care include?  A yearly physical exam. This is also called an annual well check.  Dental exams once or twice a  year.  Routine eye exams. Ask your health care provider how often you should have your eyes checked.  Personal lifestyle choices, including:  Daily care of your teeth and gums.  Regular physical activity.  Eating a healthy diet.  Avoiding tobacco and drug use.  Limiting alcohol use.  Practicing safe sex.  Taking low-dose aspirin every day if recommended by your health care provider.  Taking vitamin and mineral supplements as recommended by your health care provider. What happens during an annual well check? The services and screenings done by your health care provider during your annual well check will depend on your age, overall health, lifestyle risk factors, and family history of disease. Counseling  Your health care provider may ask you questions about your:  Alcohol use.  Tobacco use.  Drug use.  Emotional well-being.  Home and relationship well-being.  Sexual activity.  Eating habits.  History of falls.  Memory and ability to understand (cognition).  Work and work Statistician.  Reproductive health. Screening  You may have the following tests or measurements:  Height, weight, and BMI.  Blood pressure.  Lipid and cholesterol levels. These may be checked every 5 years, or more frequently if you are over 16 years old.  Skin check.  Lung cancer screening. You may have this screening every year starting at age 74 if you have a 30-pack-year history of smoking and currently smoke or have quit within the past 15 years.  Fecal occult blood test (FOBT) of the stool. You may have this test every year starting at age 59.  Flexible sigmoidoscopy or colonoscopy. You may have a sigmoidoscopy every 5 years or a colonoscopy every 10 years starting at age 56.  Hepatitis C blood test.  Hepatitis  B blood test.  Sexually transmitted disease (STD) testing.  Diabetes screening. This is done by checking your blood sugar (glucose) after you have not eaten for a while  (fasting). You may have this done every 1-3 years.  Bone density scan. This is done to screen for osteoporosis. You may have this done starting at age 26.  Mammogram. This may be done every 1-2 years. Talk to your health care provider about how often you should have regular mammograms. Talk with your health care provider about your test results, treatment options, and if necessary, the need for more tests. Vaccines  Your health care provider may recommend certain vaccines, such as:  Influenza vaccine. This is recommended every year.  Tetanus, diphtheria, and acellular pertussis (Tdap, Td) vaccine. You may need a Td booster every 10 years.  Zoster vaccine. You may need this after age 78.  Pneumococcal 13-valent conjugate (PCV13) vaccine. One dose is recommended after age 66.  Pneumococcal polysaccharide (PPSV23) vaccine. One dose is recommended after age 39. Talk to your health care provider about which screenings and vaccines you need and how often you need them. This information is not intended to replace advice given to you by your health care provider. Make sure you discuss any questions you have with your health care provider. Document Released: 11/12/2015 Document Revised: 07/05/2016 Document Reviewed: 08/17/2015 Elsevier Interactive Patient Education  2017 [employer] Prevention in the Home Falls can cause injuries. They can happen to people of all ages. There are many things you can do to make your home safe and to help prevent falls. What can I do on the outside of my home?  Regularly fix the edges of walkways and driveways and fix any cracks.  Remove anything that might make you trip as you walk through a door, such as a raised step or threshold.  Trim any bushes or trees on the path to your home.  Use bright outdoor lighting.  Clear any walking paths of anything that might make someone trip, such as rocks or tools.  Regularly check to see if handrails are loose  or broken. Make sure that both sides of any steps have handrails.  Any raised decks and porches should have guardrails on the edges.  Have any leaves, snow, or ice cleared regularly.  Use sand or salt on walking paths during winter.  Clean up any spills in your garage right away. This includes oil or grease spills. What can I do in the bathroom?  Use night lights.  Install grab bars by the toilet and in the tub and shower. Do not use towel bars as grab bars.  Use non-skid mats or decals in the tub or shower.  If you need to sit down in the shower, use a plastic, non-slip stool.  Keep the floor dry. Clean up any water that spills on the floor as soon as it happens.  Remove soap buildup in the tub or shower regularly.  Attach bath mats securely with double-sided non-slip rug tape.  Do not have throw rugs and other things on the floor that can make you trip. What can I do in the bedroom?  Use night lights.  Make sure that you have a light by your bed that is easy to reach.  Do not use any sheets or blankets that are too big for your bed. They should not hang down onto the floor.  Have a firm chair that has side arms. You can use this for  support while you get dressed.  Do not have throw rugs and other things on the floor that can make you trip. What can I do in the kitchen?  Clean up any spills right away.  Avoid walking on wet floors.  Keep items that you use a lot in easy-to-reach places.  If you need to reach something above you, use a strong step stool that has a grab bar.  Keep electrical cords out of the way.  Do not use floor polish or wax that makes floors slippery. If you must use wax, use non-skid floor wax.  Do not have throw rugs and other things on the floor that can make you trip. What can I do with my stairs?  Do not leave any items on the stairs.  Make sure that there are handrails on both sides of the stairs and use them. Fix handrails that are  broken or loose. Make sure that handrails are as long as the stairways.  Check any carpeting to make sure that it is firmly attached to the stairs. Fix any carpet that is loose or worn.  Avoid having throw rugs at the top or bottom of the stairs. If you do have throw rugs, attach them to the floor with carpet tape.  Make sure that you have a light switch at the top of the stairs and the bottom of the stairs. If you do not have them, ask someone to add them for you. What else can I do to help prevent falls?  Wear shoes that:  Do not have high heels.  Have rubber bottoms.  Are comfortable and fit you well.  Are closed at the toe. Do not wear sandals.  If you use a stepladder:  Make sure that it is fully opened. Do not climb a closed stepladder.  Make sure that both sides of the stepladder are locked into place.  Ask someone to hold it for you, if possible.  Clearly mark and make sure that you can see:  Any grab bars or handrails.  First and last steps.  Where the edge of each step is.  Use tools that help you move around (mobility aids) if they are needed. These include:  Canes.  Walkers.  Scooters.  Crutches.  Turn on the lights when you go into a dark area. Replace any light bulbs as soon as they burn out.  Set up your furniture so you have a clear path. Avoid moving your furniture around.  If any of your floors are uneven, fix them.  If there are any pets around you, be aware of where they are.  Review your medicines with your doctor. Some medicines can make you feel dizzy. This can increase your chance of falling. Ask your doctor what other things that you can do to help prevent falls. This information is not intended to replace advice given to you by your health care provider. Make sure you discuss any questions you have with your health care provider. Document Released: 08/12/2009 Document Revised: 03/23/2016 Document Reviewed: 11/20/2014 Elsevier  Interactive Patient Education  2017 [employer].

## 2019-12-01 NOTE — Progress Notes (Signed)
This visit is being conducted via phone call due to the COVID-19 pandemic. This patient has given me verbal consent via phone to conduct this visit, patient states they are participating from their home address. Some vital signs may be absent or patient reported.   Patient identification: identified by name, DOB, and current address.  Location provider: [facility], Office Persons participating in the virtual visit: Angel Holland and Franne Forts, LPN.    Subjective:   Angel Holland is a 79 y.o. female who presents for Medicare Annual (Subsequent) preventive examination.  Angel Holland is stable at this time. She continues to have chronic back pain but states the pain medication at bedtime helps her rest well during the night. She reports that she is still having frequent diarrhea and fecal incontinence. During medication review, she revealed she is taking dicyclomine only once daily. Suggested she try to increase that and take as prescribed to see if that helps these symptoms.   Review of Systems:  No ROS; Annual Medicare Wellness subsequent visit  Cardiac Risk Factors include: advanced age (>78mn, >>40women);diabetes mellitus;sedentary lifestyle    Objective:     Vitals: Ht 4' 10" (1.473 m)   Wt 130 lb (59 kg)   BMI 27.17 kg/m   Body mass index is 27.17 kg/m.  Advanced Directives 12/01/2019 12/19/2017 09/09/2015 07/12/2015  Does Patient Have a Medical Advance Directive? Yes Yes Yes Yes  Type of AParamedicof AIrwinLiving will HGuadalupeLiving will Living will;Healthcare Power of Attorney Living will  Does patient want to make changes to medical advance directive? No - Patient declined - No - Patient declined No - Patient declined  Copy of HBentonin Chart? No - copy requested No - copy requested No - copy requested No - copy requested    Tobacco Social History   Tobacco Use  Smoking Status Former Smoker  .  Packs/day: 1.00  . Years: 50.00  . Pack years: 50.00  . Types: Cigarettes  . Quit date: 07/23/2008  . Years since quitting: 11.3  Smokeless Tobacco Never Used     Counseling given: Not Answered   Clinical Intake:  Pre-visit preparation completed: Yes  Pain : 0-10 Pain Score: 2  Pain Type: Chronic pain Pain Location: Back     BMI - recorded: 27.17 Nutritional Status: BMI 25 -29 Overweight Nutritional Risks: None Diabetes: Yes CBG done?: No Did pt. bring in CBG monitor from home?: (does not have kit and does not check at home)  How often do you need to have someone help you when you read instructions, pamphlets, or other written materials from your doctor or pharmacy?: 1 - Never What is the last grade level you completed in school?: some college  Interpreter Needed?: No  Information entered by :: CFranne Forts LPN.  Past Medical History:  Diagnosis Date  . Anxiety    no meds  . Arthritis    back and fingers   . Bruxism   . Cataract    baby cataracts per pt.  . Colon polyps   . DDD (degenerative disc disease), lumbar   . DDD (degenerative disc disease), lumbosacral   . Depression    no meds  . Diverticulosis of colon (without mention of hemorrhage)   . Fibroid uterus   . Headache, paroxysmal hemicrania, episodic 03/24/2014  . Hemorrhoids   . History of D&C    x2  . History of night sweats   .  History of rectal bleeding    08/2017  . Hx of diverticulitis of colon 03/30/2014  . Hyperglycemia   . Irritable bowel syndrome   . PAF (paroxysmal atrial fibrillation) ([place]) 01/2018   pt declined [place]  . Pulmonary nodules - reports followed by pulmonology, reports following up with them in June 2015 03/26/2013   Repeat CT chest 04/20/14 > 1. Scattered pulmonary nodules measure 4 mm or less in size, are unchanged from 03/28/2013 and are therefore considered benign    . Tobacco abuse   . Unspecified hemorrhoids without mention of complication    Past Surgical  History:  Procedure Laterality Date  . APPENDECTOMY  1957  . bce    . CHOLECYSTECTOMY  2009  . DILATION AND CURETTAGE OF UTERUS    . fibroid removed from uterus    . HYSTEROSCOPY WITH D & C N/A 09/09/2015   Procedure: DILATATION AND CURETTAGE /HYSTEROSCOPY;  Surgeon: Vanessa Kick, MD;  Location: [facility];  Service: Gynecology;  Laterality: N/A;   Family History  Problem Relation Age of Onset  . Heart disease Mother        CHF  . Diverticulosis Mother   . Bone cancer Father        found in his mouth first  . Colon cancer Neg Hx    Social History   Socioeconomic History  . Marital status: Married    Spouse name: Not on file  . Number of children: 2  . Years of education: Not on file  . Highest education level: Some college, no degree  Occupational History  . Occupation: retired    Comment: Press photographer  Tobacco Use  . Smoking status: Former Smoker    Packs/day: 1.00    Years: 50.00    Pack years: 50.00    Types: Cigarettes    Quit date: 07/23/2008    Years since quitting: 11.3  . Smokeless tobacco: Never Used  Substance and Sexual Activity  . Alcohol use: No  . Drug use: No  . Sexual activity: Not Currently  Other Topics Concern  . 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 Difficulty of Paying Living Expenses: Not hard at all  Food Insecurity: No Food Insecurity  . Worried About Charity fundraiser in the Last Year: Never true  . Ran Out of Food in the Last Year: Never true  Transportation Needs: No Transportation Needs  . Lack of Transportation (Medical): No  . Lack of Transportation (Non-Medical): No  Physical Activity: Inactive  .  Days of Exercise per Week: 0 days  . Minutes of Exercise per Session: 0 min  Stress: Stress Concern Present  . Feeling of Stress : Rather much  Social Connections: Not Isolated  . Frequency of Communication with Friends and Family: More than three times a week  . Frequency of Social Gatherings with Friends and Family: Once a week  . Attends Religious Services: More than 4 times per year  . Active Member of Clubs or  Organizations: Yes  . Attends Archivist Meetings: More than 4 times per year  . Marital Status: Married    Outpatient Encounter Medications as of 12/01/2019  Medication Sig  . Ascorbic Acid (VITAMIN C PO) Take by mouth daily.  Marland Kitchen aspirin 81 MG tablet Take 81 mg by mouth daily.    . cholecalciferol (VITAMIN D) 1000 UNITS tablet Take 1,000 Units by mouth daily.  . Cyanocobalamin (VITAMIN B-12 PO) Take by mouth daily.  Marland Kitchen dicyclomine (BENTYL) 10 MG capsule TAKE 1 CAPSULE BY MOUTH FOUR TIMES DAILY AS NEEDED FOR SPASMS  . diltiazem (CARDIZEM CD) 120 MG 24 hr capsule Take 1 capsule (120 mg total) by mouth daily.  . Doxylamine Succinate, Sleep, (WAL-SOM PO) Take 1 tablet by mouth at bedtime as needed.  . hydrocodone-ibuprofen (VICOPROFEN) 5-200 MG tablet Take 1 tablet by mouth daily as needed for pain.  . IBUPROFEN PO Take by mouth.  . loperamide (IMODIUM) 2 MG capsule Take 2 mg by mouth as needed.   . metroNIDAZOLE (METROGEL) 0.75 % gel Apply 1 application topically at bedtime. Applies to face for rosacea  . OVER THE COUNTER MEDICATION Petra Kuba made Calcium mag zinc-2 daily  . triamcinolone (NASACORT ALLERGY 24HR) 55 MCG/ACT AERO nasal inhaler Place 2 sprays into the nose as needed.  . Triprolidine-Pseudoephedrine (ANTIHISTAMINE PO) Take 1 tablet by mouth daily as needed.  . Wheat Dextrin (BENEFIBER DRINK MIX PO) Take 1 each by mouth 2 (two) times daily.   Facility-Administered Encounter Medications as of 12/01/2019  Medication  . 0.9 %  sodium chloride infusion     Activities of Daily Living In your present state of health, do you have any difficulty performing the following activities: 12/01/2019  Hearing? N  Vision? N  Difficulty concentrating or making decisions? N  Walking or climbing stairs? N  Dressing or bathing? N  Doing errands, shopping? N  Preparing Food and eating ? N  Using the Toilet? N  In the past six months, have you accidently leaked urine? Y  Do you have problems with loss of bowel control? Y  Managing your Medications? N  Managing your Finances? N  Housekeeping or managing your Housekeeping? N  Some recent data might be hidden    Patient Care Team: Caren Macadam, MD as PCP - General (Family Medicine) Sydnee Levans, MD as Referring Physician (Dermatology)    Assessment:   This is a routine wellness examination for Angel Holland.  Exercise Activities and Dietary recommendations Current Exercise Habits: The patient does not participate in regular exercise at present, Exercise limited by: orthopedic condition(s);Other - see comments(fecal incontinence)  Goals   None     Fall Risk Fall Risk  12/01/2019 08/11/2019 07/22/2018 07/17/2017 07/13/2016  Falls in the past year? 0 0 Yes No No  Number falls in past yr: - 0 1 - -  Injury with Fall? - 0 Yes - -  Risk for fall due to : Medication side effect;Orthopedic patient - - - -  Follow up Falls evaluation completed;Education provided;Falls prevention discussed - - - -   Is the patient's home free of loose throw rugs in walkways, pet beds, electrical cords, etc?   yes      Grab bars in the bathroom? yes      Handrails on the stairs?   yes      Adequate lighting?   yes  Timed Get Up and Go performed: N/A  Depression Screen PHQ 2/9 Scores 08/11/2019 07/22/2018 07/17/2017 07/13/2016  PHQ -  2 Score 0 0 3 3  PHQ- 9 Score - - 11 -     Cognitive Function     6CIT Screen 12/01/2019  What Year? 0 points  What month? 0 points  What time? 0 points  Count back from 20 0 points   Months in reverse 0 points  Repeat phrase 0 points  Total Score 0    Immunization History  Administered Date(s) Administered  . Fluad Quad(high Dose 65+) 07/31/2019  . Influenza Split 06/30/2012  . Influenza Whole 10/30/2006, 07/20/2010  . Influenza, High Dose Seasonal PF 07/12/2015, 07/13/2016, 07/17/2017  . Influenza,inj,Quad PF,6+ Mos 07/24/2013, 07/22/2018  . Influenza-Unspecified 06/30/2014  . Pneumococcal Conjugate-13 07/12/2015  . Pneumococcal Polysaccharide-23 10/30/2004, 07/24/2013  . Td 10/30/2004  . Tdap 11/02/2014  . Zoster 11/02/2014  . Zoster Recombinat (Shingrix) 09/22/2018    Qualifies for Shingles Vaccine?patient has already completed  Screening Tests Health Maintenance  Topic Date Due  . DEXA SCAN  07/11/2025 (Originally 10/09/2005)  . HEMOGLOBIN A1C  01/29/2020  . OPHTHALMOLOGY EXAM  04/29/2020  . MAMMOGRAM  07/14/2020  . URINE MICROALBUMIN  07/30/2020  . FOOT EXAM  08/10/2020  . TETANUS/TDAP  11/02/2024  . INFLUENZA VACCINE  Completed  . PNA vac Low Risk Adult  Completed    Cancer Screenings: Lung: Low Dose CT Chest recommended if Age 54-80 years, 30 pack-year currently smoking OR have quit w/in 15years. Patient does not qualify. Breast:  Up to date on Mammogram? Yes   Up to date of Bone Density/Dexa? No; declines additional testing.  Colorectal: completed 12/19/18/2019; up to date.   Additional Screenings:  Hepatitis C Screening: N/A due to age     Plan:   Mrs. Blye will try the dicyclomine as prescribed to see if it helps with fecal incontinence and diarrhea (was taking only once daily). Requested records on shingrix administration from [facility] by fax today.   I have personally reviewed and noted the following in the patient's chart:   . Medical and social history . Use of alcohol, tobacco or illicit drugs  . Current medications and supplements . Functional ability and status . Nutritional status . Physical  activity . Advanced directives . List of other physicians . Hospitalizations, surgeries, and ER visits in previous 12 months . Vitals . Screenings to include cognitive, depression, and falls . Referrals and appointments  In addition, I have reviewed and discussed with patient certain preventive protocols, quality metrics, and best practice recommendations. A written personalized care plan for preventive services as well as general preventive health recommendations were provided to patient.     Franne Forts, LPN  02/05/7025

## 2019-12-26 ENCOUNTER — Ambulatory Visit: Payer: Medicare Other | Attending: Internal Medicine

## 2019-12-26 DIAGNOSIS — Z23 Encounter for immunization: Secondary | ICD-10-CM

## 2019-12-26 NOTE — Progress Notes (Signed)
   Covid-19 Vaccination Clinic  Name:  Angel Holland    MRN: [id number] DOB: September 08, 1941  12/26/2019  [name] was observed post Covid-19 immunization for 15 minutes without incidence. She was provided with Vaccine Information Sheet and instruction to access the V-Safe system.   [name] was instructed to call 911 with any severe reactions post vaccine: Marland Kitchen Difficulty breathing  . Swelling of your face and throat  . A fast heartbeat  . A bad rash all over your body  . Dizziness and weakness    Immunizations Administered    Name Date Dose VIS Date Route   Pfizer COVID-19 Vaccine 12/26/2019 12:41 PM 0.3 mL 10/10/2019 Intramuscular   Manufacturer: [place]   Lot: [id number]   [place]: [id number]

## 2020-01-20 ENCOUNTER — Ambulatory Visit: Payer: Medicare Other | Attending: Internal Medicine

## 2020-01-20 DIAGNOSIS — Z23 Encounter for immunization: Secondary | ICD-10-CM

## 2020-01-20 NOTE — Progress Notes (Signed)
   Covid-19 Vaccination Clinic  Name:  Angel Holland    MRN: [id number] DOB: Nov 02, 1940  01/20/2020  Ms. Field was observed post Covid-19 immunization for 30 minutes based on pre-vaccination screening without incident. She was provided with Vaccine Information Sheet and instruction to access the V-Safe system.   [name] was instructed to call 911 with any severe reactions post vaccine: Marland Kitchen Difficulty breathing  . Swelling of face and throat  . A fast heartbeat  . A bad rash all over body  . Dizziness and weakness   Immunizations Administered    Name Date Dose VIS Date Route   Pfizer COVID-19 Vaccine 01/20/2020  3:25 PM 0.3 mL 10/10/2019 Intramuscular   Manufacturer: [place]   Lot: [id number]   [place]: [id number]

## 2020-01-22 ENCOUNTER — Other Ambulatory Visit: Payer: Self-pay | Admitting: Physician Assistant

## 2020-01-30 ENCOUNTER — Other Ambulatory Visit: Payer: Self-pay

## 2020-01-30 ENCOUNTER — Emergency Department (HOSPITAL_COMMUNITY): Payer: Medicare Other

## 2020-01-30 ENCOUNTER — Emergency Department (HOSPITAL_COMMUNITY)
Admission: EM | Admit: 2020-01-30 | Discharge: 2020-01-30 | Disposition: A | Discharge status: Home / Self Care | Payer: Medicare Other | Attending: Emergency Medicine | Admitting: Emergency Medicine

## 2020-01-30 ENCOUNTER — Encounter (HOSPITAL_COMMUNITY): Payer: Self-pay | Admitting: Emergency Medicine

## 2020-01-30 DIAGNOSIS — I480 Paroxysmal atrial fibrillation: Secondary | ICD-10-CM | POA: Diagnosis not present

## 2020-01-30 DIAGNOSIS — Z7982 Long term (current) use of aspirin: Secondary | ICD-10-CM | POA: Insufficient documentation

## 2020-01-30 DIAGNOSIS — R0789 Other chest pain: Secondary | ICD-10-CM | POA: Diagnosis not present

## 2020-01-30 DIAGNOSIS — E119 Type 2 diabetes mellitus without complications: Secondary | ICD-10-CM | POA: Diagnosis not present

## 2020-01-30 DIAGNOSIS — Z79899 Other long term (current) drug therapy: Secondary | ICD-10-CM | POA: Insufficient documentation

## 2020-01-30 DIAGNOSIS — R0902 Hypoxemia: Secondary | ICD-10-CM | POA: Diagnosis not present

## 2020-01-30 DIAGNOSIS — R079 Chest pain, unspecified: Secondary | ICD-10-CM | POA: Diagnosis not present

## 2020-01-30 DIAGNOSIS — R457 State of emotional shock and stress, unspecified: Secondary | ICD-10-CM | POA: Diagnosis not present

## 2020-01-30 LAB — COMPREHENSIVE METABOLIC PANEL
ALT: 15 U/L (ref 0–44)
AST: 20 U/L (ref 15–41)
Albumin: 3.8 g/dL (ref 3.5–5.0)
Alkaline Phosphatase: 56 U/L (ref 38–126)
Anion gap: 11 (ref 5–15)
BUN: 15 mg/dL (ref 8–23)
CO2: 22 mmol/L (ref 22–32)
Calcium: 9.1 mg/dL (ref 8.9–10.3)
Chloride: 105 mmol/L (ref 98–111)
Creatinine, Ser: 0.74 mg/dL (ref 0.44–1.00)
GFR calc Af Amer: >60 mL/min (ref >60)
GFR calc non Af Amer: >60 mL/min (ref >60)
Glucose, Bld: 103 mg/dL — ABNORMAL HIGH (ref 70–99)
Potassium: 4.3 mmol/L (ref 3.5–5.1)
Sodium: 138 mmol/L (ref 135–145)
Total Bilirubin: 0.2 mg/dL — ABNORMAL LOW (ref 0.3–1.2)
Total Protein: 6.4 g/dL — ABNORMAL LOW (ref 6.5–8.1)

## 2020-01-30 LAB — CBC WITH DIFFERENTIAL/PLATELET
Abs Immature Granulocytes: 0.03 10*3/uL (ref 0.00–0.07)
Basophils Absolute: 0.1 10*3/uL (ref 0.0–0.1)
Basophils Relative: 1 %
Eosinophils Absolute: 0.1 10*3/uL (ref 0.0–0.5)
Eosinophils Relative: 1 %
HCT: 38.3 % (ref 36.0–46.0)
Hemoglobin: 12.3 g/dL (ref 12.0–15.0)
Immature Granulocytes: 0 %
Lymphocytes Relative: 22 %
Lymphs Abs: 1.8 10*3/uL (ref 0.7–4.0)
MCH: 28.5 pg (ref 26.0–34.0)
MCHC: 32.1 g/dL (ref 30.0–36.0)
MCV: 88.9 fL (ref 80.0–100.0)
Monocytes Absolute: 0.7 10*3/uL (ref 0.1–1.0)
Monocytes Relative: 8 %
Neutro Abs: 5.5 10*3/uL (ref 1.7–7.7)
Neutrophils Relative %: 68 %
Platelets: 256 10*3/uL (ref 150–400)
RBC: 4.31 MIL/uL (ref 3.87–5.11)
RDW: 14.4 % (ref 11.5–15.5)
WBC: 8.1 10*3/uL (ref 4.0–10.5)
nRBC: 0 % (ref 0.0–0.2)

## 2020-01-30 LAB — LACTIC ACID, PLASMA
Lactic Acid, Venous: 2.1 mmol/L (ref 0.5–1.9)
Lactic Acid, Venous: 1.4 mmol/L (ref 0.5–1.9)

## 2020-01-30 LAB — TROPONIN I (HIGH SENSITIVITY)
Troponin I (High Sensitivity): 4 ng/L (ref <18)
Troponin I (High Sensitivity): 6 ng/L (ref <18)

## 2020-01-30 LAB — D-DIMER, QUANTITATIVE: D-Dimer, Quant: 0.57 ug/mL-FEU — ABNORMAL HIGH (ref 0.00–0.50)

## 2020-01-30 LAB — LIPASE, BLOOD: Lipase: 22 U/L (ref 11–51)

## 2020-01-30 NOTE — ED Notes (Signed)
Pt verbalizes understanding of d/c instructions. Pt taken to lobby via wheelchair at d/c with all belongings.

## 2020-01-30 NOTE — ED Provider Notes (Signed)
[facility] EMERGENCY DEPARTMENT Provider Note   CSN: [id number] Arrival date & time: 01/30/20  1542     History Chief Complaint  Patient presents with  . Chest Pain    Left sided    Angel Holland is a 79 y.o. female.  The history is provided by the patient and medical records. No language interpreter was used.  Chest Pain Pain location:  L chest Pain quality: aching and dull   Pain radiates to:  Does not radiate Pain severity:  Moderate Onset quality:  Gradual Duration:  4 days Timing:  Intermittent Progression:  Waxing and waning Relieved by:  Nothing Worsened by:  Nothing Ineffective treatments:  None tried Associated symptoms: no abdominal pain, no altered mental status, no back pain, no claudication, no cough, no diaphoresis, no dizziness, no fatigue, no fever, no headache, no heartburn, no lower extremity edema, no nausea, no near-syncope, no numbness, no palpitations, no shortness of breath, no vomiting and no weakness   Risk factors: not female        Past Medical History:  Diagnosis Date  . Anxiety    no meds  . Arthritis    back and fingers   . Bruxism   . Cataract    baby cataracts per pt.  . Colon polyps   . DDD (degenerative disc disease), lumbar   . DDD (degenerative disc disease), lumbosacral   . Depression    no meds  . Diverticulosis of colon (without mention of hemorrhage)   . Fibroid uterus   . Headache, paroxysmal hemicrania, episodic 03/24/2014  . Hemorrhoids   . History of D&C    x2  . History of night sweats   . History of rectal bleeding    08/2017  . Hx of diverticulitis of colon 03/30/2014  . Hyperglycemia   . Irritable bowel syndrome   . PAF (paroxysmal atrial fibrillation) ([place]) 01/2018   pt declined [facility]  . Pulmonary nodules - reports followed by pulmonology, reports following up with them in June 2015 03/26/2013   Repeat CT chest 04/20/14 > 1. Scattered pulmonary nodules measure 4 mm or less in size, are unchanged  from 03/28/2013 and are therefore considered benign    . Tobacco abuse   . Unspecified hemorrhoids without mention of complication     Patient Active Problem List   Diagnosis Date Noted  . Yeast vaginitis 03/05/2019  . Low back pain 03/05/2019  . PAF (paroxysmal atrial fibrillation) ([place]) 11/26/2018  . Palpitations 01/18/2018  . Aortic atherosclerosis ([place]) 07/17/2017  . Diabetes mellitus type II, controlled ([place]) 07/12/2015  . Mass of foot 07/12/2015  . History of IBS - followed by [facility] 03/30/2014    Past Surgical History:  Procedure Laterality Date  . APPENDECTOMY  1957  . bce    . CHOLECYSTECTOMY  2009  . DILATION AND CURETTAGE OF UTERUS    . fibroid removed from uterus    . HYSTEROSCOPY WITH D & C N/A 09/09/2015   Procedure: DILATATION AND CURETTAGE /HYSTEROSCOPY;  Surgeon: Vanessa Kick, MD;  Location: [facility];  Service: Gynecology;  Laterality: N/A;     OB History    Gravida  2   Para  2   Term  2   Preterm      AB      Living  2     SAB      TAB      Ectopic      Multiple  Live Births              Family History  Problem Relation Age of Onset  . Heart disease Mother        CHF  . Diverticulosis Mother   . Bone cancer Father        found in his mouth first  . Colon cancer Neg Hx     Social History   Tobacco Use  . Smoking status: Former Smoker    Packs/day: 1.00    Years: 50.00    Pack years: 50.00    Types: Cigarettes    Quit date: 07/23/2008    Years since quitting: 11.5  . Smokeless tobacco: Never Used  Substance Use Topics  . Alcohol use: No  . Drug use: No    Home Medications Prior to Admission medications   Medication Sig Start Date End Date Taking? Authorizing Provider  Ascorbic Acid (VITAMIN C PO) Take by mouth daily.    [provider]  aspirin 81 MG tablet Take 81 mg by mouth daily.      [provider]  cholecalciferol (VITAMIN D) 1000 UNITS tablet Take 1,000 Units by mouth daily.     [provider]  Cyanocobalamin (VITAMIN B-12 PO) Take by mouth daily.    [provider]  dicyclomine (BENTYL) 10 MG capsule TAKE 1 CAPSULE BY MOUTH FOUR TIMES DAILY AS NEEDED FOR SPASMS 08/26/19   Ladene Artist, MD  diltiazem (CARDIZEM CD) 120 MG 24 hr capsule TAKE 1 CAPSULE(120 MG) BY MOUTH DAILY 01/26/20   Lorretta Harp, MD  Doxylamine Succinate, Sleep, (WAL-SOM PO) Take 1 tablet by mouth at bedtime as needed.    [provider]  hydrocodone-ibuprofen (VICOPROFEN) 5-200 MG tablet Take 1 tablet by mouth daily as needed for pain. 11/24/19   Caren Macadam, MD  IBUPROFEN PO Take by mouth.    [provider]  loperamide (IMODIUM) 2 MG capsule Take 2 mg by mouth as needed.     [provider]  metroNIDAZOLE (METROGEL) 0.75 % gel Apply 1 application topically at bedtime. Applies to face for rosacea    [provider]  OVER THE COUNTER MEDICATION Nature made Calcium mag zinc-2 daily    [provider]  triamcinolone (NASACORT ALLERGY 24HR) 55 MCG/ACT AERO nasal inhaler Place 2 sprays into the nose as needed.    [provider]  Triprolidine-Pseudoephedrine (ANTIHISTAMINE PO) Take 1 tablet by mouth daily as needed.    [provider]  Wheat Dextrin (BENEFIBER DRINK MIX PO) Take 1 each by mouth 2 (two) times daily.    [provider]    Allergies    Biaxin [clarithromycin], Codeine, Decongestant [pseudoephedrine hcl er], Phenylephrine, Rosuvastatin, and Acetaminophen  Review of Systems   Review of Systems  Constitutional: Negative for chills, diaphoresis, fatigue and fever.  HENT: Negative for congestion and rhinorrhea.   Eyes: Negative for visual disturbance.  Respiratory: Negative for cough, choking, chest tightness, shortness of breath, wheezing and stridor.   Cardiovascular: Positive for chest pain. Negative for palpitations, claudication, leg swelling and near-syncope.  Gastrointestinal:  Negative for abdominal pain, constipation, diarrhea, heartburn, nausea and vomiting.  Genitourinary: Negative for dysuria, enuresis, flank pain and frequency.  Musculoskeletal: Negative for back pain, neck pain and neck stiffness.  Skin: Negative for rash and wound.  Neurological: Negative for dizziness, weakness, light-headedness, numbness and headaches.  Psychiatric/Behavioral: Negative for agitation and confusion.  All other systems reviewed and are negative.   Physical  Exam Updated Vital Signs BP 127/67 (BP Location: Right Arm)   Pulse 64   Temp 98.2 F (36.8 C) (Oral)   Resp 12   Ht 5\' 10"  (1.778 m)   Wt 59 kg   SpO2 95%   BMI 18.65 kg/m   Physical Exam Vitals and nursing note reviewed.  Constitutional:      General: She is not in acute distress.    Appearance: She is well-developed. She is not ill-appearing, toxic-appearing or diaphoretic.  HENT:     Head: Normocephalic and atraumatic.     Right Ear: External ear normal.     Left Ear: External ear normal.     Nose: Nose normal.     Mouth/Throat:     Pharynx: No oropharyngeal exudate.  Eyes:     Extraocular Movements: Extraocular movements intact.     Conjunctiva/sclera: Conjunctivae normal.     Pupils: Pupils are equal, round, and reactive to light.  Cardiovascular:     Rate and Rhythm: Normal rate and regular rhythm.  No extrasystoles are present.    Heart sounds: Normal heart sounds. No murmur.  Pulmonary:     Effort: No respiratory distress.     Breath sounds: No stridor. No decreased breath sounds, wheezing, rhonchi or rales.  Abdominal:     General: There is no distension.     Tenderness: There is no abdominal tenderness. There is no rebound.  Musculoskeletal:     Cervical back: Normal range of motion and neck supple.     Right lower leg: No tenderness. No edema.  Skin:    General: Skin is warm.     Findings: No erythema or rash.  Neurological:     General: No focal deficit present.     Mental Status:  She is alert and oriented to person, place, and time.     Cranial Nerves: No cranial nerve deficit.     Motor: No weakness or abnormal muscle tone.     Coordination: Coordination normal.     Deep Tendon Reflexes: Reflexes are normal and symmetric.  Psychiatric:        Mood and Affect: Mood normal.     ED Results / Procedures / Treatments   Labs (all labs ordered are listed, but only abnormal results are displayed) Labs Reviewed  COMPREHENSIVE METABOLIC PANEL - Abnormal; Notable for the following components:      Result Value   Glucose, Bld 103 (*)    Total Protein 6.4 (*)    Total Bilirubin 0.2 (*)    All other components within normal limits  LACTIC ACID, PLASMA - Abnormal; Notable for the following components:   Lactic Acid, Venous 2.1 (*)    All other components within normal limits  D-DIMER, QUANTITATIVE (NOT AT [facility]) - Abnormal; Notable for the following components:   D-Dimer, Quant 0.57 (*)    All other components within normal limits  CBC WITH DIFFERENTIAL/PLATELET  LIPASE, BLOOD  LACTIC ACID, PLASMA  TROPONIN I (HIGH SENSITIVITY)  TROPONIN I (HIGH SENSITIVITY)    EKG EKG Interpretation  Date/Time:  Friday January 30 2020 15:44:11 EDT Ventricular Rate:  65 PR Interval:    QRS Duration: 85 QT Interval:  409 QTC Calculation: 426 R Axis:   -14 Text Interpretation: Sinus rhythm Low voltage, precordial leads Borderline T abnormalities, anterior leads When compared to prior, no significant changes seen. No sTEMI Confirmed by Antony Blackbird [phone]) on 01/30/2020 3:49:56 PM   Radiology DG Chest 2 View  Result Date: 01/30/2020  CLINICAL DATA:  Transient chest pain EXAM: CHEST - 2 VIEW COMPARISON:  09/19/2007 chest radiograph. FINDINGS: Stable cardiomediastinal silhouette with normal heart size. No pneumothorax. No pleural effusion. Lungs appear clear, with no acute consolidative airspace disease and no pulmonary edema. IMPRESSION: No active cardiopulmonary disease.  Electronically Signed   By: Ilona Sorrel M.D.   On: 01/30/2020 16:54    Procedures Procedures (including critical care time)  Medications Ordered in ED Medications - No data to display  ED Course  I have reviewed the triage vital signs and the nursing notes.  Pertinent labs & imaging results that were available during my care of the patient were reviewed by me and considered in my medical decision making (see chart for details).    MDM Rules/Calculators/A&P                      Angel Holland is a 79 y.o. female with a past medical history significant for diabetes, paroxysmal atrial fibrillation not on anticoagulation now, IBS with chronic diarrhea, diabetes, and prior diverticulitis who presents with chest pain.  She reports that for the last 4 days, patient has been having intermittent chest discomfort.  She reports it feels like a muscle cramp and dull pain in her left chest that does not radiate.  She denies associated shortness of breath, fevers, chills, congestion, cough, palpitations, diaphoresis, nausea, vomiting, lightheadedness, syncope, or recent trauma.  She reports it was an 8 out of 10 in severity when it comes and goes.  She reports it was ongoing last night before she went to sleep.  She reports that this morning it recurred again when somebody was at her home who told her to call 911.  Patient took 4 aspirin and received 1 subungual nitroglycerin with EMS but she reports the pain had already improved by the time she took a nitro.  She does not think it helped.  She denies any history of DVT or PE.  Denies any leg pain or leg swelling.  Denies any other complaints aside from the intermittent left-sided chest discomfort.  She does report she had some spicy beans the other day but does not think they cause her discomfort as she has no history of GERD to her report.  On exam, lungs are clear and chest is nontender.  Abdomen is nontender.  Good pulses in all extremities.  Legs are  nontender nonedematous.  Patient is resting comfortably with reassuring vital signs on arrival.  Patient has no bruising or evidence of trauma.  Patient has good pulses in extremities.  EKG shows no STEMI and similar to prior.  Given the patient's description of symptoms, I anticipate this is an atypical chest pain or more musculoskeletal chest wall discomfort given her reported feels like a muscle spasm however, given her age and comorbidities, will get work-up to look for other etiologies.  She does have a cardiologist with Dr. Alvester Chou and if work-up is reassuring, anticipate discharge with cardiology follow-up.  We will get troponin, D-dimer, and other labs.  Will get chest x-ray.  She is currently chest pain-free and is resting comfortably.  Anticipate reassessment after work-up.  Delta troponin was negative.  D-dimer was normal when age-adjusted and we discussed this with patient.  She agrees with not doing a CT PE study.  Other labs reassuring.  Patient is feeling better and suspect this is her chest wall hurting.  She would like to go home.  Patient was able to eat and  drink without difficulty.  She will follow up with her cardiologist.  She understands extremely strict return precautions and was discharged in good condition with improved symptoms and otherwise reassuring work-up.   Final Clinical Impression(s) / ED Diagnoses Final diagnoses:  Chest pain, unspecified type    Rx / DC Orders ED Discharge Orders    None     Clinical Impression: 1. Chest pain, unspecified type     Disposition: Discharge  Condition: Good  I have discussed the results, Dx and Tx plan with the pt(& family if present). He/she/they expressed understanding and agree(s) with the plan. Discharge instructions discussed at great length. Strict return precautions discussed and pt &/or family have verbalized understanding of the instructions. No further questions at time of discharge.    Discharge Medication  List as of 01/30/2020  8:57 PM      Follow Up: Caren Macadam, MD [address] [place] [address] [place] [phone]  Please call to schedule appointment Dr. Alvester Chou with your cardiology team.     Beth Spackman, Gwenyth Allegra, MD 01/30/20 [phone]

## 2020-01-30 NOTE — ED Triage Notes (Signed)
Pt. Presents from home via ems, awake and alert x 4, c/c Left sided chest pain, intermittent, that started today. Patient relates being under stress at home caring for her husband. Patient denies any active chest pain at this time, denies shortness of breath, no dizziness or weakness. Patient given 324mg  Aspirin, 0.4mg  NTG SL x 1 by ems PTA.

## 2020-01-30 NOTE — Discharge Instructions (Signed)
Your work-up today was overall reassuring as we discussed and we suspect it is more musculoskeletal chest wall pain causing your discomfort as you suspected initially.  Your D-dimer was normal when age-adjusted as we discussed.  Your troponins were negative both times we checked them.  X-ray was reassuring and you were having reassuring vitals during her monitoring..  We feel you are safe for discharge home with follow-up with your cardiology team and PCP.  Please rest and stay hydrated.  If any symptoms change or worsen, return to nearest emergency department.

## 2020-02-03 ENCOUNTER — Telehealth: Payer: Self-pay | Admitting: Family Medicine

## 2020-02-03 NOTE — Telephone Encounter (Signed)
hydrocodone-ibuprofen (VICOPROFEN) 5-200 MG tablet  [id number] [address], [place] Phone:  [phone]  Fax:  [phone]

## 2020-02-04 ENCOUNTER — Other Ambulatory Visit: Payer: Self-pay | Admitting: Family Medicine

## 2020-02-04 DIAGNOSIS — M545 Low back pain, unspecified: Secondary | ICD-10-CM

## 2020-02-04 DIAGNOSIS — G8929 Other chronic pain: Secondary | ICD-10-CM

## 2020-02-04 MED ORDER — HYDROCODONE-IBUPROFEN 5-200 MG PO TABS: 1.0000 | ORAL_TABLET | Freq: Every day | ORAL | 0 refills | Status: DC | PRN

## 2020-02-04 NOTE — Telephone Encounter (Signed)
Can you cancel rx to [facility]? I have resent to [facility]

## 2020-02-04 NOTE — Telephone Encounter (Signed)
Spoke with Aldona Bar at [facility] and she stated the Rx has been cancelled.

## 2020-03-04 DIAGNOSIS — H259 Unspecified age-related cataract: Secondary | ICD-10-CM | POA: Diagnosis not present

## 2020-03-04 DIAGNOSIS — Z135 Encounter for screening for eye and ear disorders: Secondary | ICD-10-CM | POA: Diagnosis not present

## 2020-04-07 DIAGNOSIS — H2511 Age-related nuclear cataract, right eye: Secondary | ICD-10-CM | POA: Diagnosis not present

## 2020-04-07 DIAGNOSIS — Z01818 Encounter for other preprocedural examination: Secondary | ICD-10-CM | POA: Diagnosis not present

## 2020-04-07 DIAGNOSIS — H2512 Age-related nuclear cataract, left eye: Secondary | ICD-10-CM | POA: Diagnosis not present

## 2020-04-07 DIAGNOSIS — H353132 Nonexudative age-related macular degeneration, bilateral, intermediate dry stage: Secondary | ICD-10-CM | POA: Diagnosis not present

## 2020-04-08 ENCOUNTER — Other Ambulatory Visit: Payer: Self-pay | Admitting: Family Medicine

## 2020-04-08 ENCOUNTER — Telehealth: Payer: Self-pay | Admitting: Family Medicine

## 2020-04-08 DIAGNOSIS — M545 Low back pain, unspecified: Secondary | ICD-10-CM

## 2020-04-08 MED ORDER — HYDROCODONE-IBUPROFEN 5-200 MG PO TABS: 1.0000 | ORAL_TABLET | Freq: Every day | ORAL | 0 refills | Status: DC | PRN

## 2020-04-08 NOTE — Telephone Encounter (Signed)
Pt is requesting a refill on Hydrocodone-Ibuprofen 5-200 mg tablet. Pt uses [facility]. Thanks

## 2020-04-08 NOTE — Telephone Encounter (Signed)
done

## 2020-04-23 ENCOUNTER — Other Ambulatory Visit: Payer: Self-pay | Admitting: Cardiovascular Disease

## 2020-06-18 DIAGNOSIS — H2512 Age-related nuclear cataract, left eye: Secondary | ICD-10-CM | POA: Diagnosis not present

## 2020-06-24 ENCOUNTER — Other Ambulatory Visit: Payer: Self-pay | Admitting: Family Medicine

## 2020-06-24 ENCOUNTER — Telehealth: Payer: Self-pay | Admitting: Family Medicine

## 2020-06-24 DIAGNOSIS — G8929 Other chronic pain: Secondary | ICD-10-CM

## 2020-06-24 MED ORDER — HYDROCODONE-IBUPROFEN 5-200 MG PO TABS: 1.0000 | ORAL_TABLET | Freq: Every day | ORAL | 0 refills | Status: DC | PRN

## 2020-06-24 NOTE — Telephone Encounter (Signed)
done

## 2020-06-24 NOTE — Telephone Encounter (Signed)
Pt is calling in needing a refill on her hydrocodone-iboprofen (VICOPROFEN) 5-200 MG  [facility] [address] [facility]

## 2020-07-15 DIAGNOSIS — Z1231 Encounter for screening mammogram for malignant neoplasm of breast: Secondary | ICD-10-CM | POA: Diagnosis not present

## 2020-07-15 LAB — HM MAMMOGRAPHY

## 2020-07-21 ENCOUNTER — Ambulatory Visit (INDEPENDENT_AMBULATORY_CARE_PROVIDER_SITE_OTHER): Payer: Medicare Other | Admitting: *Deleted

## 2020-07-21 ENCOUNTER — Other Ambulatory Visit: Payer: Self-pay

## 2020-07-21 DIAGNOSIS — Z23 Encounter for immunization: Secondary | ICD-10-CM

## 2020-07-22 ENCOUNTER — Encounter: Payer: Self-pay | Admitting: Family Medicine

## 2020-08-16 ENCOUNTER — Other Ambulatory Visit: Payer: Self-pay | Admitting: Cardiovascular Disease

## 2020-08-17 ENCOUNTER — Telehealth: Payer: Self-pay | Admitting: Cardiovascular Disease

## 2020-08-17 NOTE — Telephone Encounter (Signed)
We can continue to fill

## 2020-08-17 NOTE — Telephone Encounter (Signed)
Pt c/o medication issue:  1. Name of Medication:   2. How are you currently taking this medication (dosage and times per day)? diltiazem (CARDIZEM CD) 120 MG 24 hr capsule  3. Are you having a reaction (difficulty breathing--STAT)? No   4. What is your medication issue? Pt is out of medication refills; needs new prescription filled by Dr. Gwenlyn Found. wants to know if she could get 90 day instead of 30 day.

## 2020-08-17 NOTE — Telephone Encounter (Signed)
The patient was seen July 2020 for telehealth visit and was planned with PRN follow-up. Cardizem CD 120 mg was filled at that time. The patient is now requesting 90 day refills.  Dr. Gwenlyn Found - would you like to continue to fill or defer to PCP to fill?

## 2020-08-18 LAB — PULMONARY FUNCTION TEST
DL/VA % pred: 84 %
DL/VA: 3.46 ml/min/mmHg/L
DLCO unc % pred: 67 %
DLCO unc: 11.88 ml/min/mmHg
FEF 25-75 Post: 1.17 L/sec
FEF 25-75 Pre: 1.65 L/sec
FEF2575-%Change-Post: -29 %
FEF2575-%Pred-Post: 77 %
FEF2575-%Pred-Pre: 110 %
FEV1-%Change-Post: -5 %
FEV1-%Pred-Post: 94 %
FEV1-%Pred-Pre: 100 %
FEV1-Post: 1.64 L
FEV1-Pre: 1.74 L
FEV1FVC-%Change-Post: -6 %
FEV1FVC-%Pred-Pre: 100 %
FEV6-%Change-Post: 2 %
FEV6-%Pred-Post: 104 %
FEV6-%Pred-Pre: 101 %
FEV6-Post: 2.3 L
FEV6-Pre: 2.24 L
FEV6FVC-%Change-Post: 0 %
FEV6FVC-%Pred-Post: 103 %
FEV6FVC-%Pred-Pre: 104 %
FVC-%Change-Post: 0 %
FVC-%Pred-Post: 100 %
FVC-%Pred-Pre: 99 %
FVC-Post: 2.34 L
Post FEV1/FVC ratio: 70 %
Post FEV6/FVC ratio: 99 %
Pre FEV1/FVC ratio: 75 %
Pre FEV6/FVC Ratio: 99 %
RV % pred: 96 %
RV: 1.92 L
TLC % pred: 87 %
TLC: 3.76 L

## 2020-08-18 MED ORDER — DILTIAZEM HCL ER COATED BEADS 120 MG PO CP24: 120.0000 mg | ORAL_CAPSULE | Freq: Every day | ORAL | 3 refills | Status: DC

## 2020-08-18 NOTE — Telephone Encounter (Signed)
Refill sent per Dr. Gwenlyn Found.

## 2020-09-10 ENCOUNTER — Other Ambulatory Visit: Payer: Self-pay | Admitting: Family Medicine

## 2020-09-10 ENCOUNTER — Telehealth: Payer: Self-pay

## 2020-09-10 DIAGNOSIS — M545 Low back pain, unspecified: Secondary | ICD-10-CM

## 2020-09-10 MED ORDER — HYDROCODONE-IBUPROFEN 5-200 MG PO TABS: 1.0000 | ORAL_TABLET | Freq: Every day | ORAL | 0 refills | Status: DC | PRN

## 2020-09-10 NOTE — Telephone Encounter (Signed)
Patient called in needing a refill on medication    hydrocodone-ibuprofen (VICOPROFEN) 5-200 MG tablet    [id number] [place] [place]

## 2020-09-10 NOTE — Telephone Encounter (Signed)
Refill sent.

## 2020-09-13 ENCOUNTER — Other Ambulatory Visit: Payer: Self-pay | Admitting: Cardiovascular Disease

## 2020-09-13 ENCOUNTER — Other Ambulatory Visit: Payer: Self-pay | Admitting: Family Medicine

## 2020-09-13 DIAGNOSIS — G8929 Other chronic pain: Secondary | ICD-10-CM

## 2020-09-13 DIAGNOSIS — M545 Low back pain, unspecified: Secondary | ICD-10-CM

## 2020-09-13 MED ORDER — HYDROCODONE-IBUPROFEN 5-200 MG PO TABS: 1.0000 | ORAL_TABLET | Freq: Every day | ORAL | 0 refills | Status: DC | PRN

## 2020-09-13 NOTE — Telephone Encounter (Signed)
Rx sent to wrong pharmacy wanting to know if the Rx can be sent to another pharmacy    [id number] [place], [place]

## 2020-09-13 NOTE — Telephone Encounter (Signed)
Spoke with [name] at [name] and she was informed to cancel the Rx as below.

## 2020-09-13 NOTE — Telephone Encounter (Signed)
Marathon; I have resent rx to [facility] requested. Please cancel [facility] that was sent on Friday. thanks

## 2020-09-15 DIAGNOSIS — D1801 Hemangioma of skin and subcutaneous tissue: Secondary | ICD-10-CM | POA: Diagnosis not present

## 2020-09-15 DIAGNOSIS — L304 Erythema intertrigo: Secondary | ICD-10-CM | POA: Diagnosis not present

## 2020-09-15 DIAGNOSIS — Z85828 Personal history of other malignant neoplasm of skin: Secondary | ICD-10-CM | POA: Diagnosis not present

## 2020-09-15 DIAGNOSIS — L570 Actinic keratosis: Secondary | ICD-10-CM | POA: Diagnosis not present

## 2020-09-15 DIAGNOSIS — L821 Other seborrheic keratosis: Secondary | ICD-10-CM | POA: Diagnosis not present

## 2020-09-15 DIAGNOSIS — L718 Other rosacea: Secondary | ICD-10-CM | POA: Diagnosis not present

## 2020-09-17 ENCOUNTER — Other Ambulatory Visit: Payer: Self-pay | Admitting: Gastroenterology

## 2020-09-20 ENCOUNTER — Other Ambulatory Visit: Payer: Self-pay | Admitting: Cardiovascular Disease

## 2020-09-20 NOTE — Telephone Encounter (Signed)
Spoke with pt she will call pharmacy and after refills expire she will have PCP refill since she is PRN-f/u

## 2020-09-20 NOTE — Telephone Encounter (Signed)
*  STAT* If patient is at the pharmacy, call can be transferred to refill team.   1. Which medications need to be refilled? (please list name of each medication and dose if known) diltiazem (CARDIZEM CD) 120 MG 24 hr capsule  2. Which pharmacy/location (including street and city if local pharmacy) is medication to be sent to? [facility] [place], [place]  3. Do they need a 30 day or 90 day supply? [place]

## 2020-09-28 ENCOUNTER — Telehealth: Payer: Self-pay | Admitting: Family Medicine

## 2020-09-28 NOTE — Telephone Encounter (Signed)
error 

## 2020-10-26 DIAGNOSIS — M545 Low back pain, unspecified: Secondary | ICD-10-CM | POA: Diagnosis not present

## 2020-10-26 DIAGNOSIS — M79605 Pain in left leg: Secondary | ICD-10-CM | POA: Diagnosis not present

## 2020-10-26 DIAGNOSIS — M25552 Pain in left hip: Secondary | ICD-10-CM | POA: Insufficient documentation

## 2020-11-08 ENCOUNTER — Other Ambulatory Visit: Payer: Self-pay | Admitting: Gastroenterology

## 2020-11-08 ENCOUNTER — Telehealth: Payer: Self-pay | Admitting: Gastroenterology

## 2020-11-08 ENCOUNTER — Telehealth: Payer: Self-pay | Admitting: Family Medicine

## 2020-11-08 MED ORDER — DICYCLOMINE HCL 10 MG PO CAPS: ORAL_CAPSULE | ORAL | 0 refills | Status: DC

## 2020-11-08 NOTE — Telephone Encounter (Signed)
Prescription sent to patient's pharmacy until scheduled appt. 

## 2020-11-08 NOTE — Telephone Encounter (Signed)
Pt would like a refill for:  hydrocodone-ibuprofen (VICOPROFEN) 5-200 MG tablet  [id number] [address], [place] - [place], [place] [address]  Phone:  [phone] Fax:  [phone]

## 2020-11-09 ENCOUNTER — Other Ambulatory Visit: Payer: Self-pay | Admitting: Family Medicine

## 2020-11-09 DIAGNOSIS — G8929 Other chronic pain: Secondary | ICD-10-CM

## 2020-11-09 MED ORDER — HYDROCODONE-IBUPROFEN 5-200 MG PO TABS: 1.0000 | ORAL_TABLET | Freq: Every day | ORAL | 0 refills | Status: DC | PRN

## 2020-11-09 NOTE — Telephone Encounter (Signed)
I sent in refill for her, but she has to have office visit before I can give any additional medication.

## 2020-11-09 NOTE — Telephone Encounter (Signed)
Spoke with the pt and informed her of the message below.  Patient stated she fell into her tub last night and has breast pain.  Due to diarrhea a phone visit was scheduled for 1/12 at 11am.  Follow up visit scheduled for 12/22/2020.

## 2020-11-10 ENCOUNTER — Ambulatory Visit (INDEPENDENT_AMBULATORY_CARE_PROVIDER_SITE_OTHER): Payer: Medicare Other | Admitting: Family Medicine

## 2020-11-10 ENCOUNTER — Encounter: Payer: Self-pay | Admitting: Family Medicine

## 2020-11-10 ENCOUNTER — Other Ambulatory Visit: Payer: Self-pay

## 2020-11-10 DIAGNOSIS — S2001XA Contusion of right breast, initial encounter: Secondary | ICD-10-CM | POA: Diagnosis not present

## 2020-11-10 DIAGNOSIS — G8929 Other chronic pain: Secondary | ICD-10-CM

## 2020-11-10 DIAGNOSIS — M545 Low back pain, unspecified: Secondary | ICD-10-CM | POA: Diagnosis not present

## 2020-11-10 MED ORDER — HYDROCODONE-IBUPROFEN 5-200 MG PO TABS: 1.0000 | ORAL_TABLET | Freq: Every day | ORAL | 0 refills | Status: DC | PRN

## 2020-11-10 NOTE — Progress Notes (Signed)
Virtual Visit via Telephone Note  I connected with Angel Holland  on 11/10/20 at 11:00 AM EST by telephone and verified that I am speaking with the correct person using two identifiers.   I discussed the limitations, risks, security and privacy concerns of performing an evaluation and management service by telephone and the availability of in person appointments. I also discussed with the patient that there may be a patient responsible charge related to this service. The patient expressed understanding and agreed to proceed.  Location patient: home Location provider:  [facility]  [place], [place] [address]  Participants present for the call: patient, provider Patient did not have a visit in the prior 7 days to address this/these issue(s).   History of Present Illness: (husband passed away in 2023-05-19). She has been very dependent on son for transportation.   "I'm kind of sore". Hurt self back in November; hurt back. Didn't address it right away. After christmas went to ortho at [facility] and was given prednisone; has one more pill to take tomorrow. Pain in left hip running down calf of left leg. Feels better now, but hasn't been able to walk (has been in wheelchair). Prednisone has been helping. Also gave her something to take at bedtime and with glass of water - which causes her to have night time urination. Yesterday morning at 3am was sound asleep and in sleep she got out of bed, went to bathroom; then slipped, fell and right breast hit side of commode. Breast is black now. Slept in recliner rest of night. Hasn't been to bed. Breast is sore all the way through; even hurts with deep breath. Bra mashes on breast and is painful.   Gabapentin was the med given to her at bedtime.   Still using the bentyl as needed; has enough to get her through to appointment with Dr. Fuller Plan next month.   When she took too much tylenol pm is when she started with having heart palpitation.  Diltiazem was started by Dr. Gwenlyn Found at that time. She was confused after she talked with them    Observations/Objective: Patient sounds cheerful and well on the phone. I do not appreciate any SOB. Speech and thought processing are grossly intact. Patient reported vitals:  Assessment and Plan: 1. Chronic midline low back pain without sciatica She is doing well with ortho and control of back pain.  - hydrocodone-ibuprofen (VICOPROFEN) 5-200 MG tablet; Take 1 tablet by mouth daily as needed for pain. Need office visit prior to additional refills.  Dispense: 30 tablet; Refill: 0  2. Traumatic ecchymosis of right female breast, initial encounter We discussed using ice/heat alternating to help with bruising of the breast. Discussed what to expect as this heals normally. Cautioned to watch for any worsening of swelling, pain, which could indicate infection.    Follow Up Instructions: She has follow up next month with me. She has follow up later this week with ortho. Call if any concerns in the meanwhile.  [phone] [id number] [phone] 11-20 [phone] 21-30 I did not refer this patient for an OV in the next 24 hours for this/these issue(s).  I discussed the assessment and treatment plan with the patient. The patient was provided an opportunity to ask questions and all were answered. The patient agreed with the plan and demonstrated an understanding of the instructions.   The patient was advised to call back or seek an in-person evaluation if the symptoms worsen or if the condition fails  to improve as anticipated.  I provided 23 minutes of non-face-to-face time during this encounter.   Micheline Rough, MD

## 2020-11-12 DIAGNOSIS — M79605 Pain in left leg: Secondary | ICD-10-CM | POA: Diagnosis not present

## 2020-11-12 DIAGNOSIS — M545 Low back pain, unspecified: Secondary | ICD-10-CM | POA: Diagnosis not present

## 2020-12-01 ENCOUNTER — Ambulatory Visit: Payer: Self-pay

## 2020-12-01 ENCOUNTER — Telehealth: Payer: Self-pay | Admitting: Family Medicine

## 2020-12-01 NOTE — Telephone Encounter (Signed)
Patient informed of the message below.

## 2020-12-01 NOTE — Telephone Encounter (Signed)
This is normal in setting of fall/bruising. She can use heat on that area and we will check when she is here. It is common to get some calcification after significant bruise. We will examine at visit, but let me know if enlarging in meanwhile (should gradually improve in size)

## 2020-12-01 NOTE — Telephone Encounter (Signed)
FYI:  Pt was wanting the provider know that she fell about 2 weeks ago and hit her R breast on the side of the commode and it bruised very badly and now it is going back to her normal color, but now she has found a knot in that breast.  She wanted to just let the doctor know so when she comes in on 12/22/2020 it can be discussed.

## 2020-12-02 ENCOUNTER — Telehealth: Payer: Self-pay | Admitting: Family Medicine

## 2020-12-02 ENCOUNTER — Ambulatory Visit (INDEPENDENT_AMBULATORY_CARE_PROVIDER_SITE_OTHER): Payer: Medicare Other

## 2020-12-02 DIAGNOSIS — Z0000 Encounter for general adult medical examination without abnormal findings: Secondary | ICD-10-CM | POA: Diagnosis not present

## 2020-12-02 NOTE — Patient Instructions (Signed)
[name] , Thank you for taking time to come for your Medicare Wellness Visit. I appreciate your ongoing commitment to your health goals. Please review the following plan we discussed and let me know if I can assist you in the future.   Screening recommendations/referrals: Colonoscopy: No longer required  Mammogram: Up to date, next due 07/15/2021 Bone Density: Patient declined  Recommended yearly ophthalmology/optometry visit for glaucoma screening and checkup Recommended yearly dental visit for hygiene and checkup  Vaccinations: Influenza vaccine: Up to date, next due fall 2022  Pneumococcal vaccine: Completed series  Tdap vaccine: Up to date, next due 11/02/2024 Shingles vaccine: Completed series     Advanced directives: Please bring in copies of your advanced medical directives so that we may scan into your chart.   Conditions/risks identified: None   Next appointment: 12/22/2020 @ 11:00 am with Dr. Ethlyn Gallery   Preventive Care 65 Years and Older, Female Preventive care refers to lifestyle choices and visits with your health care provider that can promote health and wellness. What does preventive care include?  A yearly physical exam. This is also called an annual well check.  Dental exams once or twice a year.  Routine eye exams. Ask your health care provider how often you should have your eyes checked.  Personal lifestyle choices, including:  Daily care of your teeth and gums.  Regular physical activity.  Eating a healthy diet.  Avoiding tobacco and drug use.  Limiting alcohol use.  Practicing safe sex.  Taking low-dose aspirin every day.  Taking vitamin and mineral supplements as recommended by your health care provider. What happens during an annual well check? The services and screenings done by your health care provider during your annual well check will depend on your age, overall health, lifestyle risk factors, and family history of disease. Counseling   Your health care provider may ask you questions about your:  Alcohol use.  Tobacco use.  Drug use.  Emotional well-being.  Home and relationship well-being.  Sexual activity.  Eating habits.  History of falls.  Memory and ability to understand (cognition).  Work and work Statistician.  Reproductive health. Screening  You may have the following tests or measurements:  Height, weight, and BMI.  Blood pressure.  Lipid and cholesterol levels. These may be checked every 5 years, or more frequently if you are over 29 years old.  Skin check.  Lung cancer screening. You may have this screening every year starting at age 84 if you have a 30-pack-year history of smoking and currently smoke or have quit within the past 15 years.  Fecal occult blood test (FOBT) of the stool. You may have this test every year starting at age 47.  Flexible sigmoidoscopy or colonoscopy. You may have a sigmoidoscopy every 5 years or a colonoscopy every 10 years starting at age 95.  Hepatitis C blood test.  Hepatitis B blood test.  Sexually transmitted disease (STD) testing.  Diabetes screening. This is done by checking your blood sugar (glucose) after you have not eaten for a while (fasting). You may have this done every 1-3 years.  Bone density scan. This is done to screen for osteoporosis. You may have this done starting at age 31.  Mammogram. This may be done every 1-2 years. Talk to your health care provider about how often you should have regular mammograms. Talk with your health care provider about your test results, treatment options, and if necessary, the need for more tests. Vaccines  Your health care  provider may recommend certain vaccines, such as:  Influenza vaccine. This is recommended every year.  Tetanus, diphtheria, and acellular pertussis (Tdap, Td) vaccine. You may need a Td booster every 10 years.  Zoster vaccine. You may need this after age 47.  Pneumococcal 13-valent  conjugate (PCV13) vaccine. One dose is recommended after age 31.  Pneumococcal polysaccharide (PPSV23) vaccine. One dose is recommended after age 50. Talk to your health care provider about which screenings and vaccines you need and how often you need them. This information is not intended to replace advice given to you by your health care provider. Make sure you discuss any questions you have with your health care provider. Document Released: 11/12/2015 Document Revised: 07/05/2016 Document Reviewed: 08/17/2015 Elsevier Interactive Patient Education  2017 [place] Prevention in the Home Falls can cause injuries. They can happen to people of all ages. There are many things you can do to make your home safe and to help prevent falls. What can I do on the outside of my home?  Regularly fix the edges of walkways and driveways and fix any cracks.  Remove anything that might make you trip as you walk through a door, such as a raised step or threshold.  Trim any bushes or trees on the path to your home.  Use bright outdoor lighting.  Clear any walking paths of anything that might make someone trip, such as rocks or tools.  Regularly check to see if handrails are loose or broken. Make sure that both sides of any steps have handrails.  Any raised decks and porches should have guardrails on the edges.  Have any leaves, snow, or ice cleared regularly.  Use sand or salt on walking paths during winter.  Clean up any spills in your garage right away. This includes oil or grease spills. What can I do in the bathroom?  Use night lights.  Install grab bars by the toilet and in the tub and shower. Do not use towel bars as grab bars.  Use non-skid mats or decals in the tub or shower.  If you need to sit down in the shower, use a plastic, non-slip stool.  Keep the floor dry. Clean up any water that spills on the floor as soon as it happens.  Remove soap buildup in the tub or  shower regularly.  Attach bath mats securely with double-sided non-slip rug tape.  Do not have throw rugs and other things on the floor that can make you trip. What can I do in the bedroom?  Use night lights.  Make sure that you have a light by your bed that is easy to reach.  Do not use any sheets or blankets that are too big for your bed. They should not hang down onto the floor.  Have a firm chair that has side arms. You can use this for support while you get dressed.  Do not have throw rugs and other things on the floor that can make you trip. What can I do in the kitchen?  Clean up any spills right away.  Avoid walking on wet floors.  Keep items that you use a lot in easy-to-reach places.  If you need to reach something above you, use a strong step stool that has a grab bar.  Keep electrical cords out of the way.  Do not use floor polish or wax that makes floors slippery. If you must use wax, use non-skid floor wax.  Do not have throw  rugs and other things on the floor that can make you trip. What can I do with my stairs?  Do not leave any items on the stairs.  Make sure that there are handrails on both sides of the stairs and use them. Fix handrails that are broken or loose. Make sure that handrails are as long as the stairways.  Check any carpeting to make sure that it is firmly attached to the stairs. Fix any carpet that is loose or worn.  Avoid having throw rugs at the top or bottom of the stairs. If you do have throw rugs, attach them to the floor with carpet tape.  Make sure that you have a light switch at the top of the stairs and the bottom of the stairs. If you do not have them, ask someone to add them for you. What else can I do to help prevent falls?  Wear shoes that:  Do not have high heels.  Have rubber bottoms.  Are comfortable and fit you well.  Are closed at the toe. Do not wear sandals.  If you use a stepladder:  Make sure that it is fully  opened. Do not climb a closed stepladder.  Make sure that both sides of the stepladder are locked into place.  Ask someone to hold it for you, if possible.  Clearly mark and make sure that you can see:  Any grab bars or handrails.  First and last steps.  Where the edge of each step is.  Use tools that help you move around (mobility aids) if they are needed. These include:  Canes.  Walkers.  Scooters.  Crutches.  Turn on the lights when you go into a dark area. Replace any light bulbs as soon as they burn out.  Set up your furniture so you have a clear path. Avoid moving your furniture around.  If any of your floors are uneven, fix them.  If there are any pets around you, be aware of where they are.  Review your medicines with your doctor. Some medicines can make you feel dizzy. This can increase your chance of falling. Ask your doctor what other things that you can do to help prevent falls. This information is not intended to replace advice given to you by your health care provider. Make sure you discuss any questions you have with your health care provider. Document Released: 08/12/2009 Document Revised: 03/23/2016 Document Reviewed: 11/20/2014 Elsevier Interactive Patient Education  2017 [employer].

## 2020-12-02 NOTE — Progress Notes (Signed)
Subjective:   Angel Holland is a 80 y.o. female who presents for Medicare Annual (Subsequent) preventive examination.  I connected with Jolene Provost today by telephone and verified that I am speaking with the correct person using two identifiers. Location patient: home Location provider: work Persons participating in the virtual visit: patient, provider.   I discussed the limitations, risks, security and privacy concerns of performing an evaluation and management service by telephone and the availability of in person appointments. I also discussed with the patient that there may be a patient responsible charge related to this service. The patient expressed understanding and verbally consented to this telephonic visit.    Interactive audio and video telecommunications were attempted between this provider and patient, however failed, due to patient having technical difficulties OR patient did not have access to video capability.  We continued and completed visit with audio only.      Review of Systems    N/A  Cardiac Risk Factors include: advanced age (>4men, >44 women);diabetes mellitus     Objective:    Today's Vitals   12/02/20 1447  PainSc: 10-Worst pain ever   There is no height or weight on file to calculate BMI.  Advanced Directives 12/02/2020 01/30/2020 12/01/2019 12/19/2017 09/09/2015 07/12/2015  Does Patient Have a Medical Advance Directive? Yes No Yes Yes Yes Yes  Type of Estate agent of [place];Living will - Healthcare Power of [place];Living will Healthcare Power of [place];Living will Living will;Healthcare Power of Attorney Living will  Does patient want to make changes to medical advance directive? No - Patient declined - No - Patient declined - No - Patient declined No - Patient declined  Copy of Healthcare Power of Attorney in Chart? No - copy requested - No - copy requested No - copy requested No - copy requested No - copy requested  Would patient  like information on creating a medical advance directive? - No - Patient declined - - - -    Current Medications (verified) Outpatient Encounter Medications as of 12/02/2020  Medication Sig  . Ascorbic Acid (VITAMIN C PO) Take 1 tablet by mouth 2 (two) times daily with a meal.   . aspirin EC 81 MG tablet Take 81 mg by mouth See admin instructions. Take one tablet (81 mg) by mouth every morning, may also take one tablet (81 mg) at night as needed for pain/sleep  . CALCIUM-MAGNESIUM PO Take 2 tablets by mouth daily with supper.  . Cyanocobalamin (VITAMIN B-12 PO) Take 1 tablet by mouth daily with supper.   . dicyclomine (BENTYL) 10 MG capsule TAKE 1 CAPSULE BY MOUTH FOUR TIMES DAILY AS NEEDED FOR SPASMS  . diltiazem (CARDIZEM CD) 120 MG 24 hr capsule Take 1 capsule (120 mg total) by mouth daily.  Marland Kitchen gabapentin (NEURONTIN) 300 MG capsule gabapentin 300 mg capsule  TAKE 1 CAPSULE BY MOUTH EVERY DAY AT BEDTIME  . hydrocodone-ibuprofen (VICOPROFEN) 5-200 MG tablet Take 1 tablet by mouth daily as needed for pain. Need office visit prior to additional refills.  Marland Kitchen loperamide (IMODIUM) 2 MG capsule Take 2 mg by mouth daily as needed for diarrhea or loose stools.   . metroNIDAZOLE (METROGEL) 0.75 % gel Apply 1 application topically at bedtime. Applies to face for rosacea  . triamcinolone (NASACORT) 55 MCG/ACT AERO nasal inhaler Place 2 sprays into the nose daily as needed (congestion/sinus headache).   Marland Kitchen VITAMIN D PO Take 1 Units by mouth daily with breakfast.   . Wheat Dextrin (BENEFIBER  DRINK MIX PO) Take 15 mLs by mouth 2 (two) times daily.   . [DISCONTINUED] predniSONE (DELTASONE) 10 MG tablet prednisone 10 mg tablet   Facility-Administered Encounter Medications as of 12/02/2020  Medication  . 0.9 %  sodium chloride infusion    Allergies (verified) Biaxin [clarithromycin], Codeine, Decongestant [pseudoephedrine hcl er], Phenylephrine, Rosuvastatin, and Acetaminophen   History: Past Medical  History:  Diagnosis Date  . Anxiety    no meds  . Arthritis    back and fingers   . Bruxism   . Cataract    baby cataracts per pt.  . Colon polyps   . DDD (degenerative disc disease), lumbar   . DDD (degenerative disc disease), lumbosacral   . Depression    no meds  . Diverticulosis of colon (without mention of hemorrhage)   . Fibroid uterus   . Headache, paroxysmal hemicrania, episodic 03/24/2014  . Hemorrhoids   . History of D&C    x2  . History of night sweats   . History of rectal bleeding    08/2017  . Hx of diverticulitis of colon 03/30/2014  . Hyperglycemia   . Irritable bowel syndrome   . PAF (paroxysmal atrial fibrillation) ([place]) 01/2018   pt declined [place]  . Pulmonary nodules - reports followed by pulmonology, reports following up with them in June 2015 03/26/2013   Repeat CT chest 04/20/14 > 1. Scattered pulmonary nodules measure 4 mm or less in size, are unchanged from 03/28/2013 and are therefore considered benign    . Tobacco abuse   . Unspecified hemorrhoids without mention of complication    Past Surgical History:  Procedure Laterality Date  . APPENDECTOMY  1957  . bce    . CATARACT EXTRACTION, BILATERAL    . CHOLECYSTECTOMY  2009  . DILATION AND CURETTAGE OF UTERUS    . fibroid removed from uterus    . HYSTEROSCOPY WITH D & C N/A 09/09/2015   Procedure: DILATATION AND CURETTAGE /HYSTEROSCOPY;  Surgeon: Vanessa Kick, MD;  Location: [facility];  Service: Gynecology;  Laterality: N/A;   Family History  Problem Relation Age of Onset  . Heart disease Mother        CHF  . Diverticulosis Mother   . Bone cancer Father        found in his mouth first  . Colon cancer Neg Hx    Social History   Socioeconomic History  . Marital status: Married    Spouse name: Not on file  . Number of children: 2  . Years of education: Not on file  . Highest education level: Some college, no degree  Occupational History  . Occupation: retired    Comment: Press photographer  Tobacco  Use  . Smoking status: Former Smoker    Packs/day: 1.00    Years: 50.00    Pack years: 50.00    Types: Cigarettes    Quit date: 07/23/2008    Years since quitting: 12.3  . Smokeless tobacco: Never Used  Vaping Use  . Vaping Use: Never used  Substance and Sexual Activity  . Alcohol use: No  . Drug use: No  . Sexual activity: Not Currently  Other Topics Concern  . 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 Difficulty of Paying Living Expenses: Not hard at all  Food Insecurity: No Food Insecurity  . Worried About Charity fundraiser in the Last Year: Never true  . Ran Out of Food in the Last Year: Never true  Transportation Needs: No Transportation Needs  . Lack of Transportation (Medical): No  . Lack of Transportation (Non-Medical): No  Physical Activity: Inactive  . Days of Exercise per Week: 0 days  . Minutes of Exercise per Session: 0 min  Stress: No Stress Concern Present  . Feeling of Stress : Not at all  Social Connections: Socially Isolated  . Frequency of Communication with Friends and Family: Once a week  . Frequency of Social Gatherings with Friends and Family: Never  . Attends Religious Services: More than 4 times per year  . Active Member of Clubs or Organizations: No  . Attends Archivist Meetings: Never  . Marital Status: Widowed    Tobacco Counseling Counseling given: Not Answered   Clinical Intake:  Pre-visit preparation completed: Yes  Pain : 0-10 Pain Score: 10-Worst pain ever Pain Type: Acute pain Pain Location: Hip Pain Orientation: Left Pain Radiating Towards: calf of left leg Pain Descriptors /  Indicators: Aching Pain Onset: More than a month ago Pain Frequency: Intermittent Pain Relieving Factors: Walking  Pain Relieving Factors: Walking  Nutritional Risks: Nausea/ vomitting/ diarrhea (diarrhea) Diabetes: Yes CBG done?: No Did pt. bring in CBG monitor from home?: No  How often do you need to have someone help you when you read instructions, pamphlets, or other written materials from your doctor or pharmacy?: 1 - Never What is the last grade level you completed in school?: Some college  Diabetic?Yes Nutrition Risk Assessment:  Has the patient had any N/V/D within the last 2 months?  Yes  Does the patient have any non-healing wounds?  No  Has the patient had any unintentional weight loss or weight gain?  Yes , has been on prednisone for sciatica has gained 5 lbs   Diabetes:  Is the patient diabetic?  Yes  If diabetic, was a CBG obtained today?  No  Did the patient bring in their glucometer from home?  No  How often do you monitor your CBG's? Patient does not check glucose at home.   Financial Strains and Diabetes Management:  Are you having any financial strains with the device, your supplies or your medication? No .  Does the patient want to be seen by Chronic Care Management for management of their diabetes?  No  Would the patient like to be referred to a Nutritionist or for Diabetic Management?  No   Diabetic Exams:  Diabetic Eye Exam: Overdue for diabetic eye exam. Pt has been advised about the importance in completing this exam. Patient advised to call and schedule an eye exam. Diabetic Foot Exam: Overdue, Pt has been advised about the importance in completing this exam. Pt is scheduled for diabetic foot exam on 12/22/2020.      Information entered by :: [place] of Daily Living In your present state of health, do you have any difficulty performing the following activities: 12/02/2020  Hearing? N  Vision? N  Difficulty concentrating or  making decisions? N  Walking or climbing stairs?  N  Dressing or bathing? N  Doing errands, shopping? N  Preparing Food and eating ? N  Using the Toilet? N  In the past six months, have you accidently leaked urine? Y  Comment had 1 episode of bladder leakage within the past 6 months  Do you have problems with loss of bowel control? Y  Comment occassional issues of bowel incontience  Managing your Medications? N  Managing your Finances? N  Housekeeping or managing your Housekeeping? N  Some recent data might be hidden    Patient Care Team: Caren Macadam, MD as PCP - General (Family Medicine) Sydnee Levans, MD as Referring Physician (Dermatology)  Indicate any recent Medical Services you may have received from other than [facility] providers in the past year (date may be approximate).     Assessment:   This is a routine wellness examination for [place].  Hearing/Vision screen  Hearing Screening   125Hz  250Hz  500Hz  1000Hz  2000Hz  3000Hz  4000Hz  6000Hz  8000Hz   Right ear:           Left ear:           Vision Screening Comments: Patient states gets eyes exams once per year. Had bilateral cataract extraction in 2021  Dietary issues and exercise activities discussed: Current Exercise Habits: The patient does not participate in regular exercise at present, Exercise limited by: orthopedic condition(s)  Goals    . Increase physical activity     As you are able; this will help with your chronic back pain    . Patient Stated     Decrease fecal incontinence and diarrhea      Depression Screen PHQ 2/9 Scores 12/02/2020 08/11/2019 07/22/2018 07/17/2017 07/13/2016 07/12/2015 07/12/2015  PHQ - 2 Score 0 0 0 3 3 2 4   PHQ- 9 Score 0 - - 11 - - 10    Fall Risk Fall Risk  12/02/2020 12/01/2019 08/11/2019 07/22/2018 07/17/2017  Falls in the past year? 1 0 0 Yes No  Number falls in past yr: 0 - 0 1 -  Injury with Fall? 1 - 0 Yes -  Comment Bruised breast - - - -  Risk for fall due to : Medication  side effect Medication side effect;Orthopedic patient - - -  Follow up Falls evaluation completed;Falls prevention discussed Falls evaluation completed;Education provided;Falls prevention discussed - - -    FALL RISK PREVENTION PERTAINING TO THE HOME:  Any stairs in or around the home? No  If so, are there any without handrails? No  Home free of loose throw rugs in walkways, pet beds, electrical cords, etc? Yes  Adequate lighting in your home to reduce risk of falls? Yes   ASSISTIVE DEVICES UTILIZED TO PREVENT FALLS:  Life alert? No  Use of a cane, walker or w/c? No  Grab bars in the bathroom? No  Shower chair or bench in shower? Yes  Elevated toilet seat or a handicapped toilet? Yes    Cognitive Function:   Normal cognitive status assessed by direct observation by this Nurse Health Advisor. No abnormalities found.     6CIT Screen 12/01/2019  What Year? 0 points  What month? 0 points  What time? 0 points  Count back from 20 0 points  Months in reverse 0 points  Repeat phrase 0 points  Total Score 0    Immunizations Immunization History  Administered Date(s) Administered  . Fluad Quad(high Dose 65+) 07/31/2019, 07/21/2020  . Influenza Split 06/30/2012  . Influenza Whole 10/30/2006, 07/20/2010  . Influenza,  High Dose Seasonal PF 07/12/2015, 07/13/2016, 07/17/2017  . Influenza,inj,Quad PF,6+ Mos 07/24/2013, 07/22/2018  . Influenza,inj,quad, With Preservative 07/30/2020  . Influenza-Unspecified 06/30/2014  . PFIZER(Purple Top)SARS-COV-2 Vaccination 12/26/2019, 01/20/2020  . Pneumococcal Conjugate-13 07/12/2015  . Pneumococcal Polysaccharide-23 10/30/2004, 07/24/2013  . Td 10/30/2004  . Tdap 11/02/2014  . Zoster 11/02/2014  . Zoster Recombinat (Shingrix) 09/22/2018    TDAP status: Up to date  Flu Vaccine status: Up to date  Pneumococcal vaccine status: Up to date  Covid-19 vaccine status: Completed vaccines  Qualifies for Shingles Vaccine? Yes   Zostavax  completed Yes   Shingrix Completed?: Yes  Screening Tests Health Maintenance  Topic Date Due  . HEMOGLOBIN A1C  01/29/2020  . OPHTHALMOLOGY EXAM  04/29/2020  . COVID-19 Vaccine (3 - Booster for Pfizer series) 07/22/2020  . URINE MICROALBUMIN  07/30/2020  . FOOT EXAM  08/10/2020  . DEXA SCAN  07/11/2025 (Originally 10/09/2005)  . MAMMOGRAM  07/15/2021  . TETANUS/TDAP  11/02/2024  . INFLUENZA VACCINE  Completed  . PNA vac Low Risk Adult  Completed    Health Maintenance  Health Maintenance Due  Topic Date Due  . HEMOGLOBIN A1C  01/29/2020  . OPHTHALMOLOGY EXAM  04/29/2020  . COVID-19 Vaccine (3 - Booster for Pfizer series) 07/22/2020  . URINE MICROALBUMIN  07/30/2020  . FOOT EXAM  08/10/2020    Colorectal cancer screening: No longer required.   Mammogram status: Completed 07/15/2020. Repeat every year  Bone Density Status: Patient declined Bone density screening   Lung Cancer Screening: (Low Dose CT Chest recommended if Age 20-80 years, 30 pack-year currently smoking OR have quit w/in 15years.) does qualify.   Lung Cancer Screening Referral: No, due to age  Additional Screening:  Hepatitis C Screening: does not qualify;   Vision Screening: Recommended annual ophthalmology exams for early detection of glaucoma and other disorders of the eye. Is the patient up to date with their annual eye exam?  Yes  Who is the provider or what is the name of the office in which the patient attends annual eye exams? [facility] in [place] for eye exams, and Dr. Manuella Ghazi for eye surgery If pt is not established with a provider, would they like to be referred to a provider to establish care? No .   Dental Screening: Recommended annual dental exams for proper oral hygiene  Community Resource Referral / Chronic Care Management: CRR required this visit?  No   CCM required this visit?  No      Plan:     I have personally reviewed and noted the following in the patient's chart:    . Medical and social history . Use of alcohol, tobacco or illicit drugs  . Current medications and supplements . Functional ability and status . Nutritional status . Physical activity . Advanced directives . List of other physicians . Hospitalizations, surgeries, and ER visits in previous 12 months . Vitals . Screenings to include cognitive, depression, and falls . Referrals and appointments  In addition, I have reviewed and discussed with patient certain preventive protocols, quality metrics, and best practice recommendations. A written personalized care plan for preventive services as well as general preventive health recommendations were provided to patient.     Ofilia Neas, LPN   [id number]   Nurse Notes: None

## 2020-12-02 NOTE — Telephone Encounter (Signed)
During medicare wellness patient asked if she could have a refill of the Vicoprofen that was last prescribed to her on 11/10/2020. Patient states that she is still having significant amounts of pain. She is currently taking it once per day to help ease of the pain and she states she will not be out until the 12th but she wanted to request a refill now so that it could be filled at that time. She also states that she is still having pain under her right breast that at times when she lays down takes her breath away for a few seconds. Would like to know what you recommend. Please advise?

## 2020-12-03 ENCOUNTER — Other Ambulatory Visit: Payer: Self-pay | Admitting: Family Medicine

## 2020-12-03 DIAGNOSIS — M545 Low back pain, unspecified: Secondary | ICD-10-CM

## 2020-12-03 DIAGNOSIS — R079 Chest pain, unspecified: Secondary | ICD-10-CM

## 2020-12-03 DIAGNOSIS — G8929 Other chronic pain: Secondary | ICD-10-CM

## 2020-12-03 MED ORDER — HYDROCODONE-IBUPROFEN 5-200 MG PO TABS: 1.0000 | ORAL_TABLET | Freq: Every day | ORAL | 0 refills | Status: DC | PRN

## 2020-12-03 NOTE — Telephone Encounter (Signed)
That's fine. We can do it at appointment.

## 2020-12-03 NOTE — Telephone Encounter (Signed)
Spoke with Daja at [facility] and she stated the Rx will be cancelled.  Patient informed of the message below and stated she would prefer to have the x-ray done at the follow up appt she has scheduled on 2/23.  Message sent to PCP.

## 2020-12-03 NOTE — Telephone Encounter (Signed)
Please cancel Vicoprofen prescription to [facility].  I have resent a refill to [facility] (which is her preferred pharmacy for this).  I have ordered an x-ray that she can complete to evaluate at area under the right breast.  Am concerned she may have fractured a rib when she fell recently. Please help her with setting up xray appointment.

## 2020-12-10 DIAGNOSIS — M5136 Other intervertebral disc degeneration, lumbar region: Secondary | ICD-10-CM | POA: Diagnosis not present

## 2020-12-10 DIAGNOSIS — I700 Atherosclerosis of aorta: Secondary | ICD-10-CM | POA: Diagnosis not present

## 2020-12-10 DIAGNOSIS — M5459 Other low back pain: Secondary | ICD-10-CM | POA: Diagnosis not present

## 2020-12-21 ENCOUNTER — Encounter: Payer: Self-pay | Admitting: Gastroenterology

## 2020-12-21 ENCOUNTER — Other Ambulatory Visit: Payer: Self-pay

## 2020-12-21 ENCOUNTER — Ambulatory Visit (INDEPENDENT_AMBULATORY_CARE_PROVIDER_SITE_OTHER): Payer: Medicare Other | Admitting: Gastroenterology

## 2020-12-21 VITALS — BP 110/62 | HR 84 | Ht 70.0 in | Wt 135.0 lb

## 2020-12-21 DIAGNOSIS — K582 Mixed irritable bowel syndrome: Secondary | ICD-10-CM

## 2020-12-21 DIAGNOSIS — R152 Fecal urgency: Secondary | ICD-10-CM

## 2020-12-21 DIAGNOSIS — R159 Full incontinence of feces: Secondary | ICD-10-CM

## 2020-12-21 MED ORDER — DICYCLOMINE HCL 10 MG PO CAPS: ORAL_CAPSULE | ORAL | 11 refills | Status: DC

## 2020-12-21 NOTE — Progress Notes (Signed)
    History of Present Illness: This is an 80 year old female with IBS and an alternating pattern.  On days with diarrhea that she has occasional fecal incontinence.  She has little warning.  She takes dicyclomine as needed.  Imodium will control loose stools but often leads to hard stools with straining the following day.  Her symptoms have been unchanged for years.  Colonoscopy Feb 2019:  - Severe diverticulosis in the left colon. There was narrowing of the colon in association with the diverticular opening. There was evidence of diverticular spasm. Peri-diverticular erythema was seen. There was no evidence of diverticular bleeding. - Internal hemorrhoids. - The examination was otherwise normal on direct and retroflexion views. Random biopsies obtained were normal.   Current Medications, Allergies, Past Medical History, Past Surgical History, Family History and Social History were reviewed in [employer] record.   Physical Exam: General: Well developed, well nourished, no acute distress Head: Normocephalic and atraumatic Eyes: Sclerae anicteric, EOMI Ears: Normal auditory acuity Mouth: Not examined, mask on during Covid-19 pandemic Lungs: Clear throughout to auscultation Heart: Regular rate and rhythm; no murmurs, rubs or bruits Abdomen: Soft, non tender and non distended. No masses, hepatosplenomegaly or hernias noted. Normal Bowel sounds Rectal: Not done Musculoskeletal: Symmetrical with no gross deformities  Pulses:  Normal pulses noted Extremities: No clubbing, cyanosis, edema or deformities noted Neurological: Alert oriented x 4, grossly nonfocal Psychological:  Alert and cooperative. Normal mood and affect   Assessment and Recommendations:  1.  IBS with alternating pattern and frequent fecal incontinence.  Severe diverticulosis.  Adequate daily fiber and water intake.  Advised dicyclomine 10 mg tid AC and hs (not prn).  Advised Kegel exercises 10 times  daily long-term.  Advised sitting on commode at regular intervals each day to try to preempt incontinence.  She is advised to call if not improving with the above measures.  REV in 1 year.

## 2020-12-21 NOTE — Patient Instructions (Signed)
We have sent the following medications to your pharmacy for you to pick up at your convenience: dicyclomine four times a day before meals and at bedtime.   Please do Kegel exercises 10 times a day, everyday.    Kegel Exercises  Kegel exercises can help strengthen your pelvic floor muscles. The pelvic floor is a group of muscles that support your rectum, small intestine, and bladder. In females, pelvic floor muscles also help support the womb (uterus). These muscles help you control the flow of urine and stool. Kegel exercises are painless and simple, and they do not require any equipment. Your provider may suggest Kegel exercises to:  Improve bladder and bowel control.  Improve sexual response.  Improve weak pelvic floor muscles after surgery to remove the uterus (hysterectomy) or pregnancy (females).  Improve weak pelvic floor muscles after prostate gland removal or surgery (males). Kegel exercises involve squeezing your pelvic floor muscles, which are the same muscles you squeeze when you try to stop the flow of urine or keep from passing gas. The exercises can be done while sitting, standing, or lying down, but it is best to vary your position. Exercises How to do Kegel exercises: 1. Squeeze your pelvic floor muscles tight. You should feel a tight lift in your rectal area. If you are a female, you should also feel a tightness in your vaginal area. Keep your stomach, buttocks, and legs relaxed. 2. Hold the muscles tight for up to 10 seconds. 3. Breathe normally. 4. Relax your muscles. 5. Repeat as told by your health care provider. Repeat this exercise daily as told by your health care provider. Continue to do this exercise for at least 4-6 weeks, or for as long as told by your health care provider. You may be referred to a physical therapist who can help you learn more about how to do Kegel exercises. Depending on your condition, your health care provider may recommend:  Varying how  long you squeeze your muscles.  Doing several sets of exercises every day.  Doing exercises for several weeks.  Making Kegel exercises a part of your regular exercise routine. This information is not intended to replace advice given to you by your health care provider. Make sure you discuss any questions you have with your health care provider. Document Revised: 02/20/2020 Document Reviewed: 06/05/2018 Elsevier Patient Education  [place].

## 2020-12-22 ENCOUNTER — Encounter: Payer: Self-pay | Admitting: Family Medicine

## 2020-12-22 ENCOUNTER — Ambulatory Visit (INDEPENDENT_AMBULATORY_CARE_PROVIDER_SITE_OTHER): Payer: Medicare Other | Admitting: Family Medicine

## 2020-12-22 ENCOUNTER — Other Ambulatory Visit: Payer: Self-pay

## 2020-12-22 VITALS — BP 100/60 | HR 74 | Temp 98.3°F | Ht <= 58 in | Wt 134.4 lb

## 2020-12-22 DIAGNOSIS — Z8719 Personal history of other diseases of the digestive system: Secondary | ICD-10-CM | POA: Diagnosis not present

## 2020-12-22 DIAGNOSIS — E559 Vitamin D deficiency, unspecified: Secondary | ICD-10-CM | POA: Diagnosis not present

## 2020-12-22 DIAGNOSIS — E1169 Type 2 diabetes mellitus with other specified complication: Secondary | ICD-10-CM

## 2020-12-22 DIAGNOSIS — M5442 Lumbago with sciatica, left side: Secondary | ICD-10-CM | POA: Diagnosis not present

## 2020-12-22 DIAGNOSIS — I480 Paroxysmal atrial fibrillation: Secondary | ICD-10-CM | POA: Diagnosis not present

## 2020-12-22 DIAGNOSIS — E785 Hyperlipidemia, unspecified: Secondary | ICD-10-CM

## 2020-12-22 DIAGNOSIS — I700 Atherosclerosis of aorta: Secondary | ICD-10-CM | POA: Diagnosis not present

## 2020-12-22 DIAGNOSIS — E538 Deficiency of other specified B group vitamins: Secondary | ICD-10-CM

## 2020-12-22 DIAGNOSIS — S2001XS Contusion of right breast, sequela: Secondary | ICD-10-CM | POA: Diagnosis not present

## 2020-12-22 DIAGNOSIS — E119 Type 2 diabetes mellitus without complications: Secondary | ICD-10-CM | POA: Diagnosis not present

## 2020-12-22 LAB — LIPID PANEL
Cholesterol: 132 mg/dL (ref 0–200)
HDL: 35.5 mg/dL — ABNORMAL LOW (ref >39.00)
LDL Cholesterol: 74 mg/dL (ref 0–99)
NonHDL: 96.79
Total CHOL/HDL Ratio: 4
Triglycerides: 113 mg/dL (ref 0.0–149.0)
VLDL: 22.6 mg/dL (ref 0.0–40.0)

## 2020-12-22 LAB — CBC WITH DIFFERENTIAL/PLATELET
Basophils Absolute: 0.1 10*3/uL (ref 0.0–0.1)
Basophils Relative: 0.5 % (ref 0.0–3.0)
Eosinophils Absolute: 0.1 10*3/uL (ref 0.0–0.7)
Eosinophils Relative: 0.9 % (ref 0.0–5.0)
HCT: 37.8 % (ref 36.0–46.0)
Hemoglobin: 12.5 g/dL (ref 12.0–15.0)
Lymphocytes Relative: 17 % (ref 12.0–46.0)
Lymphs Abs: 1.6 10*3/uL (ref 0.7–4.0)
MCHC: 33 g/dL (ref 30.0–36.0)
MCV: 86.9 fl (ref 78.0–100.0)
Monocytes Absolute: 0.8 10*3/uL (ref 0.1–1.0)
Monocytes Relative: 8.3 % (ref 3.0–12.0)
Neutro Abs: 6.9 10*3/uL (ref 1.4–7.7)
Neutrophils Relative %: 73.3 % (ref 43.0–77.0)
Platelets: 304 10*3/uL (ref 150.0–400.0)
RBC: 4.35 Mil/uL (ref 3.87–5.11)
RDW: 15.3 % (ref 11.5–15.5)
WBC: 9.5 10*3/uL (ref 4.0–10.5)

## 2020-12-22 LAB — COMPREHENSIVE METABOLIC PANEL
ALT: 11 U/L (ref 0–35)
AST: 15 U/L (ref 0–37)
Albumin: 4.2 g/dL (ref 3.5–5.2)
Alkaline Phosphatase: 61 U/L (ref 39–117)
BUN: 17 mg/dL (ref 6–23)
CO2: 25 mEq/L (ref 19–32)
Calcium: 9.7 mg/dL (ref 8.4–10.5)
Chloride: 103 mEq/L (ref 96–112)
Creatinine, Ser: 0.74 mg/dL (ref 0.40–1.20)
GFR: 76.53 mL/min (ref >60.00)
Glucose, Bld: 100 mg/dL — ABNORMAL HIGH (ref 70–99)
Potassium: 4.5 mEq/L (ref 3.5–5.1)
Sodium: 137 mEq/L (ref 135–145)
Total Bilirubin: 0.4 mg/dL (ref 0.2–1.2)
Total Protein: 6.9 g/dL (ref 6.0–8.3)

## 2020-12-22 LAB — MICROALBUMIN / CREATININE URINE RATIO
Creatinine,U: 104.1 mg/dL
Microalb Creat Ratio: 0.8 mg/g (ref 0.0–30.0)
Microalb, Ur: 0.8 mg/dL (ref 0.0–1.9)

## 2020-12-22 LAB — HEMOGLOBIN A1C: Hgb A1c MFr Bld: 6.8 % — ABNORMAL HIGH (ref 4.6–6.5)

## 2020-12-22 LAB — VITAMIN B12: Vitamin B-12: >1506 pg/mL — ABNORMAL HIGH (ref 211–911)

## 2020-12-22 LAB — VITAMIN D 25 HYDROXY (VIT D DEFICIENCY, FRACTURES): VITD: 45.48 ng/mL (ref 30.00–100.00)

## 2020-12-22 NOTE — Progress Notes (Signed)
Angel Holland DOB: 1941/06/11 Encounter date: 12/22/2020  This is a 80 y.o. female who presents with Chief Complaint  Patient presents with   Follow-up    History of present illness: Saw Dr. Fuller Plan yesterday: she has had years of trouble with bowels.  She is hoping to get some improvement with the new bowel regimen.  Last visit with me was: 11/10/20 - virtual. We discussed recent fall in bathroom at that time with extensive echymosis/pain of her right breast.  She is frustrated with ongoing back pain.  She saw Ortho, but ended up having to have numerous visits before she was finally able to be scheduled for an MRI, which is happening next week.  She really just wants an injection so that she can feel better.  She is currently taking tramadol and gabapentin, which does help with her pain, but she starts to get very uncomfortable as these medications are wearing out of her system.  She has difficulty with changes in position, getting up and down out of chairs.  Pain is severe when it occurs and she feels it across the lower back and down her left leg.  Allergies  Allergen Reactions   Biaxin [Clarithromycin] Other (See Comments)    BREASTS INFLAMED   Codeine Other (See Comments)    PANIC ATTACKS, CAN'T SLEEP   Decongestant [Pseudoephedrine Hcl Er] Other (See Comments)    Cannot take with Dilitiazem   Phenylephrine Other (See Comments)    Cannot take with Dilitiazem   Rosuvastatin Nausea And Vomiting and Other (See Comments)   Acetaminophen Other (See Comments)    Triggered her "heart problem". Referring to atrial fibrillation   Current Meds  Medication Sig   AMBULATORY NON FORMULARY MEDICATION Medication Name: Osteo Matrix-Shaklee once daily   Ascorbic Acid (VITAMIN C PO) Take 1,000 mg by mouth 2 (two) times daily with a meal.   aspirin EC 81 MG tablet Take 81 mg by mouth See admin instructions. Take one tablet (81 mg) by mouth every morning, may also take one tablet (81 mg)  at night as needed for pain/sleep   Cetirizine HCl (WAL-ZYR PO) Take 1 tablet by mouth as needed.   Cyanocobalamin (VITAMIN B-12 PO) Take 130 mg by mouth daily with supper.   dicyclomine (BENTYL) 10 MG capsule TAKE 1 CAPSULE BY MOUTH FOUR TIMES DAILY BEFORE MEALS AND AT BEDTIME   diltiazem (CARDIZEM CD) 120 MG 24 hr capsule Take 1 capsule (120 mg total) by mouth daily.   gabapentin (NEURONTIN) 300 MG capsule Take 1 capsule one-three times a day   ibuprofen (ADVIL) 200 MG tablet Take 200 mg by mouth as needed.   loperamide (IMODIUM) 2 MG capsule Take 2 mg by mouth daily as needed for diarrhea or loose stools.    metroNIDAZOLE (METROGEL) 0.75 % gel Apply 1 application topically every morning. Applies to face for rosacea   Multiple Vitamins-Minerals (OCUVITE PO) Take 1 tablet by mouth at bedtime.   traMADol (ULTRAM) 50 MG tablet Take 50 mg by mouth every 6 (six) hours as needed.   triamcinolone (NASACORT) 55 MCG/ACT AERO nasal inhaler Place 2 sprays into the nose daily as needed (congestion/sinus headache).    VITAMIN D PO Take 1,000 Units by mouth daily with breakfast.   Wheat Dextrin (BENEFIBER DRINK MIX PO) Take 15 mLs by mouth 2 (two) times daily.    Current Facility-Administered Medications for the 12/22/20 encounter (Office Visit) with Caren Macadam, MD  Medication   0.9 %  sodium  chloride infusion    Review of Systems  Constitutional: Negative for chills, fatigue and fever.  Respiratory: Negative for cough, chest tightness, shortness of breath and wheezing.   Cardiovascular: Negative for chest pain, palpitations and leg swelling.  Musculoskeletal: Positive for back pain and gait problem.    Objective:  BP 100/60 (BP Location: Left Arm, Patient Position: Sitting, Cuff Size: Normal)    Pulse 74    Temp 98.3 F (36.8 C) (Oral)    Ht 4\' 10"  (1.473 m)    Wt 134 lb 6.4 oz (61 kg)    BMI 28.09 kg/m   Weight: 134 lb 6.4 oz (61 kg)   BP Readings from Last 3 Encounters:   12/22/20 100/60  12/21/20 110/62  01/30/20 122/75   Wt Readings from Last 3 Encounters:  12/22/20 134 lb 6.4 oz (61 kg)  12/21/20 135 lb (61.2 kg)  01/30/20 130 lb 1.1 oz (59 kg)    Physical Exam Constitutional:      General: She is not in acute distress.    Appearance: She is well-developed.  Cardiovascular:     Rate and Rhythm: Normal rate and regular rhythm.     Heart sounds: Normal heart sounds. No murmur heard. No friction rub.  Pulmonary:     Effort: Pulmonary effort is normal. No respiratory distress.     Breath sounds: Normal breath sounds. No wheezing or rales.  Chest:       Comments: Circled area of right breast above demonstrates area where there is some resolving ecchymosis.  Very faint yellow to brown color.  Underlying this area, there is approximately 4 cm slightly mobile mass, that is not tender to palpation.  She states that this has decreased from prior size.  Otherwise normal breast exam bilaterally.  No noted lymphadenopathy.  No other skin changes. Musculoskeletal:     Right lower leg: No edema.     Left lower leg: No edema.     Comments: She has difficulty with getting up and down off the exam table.  She has pain with lying back and sitting up on the exam table.  She has significant amount of paralumbar spasm and some SI tenderness to palpation.  Full range of motion of the back was not evaluated since she is under specialty care for this.  Feet:     Comments: Normal bilateral monofilament exam.  Slight decreased sensation over callus of heels bilaterally, but she is still able to sense monofilament. Neurological:     Mental Status: She is alert and oriented to person, place, and time.  Psychiatric:        Behavior: Behavior normal.     Assessment/Plan  1. Controlled type 2 diabetes mellitus without complication, without long-term current use of insulin ([place]) We will recheck blood work.  Sugars been diet controlled. - Hemoglobin A1c; Future - HM  DIABETES FOOT EXAM; Future - Microalbumin / creatinine urine ratio; Future - Microalbumin / creatinine urine ratio - Hemoglobin A1c  2. PAF (paroxysmal atrial fibrillation) (HCC) Heart rate controlled today.  Continue with diltiazem 120 mg daily.  3. Aortic atherosclerosis ([place]) Noted incidentally on imaging.  Make sure to monitor blood pressure and cholesterol control.  4. History of IBS - followed by [facility] She is following regularly with GI.  Most recent change was scheduling dosage of dicyclomine. - CBC with Differential/Platelet; Future - CBC with Differential/Platelet  5. Vitamin D deficiency - VITAMIN D 25 Hydroxy (Vit-D Deficiency, Fractures); Future - VITAMIN D  25 Hydroxy (Vit-D Deficiency, Fractures)  6. B12 deficiency - Vitamin B12; Future - Vitamin B12  7. Hyperlipidemia associated with type 2 diabetes mellitus ([place])  - Comprehensive metabolic panel; Future - Lipid panel; Future - Lipid panel - Comprehensive metabolic panel  8. Acute left-sided low back pain with left-sided sciatica She is following with specialist.  She is able to get relief with current medications.  I encouraged her to call the office as she was expecting that at her follow-up visit she would have an injection, but I suspect follow-up visit after imaging will be to review imaging and then discuss potential injection.  She is very tearful and wants to get relief as soon as possible, so I think will be best for her to call their office and to fully understand what the next step is in her follow-up.  9.  Traumatic hematoma right breast I do not have her most recent breast imaging, but she states that she had mammogram and ultrasound done in the fall 2021.  We are requesting these results today.  I do feel that the current mass in her breast is secondary to trauma.  Per patient, size of this has improved.  I will determine if any sooner follow-up as needed for breast evaluation pending review of most  recent imaging studies.   Return for Pending labs.    Micheline Rough, MD

## 2020-12-30 DIAGNOSIS — M5459 Other low back pain: Secondary | ICD-10-CM | POA: Diagnosis not present

## 2021-01-07 DIAGNOSIS — M5459 Other low back pain: Secondary | ICD-10-CM | POA: Diagnosis not present

## 2021-02-01 DIAGNOSIS — M5416 Radiculopathy, lumbar region: Secondary | ICD-10-CM | POA: Diagnosis not present

## 2021-02-16 DIAGNOSIS — M5416 Radiculopathy, lumbar region: Secondary | ICD-10-CM | POA: Diagnosis not present

## 2021-04-06 ENCOUNTER — Ambulatory Visit: Payer: Medicare Other | Attending: Internal Medicine

## 2021-04-06 DIAGNOSIS — Z23 Encounter for immunization: Secondary | ICD-10-CM

## 2021-04-06 NOTE — Progress Notes (Signed)
   Covid-19 Vaccination Clinic  Name:  TINLEIGH WHITMIRE    MRN: [id number] DOB: 16-Nov-1940  04/06/2021  Ms. Henrikson was observed post Covid-19 immunization for 15 minutes without incident. She was provided with Vaccine Information Sheet and instruction to access the V-Safe system.   Ms. Goodhart was instructed to call 911 with any severe reactions post vaccine: Marland Kitchen Difficulty breathing  . Swelling of face and throat  . A fast heartbeat  . A bad rash all over body  . Dizziness and weakness   Immunizations Administered    Name Date Dose VIS Date Route   PFIZER Comrnaty(Gray TOP) Covid-19 Vaccine 04/06/2021 11:15 AM 0.3 mL 10/07/2020 Intramuscular   Manufacturer: [employer], [employer]   Lot: [id number]   NDC: [phone]

## 2021-04-12 ENCOUNTER — Other Ambulatory Visit (HOSPITAL_COMMUNITY): Payer: Self-pay

## 2021-04-12 MED ORDER — COVID-19 MRNA VAC-TRIS(PFIZER) 30 MCG/0.3ML IM SUSP
INTRAMUSCULAR | 0 refills | Status: DC
  Filled 2021-04-12: qty 0.3, 17d supply, fill #0

## 2021-04-13 ENCOUNTER — Other Ambulatory Visit (HOSPITAL_COMMUNITY): Payer: Self-pay

## 2021-04-20 DIAGNOSIS — M25552 Pain in left hip: Secondary | ICD-10-CM | POA: Diagnosis not present

## 2021-04-21 ENCOUNTER — Other Ambulatory Visit (HOSPITAL_COMMUNITY): Payer: Self-pay

## 2021-04-23 DIAGNOSIS — M48062 Spinal stenosis, lumbar region with neurogenic claudication: Secondary | ICD-10-CM | POA: Diagnosis not present

## 2021-04-23 DIAGNOSIS — M5416 Radiculopathy, lumbar region: Secondary | ICD-10-CM | POA: Diagnosis not present

## 2021-05-18 DIAGNOSIS — M5416 Radiculopathy, lumbar region: Secondary | ICD-10-CM | POA: Diagnosis not present

## 2021-05-24 ENCOUNTER — Telehealth: Payer: Self-pay | Admitting: Family Medicine

## 2021-05-24 NOTE — Telephone Encounter (Signed)
[facility] called to get a referral for the patient to have a diagnostic ultrasound of the right breast for pain and tenderness.  Also a bilateral mammogram at the same time  ATTN: Janett Billow Fax: [phone]

## 2021-05-25 ENCOUNTER — Encounter: Payer: Self-pay | Admitting: Family Medicine

## 2021-05-25 DIAGNOSIS — N644 Mastodynia: Secondary | ICD-10-CM | POA: Diagnosis not present

## 2021-05-25 LAB — HM MAMMOGRAPHY

## 2021-05-25 NOTE — Telephone Encounter (Signed)
Order signed and verbal given to [place].

## 2021-06-08 ENCOUNTER — Encounter: Payer: Self-pay | Admitting: Family Medicine

## 2021-07-19 ENCOUNTER — Ambulatory Visit: Payer: Medicare Other

## 2021-07-26 ENCOUNTER — Other Ambulatory Visit (HOSPITAL_BASED_OUTPATIENT_CLINIC_OR_DEPARTMENT_OTHER): Payer: Self-pay

## 2021-07-27 ENCOUNTER — Ambulatory Visit (INDEPENDENT_AMBULATORY_CARE_PROVIDER_SITE_OTHER): Payer: Medicare Other | Admitting: Family Medicine

## 2021-07-27 ENCOUNTER — Telehealth: Payer: Self-pay | Admitting: Family Medicine

## 2021-07-27 ENCOUNTER — Other Ambulatory Visit: Payer: Self-pay

## 2021-07-27 ENCOUNTER — Encounter: Payer: Self-pay | Admitting: Family Medicine

## 2021-07-27 VITALS — BP 118/62 | HR 69 | Temp 98.0°F | Ht <= 58 in | Wt 130.2 lb

## 2021-07-27 DIAGNOSIS — M545 Low back pain, unspecified: Secondary | ICD-10-CM

## 2021-07-27 DIAGNOSIS — Z23 Encounter for immunization: Secondary | ICD-10-CM

## 2021-07-27 DIAGNOSIS — E119 Type 2 diabetes mellitus without complications: Secondary | ICD-10-CM | POA: Diagnosis not present

## 2021-07-27 DIAGNOSIS — Z13220 Encounter for screening for lipoid disorders: Secondary | ICD-10-CM

## 2021-07-27 DIAGNOSIS — I480 Paroxysmal atrial fibrillation: Secondary | ICD-10-CM

## 2021-07-27 DIAGNOSIS — G8929 Other chronic pain: Secondary | ICD-10-CM | POA: Diagnosis not present

## 2021-07-27 LAB — COMPREHENSIVE METABOLIC PANEL
ALT: 14 U/L (ref 0–35)
AST: 14 U/L (ref 0–37)
Albumin: 4.5 g/dL (ref 3.5–5.2)
Alkaline Phosphatase: 68 U/L (ref 39–117)
BUN: 17 mg/dL (ref 6–23)
CO2: 25 mEq/L (ref 19–32)
Calcium: 10.1 mg/dL (ref 8.4–10.5)
Chloride: 104 mEq/L (ref 96–112)
Creatinine, Ser: 0.63 mg/dL (ref 0.40–1.20)
GFR: 83.56 mL/min (ref >60.00)
Glucose, Bld: 105 mg/dL — ABNORMAL HIGH (ref 70–99)
Potassium: 4.3 mEq/L (ref 3.5–5.1)
Sodium: 139 mEq/L (ref 135–145)
Total Bilirubin: 0.3 mg/dL (ref 0.2–1.2)
Total Protein: 7.4 g/dL (ref 6.0–8.3)

## 2021-07-27 LAB — LIPID PANEL
Cholesterol: 149 mg/dL (ref 0–200)
HDL: 42.9 mg/dL (ref >39.00)
LDL Cholesterol: 81 mg/dL (ref 0–99)
NonHDL: 106.12
Total CHOL/HDL Ratio: 3
Triglycerides: 126 mg/dL (ref 0.0–149.0)
VLDL: 25.2 mg/dL (ref 0.0–40.0)

## 2021-07-27 LAB — CBC WITH DIFFERENTIAL/PLATELET
Basophils Absolute: 0.1 10*3/uL (ref 0.0–0.1)
Basophils Relative: 0.6 % (ref 0.0–3.0)
Eosinophils Absolute: 0.1 10*3/uL (ref 0.0–0.7)
Eosinophils Relative: 0.6 % (ref 0.0–5.0)
HCT: 39.7 % (ref 36.0–46.0)
Hemoglobin: 13.1 g/dL (ref 12.0–15.0)
Lymphocytes Relative: 20.2 % (ref 12.0–46.0)
Lymphs Abs: 1.7 10*3/uL (ref 0.7–4.0)
MCHC: 33 g/dL (ref 30.0–36.0)
MCV: 86.7 fl (ref 78.0–100.0)
Monocytes Absolute: 0.7 10*3/uL (ref 0.1–1.0)
Monocytes Relative: 8.9 % (ref 3.0–12.0)
Neutro Abs: 5.7 10*3/uL (ref 1.4–7.7)
Neutrophils Relative %: 69.7 % (ref 43.0–77.0)
Platelets: 289 10*3/uL (ref 150.0–400.0)
RBC: 4.58 Mil/uL (ref 3.87–5.11)
RDW: 14.4 % (ref 11.5–15.5)
WBC: 8.2 10*3/uL (ref 4.0–10.5)

## 2021-07-27 LAB — HEMOGLOBIN A1C: Hgb A1c MFr Bld: 6.6 % — ABNORMAL HIGH (ref 4.6–6.5)

## 2021-07-27 MED ORDER — HYDROCODONE-IBUPROFEN 5-200 MG PO TABS: 1.0000 | ORAL_TABLET | Freq: Every day | ORAL | 0 refills | Status: DC | PRN

## 2021-07-27 NOTE — Telephone Encounter (Signed)
Patient called again to give [facility]. States all prescriptions need to be sent there because [facility] not [facility] it.

## 2021-07-27 NOTE — Progress Notes (Signed)
Angel Holland DOB: Dec 13, 1940 Encounter date: 07/27/2021  This is a 80 y.o. female who presents with Chief Complaint  Patient presents with   Medication Problem    Patient requests to discuss refills for Diltiazem CD 120mg  as she was told by Dr Kennon Holter nurse he would prefer she contact PCP for refills    History of present illness: Last visit with me was 11/2020.  Type 2 diabetes: A1c in February was 6.8.  Hyperlipidemia:  History of palpitations with episode of paroxysmal atrial fibrillation noted on event monitor in the past: Has followed with cardiology, rate controlled on diltiazem.  Has declined oral anticoagulation through cardiology in the past. When her heart started racing she was taking tylenol arthritis 8 hour pill and was taking this to sleep at night and thinks this started her heart racing episode. Hasn't had issues with racing heart since then.   Last year was sitting in sink trying to clean spiders and got back out of whack. Has been seeing Dr. Nelva Bush for back. Took hydrocodone with acetaminophen and throat started hurting again. Just thinks she doesn't tolerate the tylenol very well. Hasn't felt heart racing. Usually will just take once daily pain medication; feels she did better with the ibuprofen component. Has felt better in last 2 months; just think that time has helped. Before she had medication she would literally scream in pain any time she had to get up out of bed. Pain medication in evening helps her so she doesn't wake in pain through the night. When she first saw Dr. Nelva Bush she was in a wheel chair. She didn't get relief with tramadol or gabapentin. She has stopped these.   Takes cardizem in evening. No dizziness or light headedness.   She did have metallic taste in mouth after covid shot. Had arm pain x 2 days. Otherwise did well.   She has had high cholesterol in past, but has been diet controlled. She did not tolerate crestor.      Allergies  Allergen  Reactions   Biaxin [Clarithromycin] Other (See Comments)    BREASTS INFLAMED   Codeine Other (See Comments)    PANIC ATTACKS, CAN'T SLEEP   Decongestant [Pseudoephedrine Hcl Er] Other (See Comments)    Cannot take with Dilitiazem   Phenylephrine Other (See Comments)    Cannot take with Dilitiazem   Rosuvastatin Nausea And Vomiting and Other (See Comments)   Acetaminophen Other (See Comments)    Triggered her "heart problem". Referring to atrial fibrillation   Current Meds  Medication Sig   AMBULATORY NON FORMULARY MEDICATION Medication Name: Osteo Matrix-Shaklee once daily   Ascorbic Acid (VITAMIN C PO) Take 1,000 mg by mouth 2 (two) times daily with a meal.   aspirin EC 81 MG tablet Take 81 mg by mouth See admin instructions. Take one tablet (81 mg) by mouth every morning, may also take one tablet (81 mg) at night as needed for pain/sleep   COVID-19 mRNA Vac-TriS, Pfizer, SUSP injection Inject into the muscle.   Cyanocobalamin (VITAMIN B-12 PO) Take 130 mg by mouth daily with supper.   dicyclomine (BENTYL) 10 MG capsule TAKE 1 CAPSULE BY MOUTH FOUR TIMES DAILY BEFORE MEALS AND AT BEDTIME   diltiazem (CARDIZEM CD) 120 MG 24 hr capsule Take 1 capsule (120 mg total) by mouth daily.   ibuprofen (ADVIL) 200 MG tablet Take 200 mg by mouth as needed.   loperamide (IMODIUM) 2 MG capsule Take 2 mg by mouth daily as needed for diarrhea  or loose stools.    metroNIDAZOLE (METROGEL) 0.75 % gel Apply 1 application topically every morning. Applies to face for rosacea   Multiple Vitamins-Minerals (OCUVITE PO) Take 1 tablet by mouth at bedtime.   triamcinolone (NASACORT) 55 MCG/ACT AERO nasal inhaler Place 2 sprays into the nose daily as needed (congestion/sinus headache).    VITAMIN D PO Take 1,000 Units by mouth daily with breakfast.   Wheat Dextrin (BENEFIBER DRINK MIX PO) Take 15 mLs by mouth 2 (two) times daily.    [DISCONTINUED] HYDROcodone-acetaminophen (NORCO/VICODIN) 5-325 MG tablet Take 1  tablet by mouth every 6 (six) hours.   Current Facility-Administered Medications for the 07/27/21 encounter (Office Visit) with Caren Macadam, MD  Medication   0.9 %  sodium chloride infusion    Review of Systems  Constitutional:  Negative for chills, fatigue and fever.  Respiratory:  Negative for cough, chest tightness, shortness of breath and wheezing.   Cardiovascular:  Negative for chest pain, palpitations and leg swelling.  Musculoskeletal:  Positive for back pain.  Psychiatric/Behavioral:         She states that mood is always been depressed.  She had a hard life growing up, and just has never had a great mood.  She enjoys her granddaughter, and feels that seeing her granddaughter and watching her success helps her to keep going.  She does enjoy going to church and some people there.  She has lost a pet, her husband, and her best friend all in the last year.  She does still talk regularly with her best friend's husband.  She has friends from church that check in on her regularly.   Objective:  BP 118/62 (BP Location: Left Arm, Patient Position: Sitting, Cuff Size: Normal)   Pulse 69   Temp 98 F (36.7 C) (Oral)   Ht 4\' 10"  (1.473 m)   Wt 130 lb 3.2 oz (59.1 kg)   SpO2 95%   BMI 27.21 kg/m   Weight: 130 lb 3.2 oz (59.1 kg)   BP Readings from Last 3 Encounters:  07/27/21 118/62  12/22/20 100/60  12/21/20 110/62   Wt Readings from Last 3 Encounters:  07/27/21 130 lb 3.2 oz (59.1 kg)  12/22/20 134 lb 6.4 oz (61 kg)  12/21/20 135 lb (61.2 kg)    Physical Exam Constitutional:      General: She is not in acute distress.    Appearance: She is well-developed.  Cardiovascular:     Rate and Rhythm: Normal rate and regular rhythm.     Heart sounds: Normal heart sounds. No murmur heard.   No friction rub.  Pulmonary:     Effort: Pulmonary effort is normal. No respiratory distress.     Breath sounds: Normal breath sounds. No wheezing or rales.  Musculoskeletal:      Right lower leg: No edema.     Left lower leg: No edema.  Neurological:     Mental Status: She is alert and oriented to person, place, and time.  Psychiatric:        Behavior: Behavior normal.     Comments: Her mood is somewhat sad, but on further talking, she feels that this is pretty chronic for her.  She does not want to be on medications or therapy.  She does feel that her granddaughter being in her life helps to keep her more positive.  She is estranged from her sons.    Assessment/Plan 1. Controlled type 2 diabetes mellitus without complication, without long-term current use  of insulin ([place]) [place] lab work today.  Blood sugars have been diet controlled. - Comprehensive metabolic panel; Future - Hemoglobin A1c; Future - HM DIABETES FOOT EXAM - Hemoglobin A1c - Comprehensive metabolic panel  2. Chronic midline low back pain without sciatica She has a hard time tolerating Tylenol.  She does better with the Vicoprofen.  She does not take this daily.  I am going to send this to the pharmacy for her and I am okay with refilling this to use if needed in the future.  She is still following with Ortho regularly and has upcoming visit. - hydrocodone-ibuprofen (VICOPROFEN) 5-200 MG tablet; Take 1 tablet by mouth daily as needed for pain.  Dispense: 30 tablet; Refill: 0  3. PAF (paroxysmal atrial fibrillation) (HCC) Rate has been controlled.  She has declined anticoagulation in the past for cardiology.  Continue on diltiazem 120 mg daily.  Encouraged her to check blood pressures at home since her pressure was somewhat low here today.  We may be able to decrease dose of medication pending home readings. - CBC with Differential/Platelet; Future - CBC with Differential/Platelet  4. Lipid screening - Lipid panel; Future - Lipid panel  5. Need for immunization against influenza - Flu Vaccine QUAD High Dose(Fluad)  Return in about 6 months (around 01/24/2022) for Chronic condition  visit.     Micheline Rough, MD

## 2021-07-27 NOTE — Telephone Encounter (Signed)
Sorry! I know this and have a note but didn't select right pharmacy. [facility]

## 2021-07-27 NOTE — Telephone Encounter (Signed)
Patient would like the prescriptions she had refilled today during her visit [facility] [address] [place], [place] [address].  [facility] states that [facility] not [facility].  Please advise.

## 2021-07-27 NOTE — Telephone Encounter (Signed)
Spoke with the patient and informed her the Rx for Hydrocodone[facility]buprofen [facility] Ethlyn Gallery.

## 2021-08-01 ENCOUNTER — Other Ambulatory Visit: Payer: Self-pay

## 2021-08-01 ENCOUNTER — Other Ambulatory Visit (HOSPITAL_BASED_OUTPATIENT_CLINIC_OR_DEPARTMENT_OTHER): Payer: Self-pay

## 2021-08-01 ENCOUNTER — Ambulatory Visit: Payer: Medicare Other | Attending: Internal Medicine

## 2021-08-01 DIAGNOSIS — Z23 Encounter for immunization: Secondary | ICD-10-CM

## 2021-08-01 MED ORDER — PFIZER COVID-19 VAC BIVALENT 30 MCG/0.3ML IM SUSP
INTRAMUSCULAR | 0 refills | Status: DC
  Filled 2021-08-01: qty 0.3, 1d supply, fill #0

## 2021-08-01 NOTE — Progress Notes (Signed)
   Covid-19 Vaccination Clinic  Name:  Angel Holland    MRN: [id number] DOB: 09-04-1941  08/01/2021  [name] was observed post Covid-19 immunization for 15 minutes without incident. She was provided with Vaccine Information Sheet and instruction to access the V-Safe system.   [name] was instructed to call 911 with any severe reactions post vaccine: Difficulty breathing  Swelling of face and throat  A fast heartbeat  A bad rash all over body  Dizziness and weakness

## 2021-08-08 DIAGNOSIS — Z5181 Encounter for therapeutic drug level monitoring: Secondary | ICD-10-CM | POA: Diagnosis not present

## 2021-08-08 DIAGNOSIS — M5459 Other low back pain: Secondary | ICD-10-CM | POA: Diagnosis not present

## 2021-08-08 DIAGNOSIS — Z79891 Long term (current) use of opiate analgesic: Secondary | ICD-10-CM | POA: Diagnosis not present

## 2021-08-30 ENCOUNTER — Telehealth: Payer: Self-pay | Admitting: Family Medicine

## 2021-08-30 DIAGNOSIS — M545 Low back pain, unspecified: Secondary | ICD-10-CM

## 2021-08-30 DIAGNOSIS — G8929 Other chronic pain: Secondary | ICD-10-CM

## 2021-08-30 NOTE — Telephone Encounter (Signed)
Patient called to get refill on hydrocodone-ibuprofen (VICOPROFEN) 5-200 MG tablet     Please send to [facility], [address] [facility] [address] Phone:  [phone]  Fax:  [phone]             Please advise

## 2021-08-30 NOTE — Telephone Encounter (Signed)
She is getting hydrocodone from Dr Nelva Bush.  I do not recommend she get opioids from multiple providers.  She will need to discuss this further with Dr Ethlyn Gallery.

## 2021-08-31 NOTE — Telephone Encounter (Signed)
Patient informed of the message below and advised she contact Dr Jeralyn Ruths office for refills.

## 2021-09-02 ENCOUNTER — Other Ambulatory Visit: Payer: Self-pay | Admitting: Cardiovascular Disease

## 2021-09-19 DIAGNOSIS — D1801 Hemangioma of skin and subcutaneous tissue: Secondary | ICD-10-CM | POA: Diagnosis not present

## 2021-09-19 DIAGNOSIS — D485 Neoplasm of uncertain behavior of skin: Secondary | ICD-10-CM | POA: Diagnosis not present

## 2021-09-19 DIAGNOSIS — L718 Other rosacea: Secondary | ICD-10-CM | POA: Diagnosis not present

## 2021-09-19 DIAGNOSIS — L820 Inflamed seborrheic keratosis: Secondary | ICD-10-CM | POA: Diagnosis not present

## 2021-09-19 DIAGNOSIS — L570 Actinic keratosis: Secondary | ICD-10-CM | POA: Diagnosis not present

## 2021-09-19 DIAGNOSIS — Z85828 Personal history of other malignant neoplasm of skin: Secondary | ICD-10-CM | POA: Diagnosis not present

## 2021-09-19 DIAGNOSIS — L814 Other melanin hyperpigmentation: Secondary | ICD-10-CM | POA: Diagnosis not present

## 2021-09-19 DIAGNOSIS — L821 Other seborrheic keratosis: Secondary | ICD-10-CM | POA: Diagnosis not present

## 2021-09-19 DIAGNOSIS — C44719 Basal cell carcinoma of skin of left lower limb, including hip: Secondary | ICD-10-CM | POA: Diagnosis not present

## 2021-09-29 DIAGNOSIS — C44719 Basal cell carcinoma of skin of left lower limb, including hip: Secondary | ICD-10-CM | POA: Diagnosis not present

## 2021-11-03 DIAGNOSIS — M545 Low back pain, unspecified: Secondary | ICD-10-CM | POA: Diagnosis not present

## 2021-12-05 ENCOUNTER — Ambulatory Visit (INDEPENDENT_AMBULATORY_CARE_PROVIDER_SITE_OTHER): Payer: Medicare Other

## 2021-12-05 VITALS — BP 122/64 | HR 62 | Temp 98.8°F | Ht <= 58 in | Wt 130.1 lb

## 2021-12-05 DIAGNOSIS — Z0000 Encounter for general adult medical examination without abnormal findings: Secondary | ICD-10-CM | POA: Diagnosis not present

## 2021-12-05 NOTE — Progress Notes (Signed)
Subjective:   Angel Holland is a 81 y.o. female who presents for Medicare Annual (Subsequent) preventive examination.  Review of Systems     Cardiac Risk Factors include: advanced age (>31men, >46 women);diabetes mellitus     Objective:    Today's Vitals   12/05/21 1429  BP: 122/64  Pulse: 62  Temp: 98.8 F (37.1 C)  TempSrc: Oral  SpO2: 96%  Weight: 130 lb 1.6 oz (59 kg)  Height: 4\' 10"  (1.473 m)   Body mass index is 27.19 kg/m.  Advanced Directives 12/05/2021 12/02/2020 01/30/2020 12/01/2019 12/19/2017 09/09/2015 07/12/2015  Does Patient Have a Medical Advance Directive? Yes Yes No Yes Yes Yes Yes  Type of Paramedic of [place];Living will [place];Living will - [place];Living will [place];Living will Living will;Healthcare Power of Attorney Living will  Does patient want to make changes to medical advance directive? No - Patient declined No - Patient declined - No - Patient declined - No - Patient declined No - Patient declined  Copy of [place] in Chart? No - copy requested No - copy requested - No - copy requested No - copy requested No - copy requested No - copy requested  Would patient like information on creating a medical advance directive? - - No - Patient declined - - - -    Current Medications (verified) Outpatient Encounter Medications as of 12/05/2021  Medication Sig   AMBULATORY NON FORMULARY MEDICATION Medication Name: Osteo Matrix-[place]ak[place] once daily   Ascorbic Acid (VITAMIN C PO) Take 1,000 mg by mouth 2 (two) times daily with a meal.   aspirin EC 81 MG tablet Take 81 mg by mouth See admin instructions. Take one tablet (81 mg) by mouth every morning, may also take one tablet (81 mg) at night as needed for pain/sleep   Cyanocobalamin (VITAMIN B-12 PO) Take 130 mg by mouth daily with supper.   dicyclomine (BENTYL) 10 MG capsule TAKE 1 CAPSULE BY MOUTH FOUR TIMES  DAILY BEFORE MEALS AND AT BEDTIME   diltiazem (CARDIZEM CD) 120 MG 24 hr capsule TAKE 1 CAPSULE(120 MG) BY MOUTH DAILY   hydrocodone-ibuprofen (VICOPROFEN) 5-200 MG tablet Take 1 tablet by mouth daily as needed for pain.   ibuprofen (ADVIL) 200 MG tablet Take 200 mg by mouth as needed.   loperamide (IMODIUM) 2 MG capsule Take 2 mg by mouth daily as needed for diarrhea or loose stools.    metroNIDAZOLE (METROGEL) 0.75 % gel Apply 1 application topically every morning. Applies to face for rosacea   Multiple Vitamins-Minerals (OCUVITE PO) Take 1 tablet by mouth at bedtime.   triamcinolone (NASACORT) 55 MCG/ACT AERO nasal inhaler Place 2 sprays into the nose daily as needed (congestion/sinus headache).    VITAMIN D PO Take 1,000 Units by mouth daily with breakfast.   Wheat Dextrin (BENEFIBER DRINK MIX PO) Take 15 mLs by mouth 2 (two) times daily.    COVID-19 mRNA bivalent vaccine, Pfizer, (PFIZER COVID-19 VAC BIVALENT) injection Inject into the muscle.   COVID-19 mRNA Vac-TriS, Pfizer, SUSP injection Inject into the muscle.   Facility-Administered Encounter Medications as of 12/05/2021  Medication   0.9 %  sodium chloride infusion    Allergies (verified) Biaxin [clarithromycin], Codeine, Decongestant [pseudoephedrine hcl er], Phenylephrine, Rosuvastatin, and Acetaminophen   History: Past Medical History:  Diagnosis Date   Anxiety    no meds   Arthritis    back and fingers    Bruxism  Cataract    baby cataracts per pt.   Colon polyps    DDD (degenerative disc disease), lumbar    DDD (degenerative disc disease), lumbosacral    Depression    no meds   Diverticulosis of colon (without mention of hemorrhage)    Fibroid uterus    Headache, paroxysmal hemicrania, episodic 03/24/2014   Hemorrhoids    History of D&C    x2   History of night sweats    History of rectal bleeding    08/2017   Hx of diverticulitis of colon 03/30/2014   Hyperglycemia    Irritable bowel syndrome    PAF  (paroxysmal atrial fibrillation) ([place]) 01/2018   pt declined [place]   Pulmonary nodules - reports followed by pulmonology, reports following up with them in June 2015 03/26/2013   Repeat CT chest 04/20/14 > 1. Scattered pulmonary nodules measure 4 mm or less in size, are unchanged from 03/28/2013 and are therefore considered benign     Tobacco abuse    Unspecified hemorrhoids without mention of complication    Past Surgical History:  Procedure Laterality Date   APPENDECTOMY  1957   bce     CATARACT EXTRACTION     Left June 2021-Right 05/2020   CATARACT EXTRACTION, BILATERAL     CHOLECYSTECTOMY  2009   DILATION AND CURETTAGE OF UTERUS     fibroid removed from uterus     HYSTEROSCOPY WITH D & C N/A 09/09/2015   Procedure: DILATATION AND CURETTAGE /HYSTEROSCOPY;  Surgeon: Vanessa Kick, MD;  Location: [facility];  Service: Gynecology;  Laterality: N/A;   Family History  Problem Relation Age of Onset   Heart disease Mother        CHF   Diverticulosis Mother    Bone cancer Father        found in his mouth first   Colon cancer Neg Hx    Social History   Socioeconomic History   Marital status: Married    Spouse name: Not on file   Number of children: 2   Years of education: Not on file   Highest education level: Some college, no degree  Occupational History   Occupation: retired    Comment: Press photographer  Tobacco Use   Smoking status: Former    Packs/day: 1.00    Years: 50.00    Pack years: 50.00    Types: Cigarettes    Quit date: 07/23/2008    Years since quitting: 13.3   Smokeless tobacco: Never  Vaping Use   Vaping Use: Never used  Substance and Sexual Activity   Alcohol use: No   Drug use: No   Sexual activity: Not Currently  Other Topics Concern   Not on file  Social History Narrative   Work or School:  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      Home Situation: lives with husband      Spiritual Beliefs: Christian      Lifestyle: walking  daily ; very poor       2 sons; 4 grandchildren       1 dog         Social Determinants of Radio broadcast assistant Strain: Low Risk    Difficulty of Paying Living Expenses: Not hard at all  Food Insecurity: No Food Insecurity   Worried About Charity fundraiser in the Last Year: Never true   [place] in the Last Year: Never  true  Transportation Needs: No Transportation Needs   Lack of Transportation (Medical): No   Lack of Transportation (Non-Medical): No  Physical Activity: Sufficiently Active   Days of Exercise per Week: 5 days   Minutes of Exercise per Session: 30 min  Stress: No Stress Concern Present   Feeling of Stress : Not at all  Social Connections: Moderately Integrated   Frequency of Communication with Friends and Family: Once a week   Frequency of Social Gatherings with Friends and Family: More than three times a week   Attends Religious Services: More than 4 times per year   Active Member of [employer] or Organizations: Yes   Attends Archivist Meetings: More than 4 times per year   Marital Status: Widowed    Tobacco Counseling Counseling given: Not Answered   Clinical Intake:  Pre-visit preparation completed: Yes  Pain : No/denies pain     BMI - recorded: 27.22 Nutritional Status: BMI 25 -29 Overweight Nutritional Risks: None Diabetes: Yes CBG done?: No Did pt. bring in CBG monitor from home?: No  How often do you need to have someone help you when you read instructions, pamphlets, or other written materials from your doctor or pharmacy?: 1 - Never   Diabetic? Yes Nutrition Risk Assessment:  Has the patient had any N/V/D within the last 2 months?  No  Does the patient have any non-healing wounds?  No  Has the patient had any unintentional weight loss or weight gain?  No   Diabetes:  Is the patient diabetic?  Yes  If diabetic, was a CBG obtained today?  No  Did the patient bring in their glucometer from home?  No    Financial Strains and Diabetes Management:  Are you having any financial strains with the device, your supplies or your medication? No .  Does the patient want to be seen by Chronic Care Management for management of their diabetes?  No  Would the patient like to be referred to a Nutritionist or for Diabetic Management?  No   Diabetic Exams:  Diabetic Eye Exam: Completed Yes. Overdue for diabetic eye exam. Pt has been advised about the importance in completing this exam. A referral has been placed today. Message sent to referral coordinator for scheduling purposes. Advised pt to expect a call from office referred to regarding appt. Yes Diabetic Foot Exam: Completed Yes. Pt has been advised about the importance in completing this exam. Pt is scheduled for diabetic foot exam on Followed By PCP.   Interpreter Needed?: No   Activities of Daily Living In your present state of health, do you have any difficulty performing the following activities: 12/05/2021  Hearing? N  Vision? N  Difficulty concentrating or making decisions? N  Walking or climbing stairs? N  Dressing or bathing? N  Doing errands, shopping? N  Preparing Food and eating ? N  Using the Toilet? N  In the past six months, have you accidently leaked urine? N  Do you have problems with loss of bowel control? Y  Comment Wears pads Followed by Gastrologist  Managing your Medications? N  Managing your Finances? N  Housekeeping or managing your Housekeeping? N  Some recent data might be hidden    Patient Care Team: Caren Macadam, MD as PCP - General (Family Medicine) Sydnee Levans, MD as Referring Physician (Dermatology)  Indicate any recent Medical Services you may have received from other than [facility] providers in the past year (date may be approximate).  Assessment:   This is a routine wellness examination for Ad[place].  Hearing/Vision screen Hearing Screening - Comments:: No difficulty hearing Vision  Screening - Comments:: Wears glasses. Followed by [facility]  Dietary issues and exercise activities discussed: Current Exercise Habits: Home exercise routine, Type of exercise: walking, Time (Minutes): 30, Frequency (Times/Week): 3, Weekly Exercise (Minutes/Week): 90, Intensity: Mild, Exercise limited by: None identified   Goals Addressed             This Visit's Progress    Patient Stated       Decrease fecal incontinence and diarrhea.       Depression Screen PHQ 2/9 Scores 12/05/2021 12/02/2020 08/11/2019 07/22/2018 07/17/2017 07/13/2016 07/12/2015  PHQ - 2 Score 0 0 0 0 3 3 2   PHQ- 9 Score - 0 - - 11 - -    Fall Risk Fall Risk  12/05/2021 12/02/2020 12/01/2019 08/11/2019 07/22/2018  Falls in the past year? 0 1 0 0 Yes  Number falls in past yr: 0 0 - 0 1  Injury with Fall? 0 1 - 0 Yes  Comment - Bruised breast - - -  Risk for fall due to : No Fall Risks Medication side effect Medication side effect;Orthopedic patient - -  Follow up - Falls evaluation completed;Falls prevention discussed Falls evaluation completed;Education provided;Falls prevention discussed - -    FALL RISK PREVENTION PERTAINING TO THE HOME:  Any stairs in or around the home? No  If so, are there any without handrails? No  Home free of loose throw rugs in walkways, pet beds, electrical cords, etc? Yes  Adequate lighting in your home to reduce risk of falls? Yes   ASSISTIVE DEVICES UTILIZED TO PREVENT FALLS:  Life alert? No  Use of a cane, walker or w/c? Yes  Grab bars in the bathroom? No  Shower chair or bench in shower? Yes  Elevated toilet seat or a handicapped toilet? No   TIMED UP AND GO:  Was the test performed? Yes .  Length of time to ambulate 10 feet: 5  sec.   Gait steady and fast without use of assistive device  Cognitive Function:     6CIT Screen 12/05/2021 12/01/2019  What Year? 0 points 0 points  What month? 0 points 0 points  What time? 0 points 0 points  Count back from 20 0 points 0  points  Months in reverse 0 points 0 points  Repeat phrase 0 points 0 points  Total Score 0 0    Immunizations Immunization History  Administered Date(s) Administered   Fluad Quad(high Dose 65+) 07/31/2019, 07/21/2020, 07/27/2021   Influenza Split 06/30/2012   Influenza Whole 10/30/2006, 07/20/2010   Influenza, High Dose Seasonal PF 07/12/2015, 07/13/2016, 07/17/2017   Influenza,inj,Quad PF,6+ Mos 07/24/2013, 07/22/2018   Influenza,inj,quad, With Preservative 07/30/2020   Influenza-Unspecified 06/30/2014   PFIZER Comirnaty(Gray Top)Covid-19 Tri-Sucrose Vaccine 04/06/2021   PFIZER(Purple Top)SARS-COV-2 Vaccination 12/26/2019, 01/20/2020, 08/18/2020   Pfizer Covid-19 Vaccine Bivalent Booster 6yrs & up 08/01/2021   Pneumococcal Conjugate-13 07/12/2015   Pneumococcal Polysaccharide-23 10/30/2004, 07/24/2013   Td 10/30/2004   Tdap 11/02/2014   Zoster Recombinat (Shingrix) 09/22/2018   Zoster, Live 11/02/2014    TDAP status: Up to date  Flu Vaccine status: Up to date  Pneumococcal vaccine status: Up to date  Covid-19 vaccine status: Completed vaccines  Qualifies for Shingles Vaccine? Yes   Zostavax completed Yes   Shingrix Completed?: Yes  Screening Tests Health Maintenance  Topic Date Due   Zoster Vaccines- Shingrix (  2 of 2) 11/17/2018   OPHTHALMOLOGY EXAM  05/30/2021   URINE MICROALBUMIN  12/22/2021   DEXA SCAN  07/11/2025 (Originally 10/09/2005)   HEMOGLOBIN A1C  01/24/2022   MAMMOGRAM  05/25/2022   FOOT EXAM  07/27/2022   TETANUS/TDAP  11/02/2024   Pneumonia Vaccine 69+ Years old  Completed   INFLUENZA VACCINE  Completed   COVID-19 Vaccine  Completed   HPV VACCINES  Aged Out    Health Maintenance  Health Maintenance Due  Topic Date Due   Zoster Vaccines- Shingrix (2 of 2) 11/17/2018   OPHTHALMOLOGY EXAM  05/30/2021   URINE MICROALBUMIN  12/22/2021    Colorectal cancer screening: No longer required.   Mammogram status: No longer required due to  Age.    Additional Screening:  Hepatitis C Screening: does not qualify;   Vision Screening: Recommended annual ophthalmology exams for early detection of glaucoma and other disorders of the eye. Is the patient up to date wYes ith their annual eye exam?   Who is the provider or what is the name of the office in which the patient attends annual eye exams? [facility] If pt is not established with a provider, would they like to be referred to a provider to establish care? No .   Dental Screening: Recommended annual dental exams for proper oral hygiene  Community Resource Referral / Chronic Care Management:  CRR required this visit?  No   CCM required this visit?  No      Plan:     I have personally reviewed and noted the following in the patients chart:   Medical and social history Use of alcohol, tobacco or illicit drugs  Current medications and supplements including opioid prescriptions. Patient currently taking opioids Functional ability and status Nutritional status Physical activity Advanced directives List of other physicians Hospitalizations, surgeries, and ER visits in previous 12 months Vitals Screenings to include cognitive, depression, and falls Referrals and appointments  In addition, I have reviewed and discussed with patient certain preventive protocols, quality metrics, and best practice recommendations. A written personalized care plan for preventive services as well as general preventive health recommendations were provided to patient.     Criselda Peaches, LPN   05/07/2955   Nurse Notes: None

## 2021-12-05 NOTE — Patient Instructions (Addendum)
[name] , Thank you for taking time to come for your Medicare Wellness Visit. I appreciate your ongoing commitment to your health goals. Please review the following plan we discussed and let me know if I can assist you in the future.   These are the goals we discussed:  Goals      Increase physical activity     As you are able; this will help with your chronic back pain     Patient Stated     Decrease fecal incontinence and diarrhea.        This is a list of the screening recommended for you and due dates:  Health Maintenance  Topic Date Due   Zoster (Shingles) Vaccine (2 of 2) 11/17/2018   Eye exam for diabetics  05/30/2021   Urine Protein Check  12/22/2021   DEXA scan (bone density measurement)  07/11/2025*   Hemoglobin A1C  01/24/2022   Mammogram  05/25/2022   Complete foot exam   07/27/2022   Tetanus Vaccine  11/02/2024   Pneumonia Vaccine  Completed   Flu Shot  Completed   COVID-19 Vaccine  Completed   HPV Vaccine  Aged Out  *Topic was postponed. The date shown is not the original due date.   Opioid Pain Medicine Management Opioids are powerful medicines that are used to treat moderate to severe pain. When used for short periods of time, they can help you to: Sleep better. Do better in physical or occupational therapy. Feel better in the first few days after an injury. Recover from surgery. Opioids should be taken with the supervision of a trained health care provider. They should be taken for the shortest period of time possible. This is because opioids can be addictive, and the longer you take opioids, the greater your risk of addiction. This addiction can also be called opioid use disorder. What are the risks? Using opioid pain medicines for longer than 3 days increases your risk of side effects. Side effects include: Constipation. Nausea and vomiting. Breathing difficulties (respiratory depression). Drowsiness. Confusion. Opioid use  disorder. Itching. Taking opioid pain medicine for a long period of time can affect your ability to do daily tasks. It also puts you at risk for: Motor vehicle crashes. Depression. Suicide. Heart attack. Overdose, which can be life-threatening. What is a pain treatment plan? A pain treatment plan is an agreement between you and your health care provider. Pain is unique to each person, and treatments vary depending on your condition. To manage your pain, you and your health care provider need to work together. To help you do this: Discuss the goals of your treatment, including how much pain you might expect to have and how you will manage the pain. Review the risks and benefits of taking opioid medicines. Remember that a good treatment plan uses more than one approach and minimizes the chance of side effects. Be honest about the amount of medicines you take and about any drug or alcohol use. Get pain medicine prescriptions from only one health care provider. Pain can be managed with many types of alternative treatments. Ask your health care provider to refer you to one or more specialists who can help you manage pain through: Physical or occupational therapy. Counseling (cognitive behavioral therapy). Good nutrition. Biofeedback. Massage. Meditation. Non-opioid medicine. Following a gentle exercise program. How to use opioid pain medicine Taking medicine Take your pain medicine exactly as told by your health care provider. Take it only when you need it. If your  pain gets less severe, you may take less than your prescribed dose if your health care provider approves. If you are not having pain, do nottake pain medicine unless your health care provider tells you to take it. If your pain is severe, do nottry to treat it yourself by taking more pills than instructed on your prescription. Contact your health care provider for help. Write down the times when you take your pain medicine. It is  easy to become confused while on pain medicine. Writing the time can help you avoid overdose. Take other over-the-counter or prescription medicines only as told by your health care provider. Keeping yourself and others safe  While you are taking opioid pain medicine: Do not drive, use machinery, or power tools. Do not sign legal documents. Do not drink alcohol. Do not take sleeping pills. Do not supervise children by yourself. Do not do activities that require climbing or being in high places. Do not go to a lake, river, ocean, spa, or swimming pool. Do not share your pain medicine with anyone. Keep pain medicine in a locked cabinet or in a secure area where pets and children cannot reach it. Stopping your use of opioids If you have been taking opioid medicine for more than a few weeks, you may need to slowly decrease (taper) how much you take until you stop completely. Tapering your use of opioids can decrease your risk of symptoms of withdrawal, such as: Pain and cramping in the abdomen. Nausea. Sweating. Sleepiness. Restlessness. Uncontrollable shaking (tremors). Cravings for the medicine. Do not attempt to taper your use of opioids on your own. Talk with your health care provider about how to do this. Your health care provider may prescribe a step-down schedule based on how much medicine you are taking and how long you have been taking it. Getting rid of leftover pills Do not save any leftover pills. Get rid of leftover pills safely by: Taking the medicine to a prescription take-back program. This is usually offered by the county or law enforcement. Bringing them to a pharmacy that has a drug disposal container. Flushing them down the toilet. Check the label or package insert of your medicine to see whether this is safe to do. Throwing them out in the trash. Check the label or package insert of your medicine to see whether this is safe to do. If it is safe to throw it out, remove  the medicine from the original container, put it into a sealable bag or container, and mix it with used coffee grounds, food scraps, dirt, or cat litter before putting it in the trash. Follow these instructions at home: Activity Do exercises as told by your health care provider. Avoid activities that make your pain worse. Return to your normal activities as told by your health care provider. Ask your health care provider what activities are safe for you. General instructions You may need to take these actions to prevent or treat constipation: Drink enough fluid to keep your urine pale yellow. Take over-the-counter or prescription medicines. Eat foods that are high in fiber, such as beans, whole grains, and fresh fruits and vegetables. Limit foods that are high in fat and processed sugars, such as fried or sweet foods. Keep all follow-up visits. This is important. Where to find support If you have been taking opioids for a long time, you may benefit from receiving support for quitting from a local support group or counselor. Ask your health care provider for a referral to these  resources in your area. Where to find more information Centers for Disease Control and Prevention (CDC): http://www.wolf.info/ [place] Food and Drug Administration (FDA): [place] Get help right away if: You may have taken too much of an opioid (overdosed). Common symptoms of an overdose: Your breathing is slower or more shallow than normal. You have a very slow heartbeat (pulse). You have slurred speech. You have nausea and vomiting. Your pupils become very small. You have other potential symptoms: You are very confused. You faint or feel like you will faint. You have cold, clammy skin. You have blue lips or fingernails. You have thoughts of harming yourself or harming others. These symptoms may represent a serious problem that is an emergency. Do not wait to see if the symptoms will go away. Get medical help right away.  Call your local emergency services (911 in the [place] Do not drive yourself to the hospital.  If you ever feel like you may hurt yourself or others, or have thoughts about taking your own life, get help right away. Go to your nearest emergency department or: Call your local emergency services (911 in the [place] Call the [facility] 907-840-4989 in the [place] Call a suicide crisis helpline, such as the [facility] at 320-178-3411 or 988 in the [place]. This is open 24 hours a day in the [place] Text the Crisis Text Line at (815)485-2728 (in the [place].). Summary Opioid medicines can help you manage moderate to severe pain for a short period of time. A pain treatment plan is an agreement between you and your health care provider. Discuss the goals of your treatment, including how much pain you might expect to have and how you will manage the pain. If you think that you or someone else may have taken too much of an opioid, get medical help right away. This information is not intended to replace advice given to you by your health care provider. Make sure you discuss any questions you have with your health care provider. Document Revised: 05/11/2021 Document Reviewed: 01/26/2021 Elsevier Patient Education  [place].  Advanced directives: Yes  Conditions/risks identified: None  Next appointment: Follow up in one year for your annual wellness visit    Preventive Care 65 Years and Older, Female Preventive care refers to lifestyle choices and visits with your health care provider that can promote health and wellness. What does preventive care include? A yearly physical exam. This is also called an annual well check. Dental exams once or twice a year. Routine eye exams. Ask your health care provider how often you should have your eyes checked. Personal lifestyle choices, including: Daily care of your teeth and gums. Regular physical activity. Eating a  healthy diet. Avoiding tobacco and drug use. Limiting alcohol use. Practicing safe sex. Taking low-dose aspirin every day. Taking vitamin and mineral supplements as recommended by your health care provider. What happens during an annual well check? The services and screenings done by your health care provider during your annual well check will depend on your age, overall health, lifestyle risk factors, and family history of disease. Counseling  Your health care provider may ask you questions about your: Alcohol use. Tobacco use. Drug use. Emotional well-being. Home and relationship well-being. Sexual activity. Eating habits. History of falls. Memory and ability to understand (cognition). Work and work Statistician. Reproductive health. Screening  You may have the following tests or measurements: Height, weight, and BMI. Blood pressure. Lipid and cholesterol levels. These may be  checked every 5 years, or more frequently if you are over 57 years old. Skin check. Lung cancer screening. You may have this screening every year starting at age 4 if you have a 30-pack-year history of smoking and currently smoke or have quit within the past 15 years. Fecal occult blood test (FOBT) of the stool. You may have this test every year starting at age 33. Flexible sigmoidoscopy or colonoscopy. You may have a sigmoidoscopy every 5 years or a colonoscopy every 10 years starting at age 51. Hepatitis C blood test. Hepatitis B blood test. Sexually transmitted disease (STD) testing. Diabetes screening. This is done by checking your blood sugar (glucose) after you have not eaten for a while (fasting). You may have this done every 1-3 years. Bone density scan. This is done to screen for osteoporosis. You may have this done starting at age 70. Mammogram. This may be done every 1-2 years. Talk to your health care provider about how often you should have regular mammograms. Talk with your health care  provider about your test results, treatment options, and if necessary, the need for more tests. Vaccines  Your health care provider may recommend certain vaccines, such as: Influenza vaccine. This is recommended every year. Tetanus, diphtheria, and acellular pertussis (Tdap, Td) vaccine. You may need a Td booster every 10 years. Zoster vaccine. You may need this after age 3. Pneumococcal 13-valent conjugate (PCV13) vaccine. One dose is recommended after age 16. Pneumococcal polysaccharide (PPSV23) vaccine. One dose is recommended after age 11. Talk to your health care provider about which screenings and vaccines you need and how often you need them. This information is not intended to replace advice given to you by your health care provider. Make sure you discuss any questions you have with your health care provider. Document Released: 11/12/2015 Document Revised: 07/05/2016 Document Reviewed: 08/17/2015 Elsevier Interactive Patient Education  2017 [place] Prevention in the Home Falls can cause injuries. They can happen to people of all ages. There are many things you can do to make your home safe and to help prevent falls. What can I do on the outside of my home? Regularly fix the edges of walkways and driveways and fix any cracks. Remove anything that might make you trip as you walk through a door, such as a raised step or threshold. Trim any bushes or trees on the path to your home. Use bright outdoor lighting. Clear any walking paths of anything that might make someone trip, such as rocks or tools. Regularly check to see if handrails are loose or broken. Make sure that both sides of any steps have handrails. Any raised decks and porches should have guardrails on the edges. Have any leaves, snow, or ice cleared regularly. Use sand or salt on walking paths during winter. Clean up any spills in your garage right away. This includes oil or grease spills. What can I do in the  bathroom? Use night lights. Install grab bars by the toilet and in the tub and shower. Do not use towel bars as grab bars. Use non-skid mats or decals in the tub or shower. If you need to sit down in the shower, use a plastic, non-slip stool. Keep the floor dry. Clean up any water that spills on the floor as soon as it happens. Remove soap buildup in the tub or shower regularly. Attach bath mats securely with double-sided non-slip rug tape. Do not have throw rugs and other things on the floor that  can make you trip. What can I do in the bedroom? Use night lights. Make sure that you have a light by your bed that is easy to reach. Do not use any sheets or blankets that are too big for your bed. They should not hang down onto the floor. Have a firm chair that has side arms. You can use this for support while you get dressed. Do not have throw rugs and other things on the floor that can make you trip. What can I do in the kitchen? Clean up any spills right away. Avoid walking on wet floors. Keep items that you use a lot in easy-to-reach places. If you need to reach something above you, use a strong step stool that has a grab bar. Keep electrical cords out of the way. Do not use floor polish or wax that makes floors slippery. If you must use wax, use non-skid floor wax. Do not have throw rugs and other things on the floor that can make you trip. What can I do with my stairs? Do not leave any items on the stairs. Make sure that there are handrails on both sides of the stairs and use them. Fix handrails that are broken or loose. Make sure that handrails are as long as the stairways. Check any carpeting to make sure that it is firmly attached to the stairs. Fix any carpet that is loose or worn. Avoid having throw rugs at the top or bottom of the stairs. If you do have throw rugs, attach them to the floor with carpet tape. Make sure that you have a light switch at the top of the stairs and the  bottom of the stairs. If you do not have them, ask someone to add them for you. What else can I do to help prevent falls? Wear shoes that: Do not have high heels. Have rubber bottoms. Are comfortable and fit you well. Are closed at the toe. Do not wear sandals. If you use a stepladder: Make sure that it is fully opened. Do not climb a closed stepladder. Make sure that both sides of the stepladder are locked into place. Ask someone to hold it for you, if possible. Clearly mark and make sure that you can see: Any grab bars or handrails. First and last steps. Where the edge of each step is. Use tools that help you move around (mobility aids) if they are needed. These include: Canes. Walkers. Scooters. Crutches. Turn on the lights when you go into a dark area. Replace any light bulbs as soon as they burn out. Set up your furniture so you have a clear path. Avoid moving your furniture around. If any of your floors are uneven, fix them. If there are any pets around you, be aware of where they are. Review your medicines with your doctor. Some medicines can make you feel dizzy. This can increase your chance of falling. Ask your doctor what other things that you can do to help prevent falls. This information is not intended to replace advice given to you by your health care provider. Make sure you discuss any questions you have with your health care provider. Document Released: 08/12/2009 Document Revised: 03/23/2016 Document Reviewed: 11/20/2014 Elsevier Interactive Patient Education  2017 [employer].

## 2021-12-22 ENCOUNTER — Other Ambulatory Visit: Payer: Self-pay | Admitting: Gastroenterology

## 2021-12-22 ENCOUNTER — Other Ambulatory Visit (HOSPITAL_BASED_OUTPATIENT_CLINIC_OR_DEPARTMENT_OTHER): Payer: Self-pay

## 2021-12-26 ENCOUNTER — Telehealth: Payer: Self-pay | Admitting: Gastroenterology

## 2021-12-26 MED ORDER — DICYCLOMINE HCL 10 MG PO CAPS: ORAL_CAPSULE | ORAL | 0 refills | Status: DC

## 2021-12-26 NOTE — Telephone Encounter (Signed)
Inbound call from patient requesting a medication refill for Dicyclomine sent to [facility] on [address]. Patient have OV scheduled 3/21

## 2021-12-26 NOTE — Telephone Encounter (Signed)
Prescription sent to patient's pharmacy until scheduled appt. 

## 2022-01-13 ENCOUNTER — Telehealth: Payer: Self-pay | Admitting: Family Medicine

## 2022-01-13 NOTE — Telephone Encounter (Signed)
No note needed 

## 2022-01-17 ENCOUNTER — Ambulatory Visit (INDEPENDENT_AMBULATORY_CARE_PROVIDER_SITE_OTHER): Payer: Medicare Other | Admitting: Gastroenterology

## 2022-01-17 ENCOUNTER — Encounter: Payer: Self-pay | Admitting: Gastroenterology

## 2022-01-17 VITALS — BP 118/70 | HR 71 | Ht <= 58 in | Wt 129.8 lb

## 2022-01-17 DIAGNOSIS — K582 Mixed irritable bowel syndrome: Secondary | ICD-10-CM

## 2022-01-17 DIAGNOSIS — R159 Full incontinence of feces: Secondary | ICD-10-CM | POA: Diagnosis not present

## 2022-01-17 MED ORDER — DICYCLOMINE HCL 10 MG PO CAPS: ORAL_CAPSULE | ORAL | 11 refills | Status: DC

## 2022-01-17 NOTE — Progress Notes (Signed)
? ? ?  History of Present Illness: This is an 81 year old female with IBS and an alternating pattern.  Generally she has soft pasty stools with occasional constipation. She has occasional fecal incontinence, averaging about once every 2 weeks.  Colonoscopy was performed in 2019 showing severe left colon diverticulosis and internal hemorrhoids.  ? ?Current Medications, Allergies, Past Medical History, Past Surgical History, Family History and Social History were reviewed in [employer] record. ? ? ?Physical Exam: ?General: Well developed, well nourished, elderly, no acute distress ?Head: Normocephalic and atraumatic ?Eyes: Sclerae anicteric, EOMI ?Ears: Normal auditory acuity ?Mouth: Not examined, mask on during Covid-19 pandemic ?Lungs: Clear throughout to auscultation ?Heart: Regular rate and rhythm; no murmurs, rubs or bruits ?Abdomen: Soft, non tender and non distended. No masses, hepatosplenomegaly or hernias noted. Normal Bowel sounds ?Rectal: Not done ?Musculoskeletal: Symmetrical with no gross deformities  ?Pulses:  Normal pulses noted ?Extremities: No clubbing, cyanosis, edema or deformities noted ?Neurological: Alert oriented x 4, grossly nonfocal ?Psychological:  Alert and cooperative. Normal mood and affect ? ? ?Assessment and Recommendations: ? ?IBS with an alternating pattern.  Severe left colon diverticulosis.  Continue with adequate daily fiber and water intake.  Continue dicyclomine 10 mg before meals and at bedtime.  Imodium 1 mg to 2 mg po tid prn. Advised Kegel exercises at least 10 times daily long-term. REV in 1 year.  ?

## 2022-01-17 NOTE — Patient Instructions (Addendum)
We have sent the following medications to your pharmacy for you to pick up at your convenience: dicyclomine. ? ?You can over the counter Imodium twice daily as needed for diarrhea symptoms.  ? ?Please do Kegel exercises at least 10 times daily.  ? ?The [facility] providers would like to encourage you to use [facility] to communicate with providers for non-urgent requests or questions.  Due to long hold times on the telephone, sending your provider a message by [facility] may be a faster and more efficient way to get a response.  Please allow 48 business hours for a response.  Please remember that this is for non-urgent requests.  ? ?Thank you for choosing me and [facility]. ? ?Malcolm T. Dagoberto Ligas., MD., [facility] ? ?Kegel Exercises ?Kegel exercises can help strengthen your pelvic floor muscles. The pelvic floor is a group of muscles that support your rectum, small intestine, and bladder. In females, pelvic floor muscles also help support the uterus. These muscles help you control the flow of urine and stool (feces). ?Kegel exercises are painless and simple. They do not require any equipment. Your provider may suggest Kegel exercises to: ?Improve bladder and bowel control. ?Improve sexual response. ?Improve weak pelvic floor muscles after surgery to remove the uterus (hysterectomy) or after pregnancy, in females. ?Improve weak pelvic floor muscles after prostate gland removal or surgery, in males. ?Kegel exercises involve squeezing your pelvic floor muscles. These are the same muscles you squeeze when you try to stop the flow of urine or keep from passing gas. The exercises can be done while sitting, standing, or lying down, but it is best to vary your position. ?Ask your health care provider which exercises are safe for you. Do exercises exactly as told by your health care provider and adjust them as directed. Do not begin these exercises until told by your health care provider. ?Exercises ?How to do Kegel  exercises: ?Squeeze your pelvic floor muscles tight. You should feel a tight lift in your rectal area. If you are a female, you should also feel a tightness in your vaginal area. Keep your stomach, buttocks, and legs relaxed. ?Hold the muscles tight for up to 10 seconds. ?Breathe normally. ?Relax your muscles for up to 10 seconds. ?Repeat as told by your health care provider. ?Repeat this exercise daily as told by your health care provider. Continue to do this exercise for at least 4-6 weeks, or for as long as told by your health care provider. ?You may be referred to a physical therapist who can help you learn more about how to do Kegel exercises. ?Depending on your condition, your health care provider may recommend: ?Varying how long you squeeze your muscles. ?Doing several sets of exercises every day. ?Doing exercises for several weeks. ?Making Kegel exercises a part of your regular exercise routine. ?This information is not intended to replace advice given to you by your health care provider. Make sure you discuss any questions you have with your health care provider. ?Document Revised: 02/24/2021 Document Reviewed: 02/24/2021 ?Elsevier Patient Education ? [place]. ? ?

## 2022-01-25 ENCOUNTER — Ambulatory Visit: Payer: Medicare Other | Admitting: Family Medicine

## 2022-01-26 ENCOUNTER — Encounter: Payer: Self-pay | Admitting: Nurse Practitioner

## 2022-01-26 ENCOUNTER — Ambulatory Visit (INDEPENDENT_AMBULATORY_CARE_PROVIDER_SITE_OTHER): Payer: Medicare Other | Admitting: Nurse Practitioner

## 2022-01-26 VITALS — BP 110/78 | HR 71 | Temp 96.4°F | Ht <= 58 in | Wt 129.8 lb

## 2022-01-26 DIAGNOSIS — Z85828 Personal history of other malignant neoplasm of skin: Secondary | ICD-10-CM | POA: Diagnosis not present

## 2022-01-26 DIAGNOSIS — E785 Hyperlipidemia, unspecified: Secondary | ICD-10-CM

## 2022-01-26 DIAGNOSIS — I480 Paroxysmal atrial fibrillation: Secondary | ICD-10-CM

## 2022-01-26 DIAGNOSIS — I700 Atherosclerosis of aorta: Secondary | ICD-10-CM | POA: Diagnosis not present

## 2022-01-26 DIAGNOSIS — Z8719 Personal history of other diseases of the digestive system: Secondary | ICD-10-CM | POA: Diagnosis not present

## 2022-01-26 DIAGNOSIS — L719 Rosacea, unspecified: Secondary | ICD-10-CM

## 2022-01-26 DIAGNOSIS — G8929 Other chronic pain: Secondary | ICD-10-CM

## 2022-01-26 DIAGNOSIS — E1169 Type 2 diabetes mellitus with other specified complication: Secondary | ICD-10-CM

## 2022-01-26 DIAGNOSIS — E119 Type 2 diabetes mellitus without complications: Secondary | ICD-10-CM

## 2022-01-26 DIAGNOSIS — M545 Low back pain, unspecified: Secondary | ICD-10-CM

## 2022-01-26 DIAGNOSIS — Z789 Other specified health status: Secondary | ICD-10-CM

## 2022-01-26 NOTE — Assessment & Plan Note (Signed)
Chronic, stable.  She controls this with food, she did not tolerate statins in the past.  Her last LDL was 81 in 06/2021.  Will not start statin at this time.  Follow-up in 6 months ?

## 2022-01-26 NOTE — Assessment & Plan Note (Signed)
She has a history of basal cell skin cancer.  She follows with dermatology yearly.  Continue collaboration recommendations from dermatology. ?

## 2022-01-26 NOTE — Assessment & Plan Note (Addendum)
Chronic, stable.  Her last A1c was 6.8% in 06/2021.  She controls this with diet alone.  She is scheduled for an eye exam next month.  She does not tolerate statins in the past, it caused nausea and vomiting.  She is up-to-date on her foot exam.  Last CMP and CBC reviewed from 06/2021.  Will check labs next visit along with urine microalbumin.  Follow-up in 6 months. ?

## 2022-01-26 NOTE — Assessment & Plan Note (Signed)
She has a history of IBS.  She sees Dr. Fuller Plan with GI.  She is currently taking Bentyl 4 times a day and Imodium as needed.  Last note from Dr. Fuller Plan reviewed.  Continue collaboration recommendations from GI. ?

## 2022-01-26 NOTE — Assessment & Plan Note (Signed)
Chronic, stable.  She was seeing cardiology however they state that she can just follow-up with her PCP for now.  She is taking diltiazem 120 mg daily.  We will continue this regimen.  She is not on anticoagulation due to family members having bleeding issues with this in the past.  Continue aspirin 81 mg daily.  Follow-up in 6 months or sooner with concerns. ?

## 2022-01-26 NOTE — Assessment & Plan Note (Signed)
Noted on imaging in the past.  She has been watching her diet and controlling her cholesterol with diet alone.  ?

## 2022-01-26 NOTE — Assessment & Plan Note (Signed)
She has a history of rosacea, she uses metronidazole gel daily on her face.  We will continue this regimen.  Also follows with dermatology. ?

## 2022-01-26 NOTE — Progress Notes (Signed)
? ?Established Patient Office Visit ? ?Subjective:  ?Patient ID: Angel Holland, female    DOB: 15-Feb-1941  Age: 81 y.o. MRN: [id number] ? ?CC:  ?Chief Complaint  ?Patient presents with  ? Transitions Of Care  ?  Toc. Est care. No main concerns.   ? ? ?HPI ?Angel Holland presents to transfer care to a new provider.  Introduced to Designer, jewellery role and practice setting.  All questions answered.  Discussed provider/patient relationship and expectations. ? ?She lives at home alone. Her husband passed away 2 years ago. She is independent at home and still drives. She does not have a relationship with her 2 sons.   ? ?She has a history of chronic low back pain. She injured it while trying to fix her window 2 years ago. She states that the pain had radiated down her left leg, however for the last few months it has not radiated down her leg. She sees Dr. Nelva Bush with orthopedics. She only takes hydrocodone at night for the pain. She has an appointment with him on Monday.  ? ?She has a history of enchondroma of her left knee. She states that her left leg is not as mobile as her right. She states her knee hurts when she crosses her left leg over her right.  She follows with Dr. Nelva Bush for this as well. ? ?She has a hsitory of IBS with diarrhea with some fecal incontinence. She controls the diarreha with her diet, and Bentyl 4 times a day.  She takes Imodium as needed, most of the time before she leaves the house for doctor's appointment.  She follows with Dr. Fuller Plan. She has been doing kegel exercises which haven't seemed to help her incontinence.  ? ?She has a history of paroxysmal A-fib.  She is taking diltiazem 120 mg daily.  She was seeing Dr. Harl Bowie with cardiology, however he stated that she could just follow-up with her PCP and continue the diltiazem and aspirin 81 mg daily. ? ?She has a history of diabetes that is diet controlled.  She does not check her blood sugars at home.  She was prescribed a statin, however she  did not tolerate it and it caused nausea and vomiting.  She controls her cholesterol with her diet. ? ?Past Medical History:  ?Diagnosis Date  ? Anxiety   ? no meds  ? Arthritis   ? back and fingers   ? Bruxism   ? Cataract   ? baby cataracts per pt.  ? Colon polyps   ? DDD (degenerative disc disease), lumbar   ? DDD (degenerative disc disease), lumbosacral   ? Depression   ? no meds  ? Diverticulosis of colon (without mention of hemorrhage)   ? Fibroid uterus   ? Headache, paroxysmal hemicrania, episodic 03/24/2014  ? Hemorrhoids   ? History of D&C   ? x2  ? History of night sweats   ? History of rectal bleeding   ? 08/2017  ? Hx of diverticulitis of colon 03/30/2014  ? Hyperglycemia   ? Irritable bowel syndrome   ? PAF (paroxysmal atrial fibrillation) ([place]) 01/2018  ? pt declined [facility]  ? Pulmonary nodules - reports followed by pulmonology, reports following up with them in June 2015 03/26/2013  ? Repeat CT chest 04/20/14 > 1. Scattered pulmonary nodules measure 4 mm or less in size, are unchanged from 03/28/2013 and are therefore considered benign    ? Rosacea   ? Tobacco abuse   ? Unspecified  hemorrhoids without mention of complication   ? ? ?Past Surgical History:  ?Procedure Laterality Date  ? APPENDECTOMY  1957  ? bce    ? CATARACT EXTRACTION    ? Left June 2021-Right 05/2020  ? CATARACT EXTRACTION, BILATERAL    ? CHOLECYSTECTOMY  2009  ? DILATION AND CURETTAGE OF UTERUS    ? fibroid removed from uterus    ? HYSTEROSCOPY WITH D & C N/A 09/09/2015  ? Procedure: DILATATION AND CURETTAGE /HYSTEROSCOPY;  Surgeon: Vanessa Kick, MD;  Location: [facility];  Service: Gynecology;  Laterality: N/A;  ? ? ?Family History  ?Problem Relation Age of Onset  ? Heart disease Mother   ?     CHF  ? Diverticulosis Mother   ? Bone cancer Father   ?     found in his mouth first  ? Colon cancer Neg Hx   ? Stomach cancer Neg Hx   ? Esophageal cancer Neg Hx   ? Pancreatic cancer Neg Hx   ? Colon polyps Neg Hx   ? ? ?Social History   ? ?Socioeconomic History  ? Marital status: Widowed  ?  Spouse name: Not on file  ? Number of children: 2  ? Years of education: Not on file  ? Highest education level: Some college, no degree  ?Occupational History  ? 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 Smoking status: Former  ?  Packs/day: 1.00  ?  Years: 50.00  ?  Pack years: 50.00  ?  Types: Cigarettes  ?  Quit date: 07/23/2008  ?  Years since quitting: 13.5  ? Smokeless tobacco: Never  ?Vaping Use  ? Vaping Use: Never used  ?Substance and Sexual Activity  ? Alcohol use: No  ? Drug use: No  ? Sexual activity: Not Currently  ?Other Topics Concern  ? Not on file  ?Social History Narrative  ?   ?   ?   ? ?Social Determinants of Health  ? ?Financial Resource Strain: Low Risk   ? Difficulty of Paying Living Expenses: Not hard at all  ?Food Insecurity: No Food Insecurity  ? Worried About Charity fundraiser in the Last Year: Never true  ? Ran Out of Food in the Last Year: Never true  ?Transportation Needs: No Transportation Needs  ? Lack of Transportation (Medical): No  ? Lack of Transportation (Non-Medical): No  ?Physical Activity: Sufficiently Active  ? Days of Exercise per Week: 5 days  ? Minutes of Exercise per Session: 30 min  ?Stress: No Stress Concern Present  ? Feeling of Stress : Not at all  ?Social Connections: Moderately Integrated  ? Frequency of Communication with Friends and Family: Once a week  ? Frequency of Social Gatherings with Friends and Family: More than three times a week  ? Attends Religious Services: More than 4 times per year  ? Active Member of Clubs or Organizations: Yes  ? Attends Archivist Meetings: More than 4 times per year  ? Marital Status: Widowed  ?Intimate Partner Violence: Not At Risk  ? Fear of Current or Ex-Partner: No  ? Emotionally Abused: No  ? Physically Abused: No  ? Sexually Abused: No  ? ? ?Outpatient Medications Prior to Visit  ?Medication Sig Dispense Refill  ? AMBULATORY NON  FORMULARY MEDICATION Medication Name: Leverne Humbles once daily    ? Ascorbic Acid (VITAMIN C PO) Take 1,000 mg by mouth 2 (two) times daily with a meal.    ?  aspirin EC 81 MG tablet Take 81 mg by mouth See admin instructions. Take one tablet (81 mg) by mouth every morning, may also take one tablet (81 mg) at night as needed for pain/sleep    ? Cyanocobalamin (VITAMIN B-12 PO) Take 130 mg by mouth daily with supper.    ? dicyclomine (BENTYL) 10 MG capsule TAKE 1 CAPSULE BY MOUTH FOUR TIMES DAILY BEFORE MEALS AND AT BEDTIME 120 capsule 11  ? diltiazem (CARDIZEM CD) 120 MG 24 hr capsule TAKE 1 CAPSULE(120 MG) BY MOUTH DAILY 90 capsule 3  ? hydrocodone-ibuprofen (VICOPROFEN) 5-200 MG tablet Take 1 tablet by mouth daily as needed for pain. 30 tablet 0  ? loperamide (IMODIUM) 2 MG capsule Take 2 mg by mouth daily as needed for diarrhea or loose stools.     ? metroNIDAZOLE (METROGEL) 0.75 % gel Apply 1 application topically every morning. Applies to face for rosacea    ? Multiple Vitamins-Minerals (OCUVITE PO) Take 1 tablet by mouth at bedtime.    ? triamcinolone (NASACORT) 55 MCG/ACT AERO nasal inhaler Place 2 sprays into the nose daily as needed (congestion/sinus headache).     ? VITAMIN D PO Take 1,000 Units by mouth daily with breakfast.    ? Wheat Dextrin (BENEFIBER DRINK MIX PO) Take 15 mLs by mouth 2 (two) times daily.     ? 0.9 %  sodium chloride infusion     ? ?No facility-administered medications prior to visit.  ? ? ?Allergies  ?Allergen Reactions  ? Biaxin [Clarithromycin] Other (See Comments)  ?  BREASTS INFLAMED  ? Codeine Other (See Comments)  ?  PANIC ATTACKS, CAN'T SLEEP  ? Decongestant [Pseudoephedrine Hcl Er] Other (See Comments)  ?  Cannot take with Dilitiazem  ? Phenylephrine Other (See Comments)  ?  Cannot take with Dilitiazem  ? Rosuvastatin Nausea And Vomiting and Other (See Comments)  ? Acetaminophen Other (See Comments)  ?  Triggered her "heart problem". Referring to atrial fibrillation   ? ? ?ROS ?Review of Systems  ?Constitutional: Negative.   ?HENT: Negative.    ?Eyes: Negative.   ?Respiratory: Negative.    ?Cardiovascular: Negative.   ?Gastrointestinal:  Positive for diarrhea. Negative for abdominal pai

## 2022-01-26 NOTE — Patient Instructions (Signed)
It was great to see you! ? ?Call me with any concerns.  ? ?Let's follow-up in 6 months, sooner if you have concerns. ? ?If a referral was placed today, you will be contacted for an appointment. Please note that routine referrals can sometimes take up to 3-4 weeks to process. Please call our office if you haven't heard anything after this time frame. ? ?Take care, ? ?Vance Peper, NP ? ?

## 2022-01-26 NOTE — Assessment & Plan Note (Signed)
History of low back pain from an injury, without sciatica at this point.  She follows with Dr. Nelva Bush with orthopedics and has an appointment with him on Monday.  She takes hydrocodone-ibuprofen 5-200 mg at bedtime.  Continue recommendations and collaboration from orthopedics. ?

## 2022-01-26 NOTE — Assessment & Plan Note (Signed)
She has an intolerance to statins that include nausea and vomiting.  Her LDL is 81 from 06/2021.  This is controlled well with diet alone.  Continue dietary changes.  We will check labs next visit. ?

## 2022-01-30 DIAGNOSIS — I700 Atherosclerosis of aorta: Secondary | ICD-10-CM | POA: Diagnosis not present

## 2022-01-30 DIAGNOSIS — M5416 Radiculopathy, lumbar region: Secondary | ICD-10-CM | POA: Diagnosis not present

## 2022-02-27 LAB — HM DIABETES EYE EXAM

## 2022-04-24 DIAGNOSIS — M5459 Other low back pain: Secondary | ICD-10-CM | POA: Diagnosis not present

## 2022-04-24 DIAGNOSIS — M5416 Radiculopathy, lumbar region: Secondary | ICD-10-CM | POA: Diagnosis not present

## 2022-04-24 DIAGNOSIS — M48062 Spinal stenosis, lumbar region with neurogenic claudication: Secondary | ICD-10-CM | POA: Diagnosis not present

## 2022-04-28 ENCOUNTER — Encounter: Payer: Self-pay | Admitting: Nurse Practitioner

## 2022-04-28 ENCOUNTER — Ambulatory Visit (INDEPENDENT_AMBULATORY_CARE_PROVIDER_SITE_OTHER): Payer: Medicare Other | Admitting: Nurse Practitioner

## 2022-04-28 VITALS — BP 126/59 | HR 77 | Wt 129.4 lb

## 2022-04-28 DIAGNOSIS — R300 Dysuria: Secondary | ICD-10-CM

## 2022-04-28 DIAGNOSIS — N3000 Acute cystitis without hematuria: Secondary | ICD-10-CM | POA: Diagnosis not present

## 2022-04-28 LAB — POCT URINALYSIS DIPSTICK
Bilirubin, UA: NEGATIVE
Blood, UA: POSITIVE
Glucose, UA: NEGATIVE
Ketones, UA: NEGATIVE
Nitrite, UA: NEGATIVE
Protein, UA: POSITIVE — AB
Spec Grav, UA: 1.025 (ref 1.010–1.025)
Urobilinogen, UA: NEGATIVE E.U./dL — AB
pH, UA: 5.5 (ref 5.0–8.0)

## 2022-04-28 MED ORDER — CEPHALEXIN 500 MG PO CAPS: 500.0000 mg | ORAL_CAPSULE | Freq: Two times a day (BID) | ORAL | 0 refills | Status: DC

## 2022-04-28 MED ORDER — FLUCONAZOLE 150 MG PO TABS: ORAL_TABLET | ORAL | 0 refills | Status: DC

## 2022-04-28 NOTE — Progress Notes (Signed)
Acute Office Visit  Subjective:     Patient ID: Angel Holland, female    DOB: 12/22/1940, 81 y.o.   MRN: [id number]  Chief Complaint  Patient presents with   Dysuria    Pt c/o pain during urinating, no burning or frequency.     HPI Patient is in today for burning with urination for 2 days. She denies urinary frequency. She denies nausea, back pain, and fevers. She has been drinking water and cranberry juice.  She thinks she may have a UTI.  ROS See pertinent positives and negatives per HPI.     Objective:    BP (!) 126/59   Pulse 77   Wt 129 lb 6.4 oz (58.7 kg)   SpO2 98%   BMI 27.04 kg/m    Physical Exam Vitals and nursing note reviewed.  Constitutional:      General: She is not in acute distress.    Appearance: Normal appearance.  HENT:     Head: Normocephalic.  Eyes:     Conjunctiva/sclera: Conjunctivae normal.  Cardiovascular:     Rate and Rhythm: Normal rate and regular rhythm.     Pulses: Normal pulses.     Heart sounds: Normal heart sounds.  Pulmonary:     Effort: Pulmonary effort is normal.     Breath sounds: Normal breath sounds.  Abdominal:     Palpations: Abdomen is soft.     Tenderness: There is no abdominal tenderness. There is no right CVA tenderness or left CVA tenderness.  Musculoskeletal:     Cervical back: Normal range of motion.  Skin:    General: Skin is warm.  Neurological:     General: No focal deficit present.     Mental Status: She is alert and oriented to person, place, and time.  Psychiatric:        Mood and Affect: Mood normal.        Behavior: Behavior normal.        Thought Content: Thought content normal.        Judgment: Judgment normal.     Results for orders placed or performed in visit on 04/28/22  POCT Urinalysis Dipstick  Result Value Ref Range   Color, UA Amber    Clarity, UA Cloudy    Glucose, UA Negative Negative   Bilirubin, UA negative    Ketones, UA Negative    Spec Grav, UA 1.025 1.010 - 1.025    Blood, UA Positive (2+)    pH, UA 5.5 5.0 - 8.0   Protein, UA Positive (A) Negative   Urobilinogen, UA negative (A) 0.2 or 1.0 E.U./dL   Nitrite, UA Negative    Leukocytes, UA Moderate (2+) (A) Negative   Appearance     Odor          Assessment & Plan:   Problem List Items Addressed This Visit   None Visit Diagnoses     Acute cystitis without hematuria    -  Primary   We will treat with Keflex 500 mg twice daily x7 days.  Encourage fluids.  Follow-up if symptoms are not improving.  Did not have enough urine for culture   Difficult or painful urination       UA positive for leukocytes 2+, will treat for UTI.   Relevant Orders   POCT Urinalysis Dipstick (Completed)       Meds ordered this encounter  Medications   cephALEXin (KEFLEX) 500 MG capsule    Sig: Take 1 capsule (500  mg total) by mouth 2 (two) times daily.    Dispense:  14 capsule    Refill:  0   fluconazole (DIFLUCAN) 150 MG tablet    Sig: Take 1 tablet after finishing your antibiotic, you can take a second one in 3 days if needed    Dispense:  2 tablet    Refill:  0    Return if symptoms worsen or fail to improve.  Charyl Dancer, NP

## 2022-04-28 NOTE — Patient Instructions (Addendum)
It was great to see you!  Start keflex twice a day with food for 7 days. Then take diflucan 1 tablet to prevent yeast infection. If you need a second diflucan you can take it in 3 days after the first one. Make sure you are drinking plenty of water  Let's follow-up at your next visit, sooner if you have concerns.  If a referral was placed today, you will be contacted for an appointment. Please note that routine referrals can sometimes take up to 3-4 weeks to process. Please call our office if you haven't heard anything after this time frame.  Take care,  Vance Peper, NP

## 2022-05-29 DIAGNOSIS — Z1231 Encounter for screening mammogram for malignant neoplasm of breast: Secondary | ICD-10-CM | POA: Diagnosis not present

## 2022-05-29 LAB — HM MAMMOGRAPHY

## 2022-07-27 NOTE — Progress Notes (Signed)
Established Patient Office Visit  Subjective   Patient ID: Angel Holland, female    DOB: 02/02/1941  Age: 81 y.o. MRN: [id number]  Chief Complaint  Patient presents with   Follow-up    6 mo f/u DM    HPI  Angel Holland is here to follow-up on diabetes, hyperlipidemia, and paroxysmal a-fib.   She states that she is doing well at home.  She has been having some increase in anxiety and tearfulness as her friend was recently placed in hospice.  She states that she does not have a lot of friends or family who come and check on her and that this is hard for her since they are close.  She denies SI/HI.  HYPERTENSION  Hypertension status: controlled  Satisfied with current treatment? yes Duration of hypertension: chronic BP medication side effects:  no Medication compliance: excellent compliance Previous BP meds:diltiazem Aspirin: no Recurrent headaches: no Visual changes: no Palpitations: no Dyspnea: no Chest pain: no Lower extremity edema: no Dizzy/lightheaded: no    ROS See pertinent positives and negatives per HPI.    Objective:     BP 112/62   Pulse 71   Temp (!) 96.8 F (36 C) (Temporal)   Wt 125 lb (56.7 kg)   SpO2 94%   BMI 26.13 kg/m    Physical Exam Vitals and nursing note reviewed.  Constitutional:      General: She is not in acute distress.    Appearance: Normal appearance.  HENT:     Head: Normocephalic.  Eyes:     Conjunctiva/sclera: Conjunctivae normal.  Cardiovascular:     Rate and Rhythm: Normal rate and regular rhythm.     Pulses: Normal pulses.     Heart sounds: Normal heart sounds.  Pulmonary:     Effort: Pulmonary effort is normal.     Breath sounds: Normal breath sounds.  Musculoskeletal:     Cervical back: Normal range of motion.  Skin:    General: Skin is warm.  Neurological:     General: No focal deficit present.     Mental Status: She is alert and oriented to person, place, and time.  Psychiatric:        Mood and Affect:  Mood normal.        Behavior: Behavior normal.        Thought Content: Thought content normal.        Judgment: Judgment normal.      Assessment & Plan:   Problem List Items Addressed This Visit       Cardiovascular and Mediastinum   PAF (paroxysmal atrial fibrillation) (HCC)   Acute diastolic heart failure (HCC)     Endocrine   Diabetes mellitus type II, controlled ([place]) - Primary    Chronic, stable.  This is well controlled with diet.  We will check A1c, CMP, CBC today.  She was unable to provide a urine sample for the microalbumin.  Follow-up in 6 months.      Relevant Orders   CBC   Comprehensive metabolic panel   Hemoglobin A1c   Hyperlipidemia associated with type 2 diabetes mellitus (HCC)    Chronic, stable.  She controls this with diet.  She did not tolerate statins in the past.  We will check a lipid panel today.  Follow-up in 6 months.      Relevant Orders   Lipid panel     Other   Statin intolerance    Unable to tolerate statins, however lipid  panel well-controlled with diet.  Checking lipid panel today.      Anxiety    He has been having an increase in some anxiety and tearfulness recently with finding out that her friend is in hospice.  Discussed talking with other friends, family, reaching out to people in her church for support.  She declines medication and referral to therapy today.  She denies SI/HI.  Follow-up in 6 months or sooner with concerns.      Other Visit Diagnoses     Need for influenza vaccination       Flu vaccine given today   Relevant Orders   Flu Vaccine QUAD High Dose(Fluad) (Completed)       Return in about 6 months (around 01/26/2023) for Diabetes, HTN.    Charyl Dancer, NP

## 2022-07-28 ENCOUNTER — Encounter: Payer: Self-pay | Admitting: Nurse Practitioner

## 2022-07-28 ENCOUNTER — Ambulatory Visit (INDEPENDENT_AMBULATORY_CARE_PROVIDER_SITE_OTHER): Payer: Medicare Other | Admitting: Nurse Practitioner

## 2022-07-28 VITALS — BP 112/62 | HR 71 | Temp 96.8°F | Wt 125.0 lb

## 2022-07-28 DIAGNOSIS — Z23 Encounter for immunization: Secondary | ICD-10-CM | POA: Diagnosis not present

## 2022-07-28 DIAGNOSIS — I480 Paroxysmal atrial fibrillation: Secondary | ICD-10-CM

## 2022-07-28 DIAGNOSIS — I5031 Acute diastolic (congestive) heart failure: Secondary | ICD-10-CM | POA: Diagnosis not present

## 2022-07-28 DIAGNOSIS — E785 Hyperlipidemia, unspecified: Secondary | ICD-10-CM

## 2022-07-28 DIAGNOSIS — E1169 Type 2 diabetes mellitus with other specified complication: Secondary | ICD-10-CM | POA: Diagnosis not present

## 2022-07-28 DIAGNOSIS — E119 Type 2 diabetes mellitus without complications: Secondary | ICD-10-CM

## 2022-07-28 DIAGNOSIS — F419 Anxiety disorder, unspecified: Secondary | ICD-10-CM

## 2022-07-28 DIAGNOSIS — Z789 Other specified health status: Secondary | ICD-10-CM

## 2022-07-28 LAB — CBC
HCT: 40.8 % (ref 36.0–46.0)
Hemoglobin: 13.3 g/dL (ref 12.0–15.0)
MCHC: 32.6 g/dL (ref 30.0–36.0)
MCV: 87.8 fl (ref 78.0–100.0)
Platelets: 291 10*3/uL (ref 150.0–400.0)
RBC: 4.65 Mil/uL (ref 3.87–5.11)
RDW: 14.8 % (ref 11.5–15.5)
WBC: 7.7 10*3/uL (ref 4.0–10.5)

## 2022-07-28 LAB — LIPID PANEL
Cholesterol: 144 mg/dL (ref 0–200)
HDL: 43.8 mg/dL (ref >39.00)
LDL Cholesterol: 75 mg/dL (ref 0–99)
NonHDL: 100.55
Total CHOL/HDL Ratio: 3
Triglycerides: 127 mg/dL (ref 0.0–149.0)
VLDL: 25.4 mg/dL (ref 0.0–40.0)

## 2022-07-28 LAB — COMPREHENSIVE METABOLIC PANEL
ALT: 16 U/L (ref 0–35)
AST: 13 U/L (ref 0–37)
Albumin: 4.2 g/dL (ref 3.5–5.2)
Alkaline Phosphatase: 66 U/L (ref 39–117)
BUN: 17 mg/dL (ref 6–23)
CO2: 28 mEq/L (ref 19–32)
Calcium: 9.7 mg/dL (ref 8.4–10.5)
Chloride: 103 mEq/L (ref 96–112)
Creatinine, Ser: 0.84 mg/dL (ref 0.40–1.20)
GFR: 65 mL/min (ref >60.00)
Glucose, Bld: 100 mg/dL — ABNORMAL HIGH (ref 70–99)
Potassium: 4.1 mEq/L (ref 3.5–5.1)
Sodium: 140 mEq/L (ref 135–145)
Total Bilirubin: 0.3 mg/dL (ref 0.2–1.2)
Total Protein: 7.2 g/dL (ref 6.0–8.3)

## 2022-07-28 LAB — HEMOGLOBIN A1C: Hgb A1c MFr Bld: 6.6 % — ABNORMAL HIGH (ref 4.6–6.5)

## 2022-07-28 NOTE — Patient Instructions (Signed)
It was great to see you!  We are checking your labs today and will let you know the results via mychart/phone.   Let's follow-up in 6 months, sooner if you have concerns.  If a referral was placed today, you will be contacted for an appointment. Please note that routine referrals can sometimes take up to 3-4 weeks to process. Please call our office if you haven't heard anything after this time frame.  Take care,  Zaahir Pickney, NP  

## 2022-07-28 NOTE — Assessment & Plan Note (Signed)
Unable to tolerate statins, however lipid panel well-controlled with diet.  Checking lipid panel today.

## 2022-07-28 NOTE — Assessment & Plan Note (Signed)
He has been having an increase in some anxiety and tearfulness recently with finding out that her friend is in hospice.  Discussed talking with other friends, family, reaching out to people in her church for support.  She declines medication and referral to therapy today.  She denies SI/HI.  Follow-up in 6 months or sooner with concerns.

## 2022-07-28 NOTE — Assessment & Plan Note (Signed)
Chronic, stable.  This is well controlled with diet.  We will check A1c, CMP, CBC today.  She was unable to provide a urine sample for the microalbumin.  Follow-up in 6 months.

## 2022-07-28 NOTE — Assessment & Plan Note (Signed)
Chronic, stable.  She controls this with diet.  She did not tolerate statins in the past.  We will check a lipid panel today.  Follow-up in 6 months.

## 2022-07-28 NOTE — Assessment & Plan Note (Deleted)
Chronic, stable.  This is well controlled with diet.  We will check A1c, CMP, CBC today.  She was unable to provide a urine sample for the microalbumin.  Follow-up in 6 months.

## 2022-08-02 NOTE — Progress Notes (Signed)
Called and informed patient of results and provider instructions. Patient voiced understanding. Sw, cma

## 2022-08-19 ENCOUNTER — Other Ambulatory Visit: Payer: Self-pay | Admitting: Cardiovascular Disease

## 2022-08-21 ENCOUNTER — Telehealth: Payer: Self-pay | Admitting: Nurse Practitioner

## 2022-08-21 ENCOUNTER — Other Ambulatory Visit: Payer: Self-pay | Admitting: Nurse Practitioner

## 2022-08-21 DIAGNOSIS — Z5181 Encounter for therapeutic drug level monitoring: Secondary | ICD-10-CM | POA: Diagnosis not present

## 2022-08-21 DIAGNOSIS — M5459 Other low back pain: Secondary | ICD-10-CM | POA: Diagnosis not present

## 2022-08-21 DIAGNOSIS — M5416 Radiculopathy, lumbar region: Secondary | ICD-10-CM | POA: Diagnosis not present

## 2022-08-21 NOTE — Telephone Encounter (Signed)
Caller Name: Kitrina Maurin Call back phone #: [phone]  Reason for Call: Was prescribed Diltiazem from her cardiologist. Back in July he told her she needed to have her PCP prescribed her any additional refills. She was last seen in August, does she need a f/u to get a prescription sent in?

## 2022-08-22 MED ORDER — DILTIAZEM HCL ER COATED BEADS 120 MG PO CP24: 120.0000 mg | ORAL_CAPSULE | Freq: Every day | ORAL | 3 refills | Status: DC

## 2022-08-22 NOTE — Telephone Encounter (Signed)
Duplicate request

## 2022-09-29 DIAGNOSIS — Z8616 Personal history of COVID-19: Secondary | ICD-10-CM

## 2022-09-29 HISTORY — DX: Personal history of COVID-19: Z86.16

## 2022-10-05 DIAGNOSIS — C44329 Squamous cell carcinoma of skin of other parts of face: Secondary | ICD-10-CM | POA: Diagnosis not present

## 2022-10-05 DIAGNOSIS — L821 Other seborrheic keratosis: Secondary | ICD-10-CM | POA: Diagnosis not present

## 2022-10-05 DIAGNOSIS — L570 Actinic keratosis: Secondary | ICD-10-CM | POA: Diagnosis not present

## 2022-10-05 DIAGNOSIS — Z85828 Personal history of other malignant neoplasm of skin: Secondary | ICD-10-CM | POA: Diagnosis not present

## 2022-10-05 DIAGNOSIS — D1801 Hemangioma of skin and subcutaneous tissue: Secondary | ICD-10-CM | POA: Diagnosis not present

## 2022-10-05 DIAGNOSIS — D485 Neoplasm of uncertain behavior of skin: Secondary | ICD-10-CM | POA: Diagnosis not present

## 2022-10-24 ENCOUNTER — Inpatient Hospital Stay (HOSPITAL_COMMUNITY)
Admission: EM | Admit: 2022-10-24 | Discharge: 2022-11-02 | DRG: 391 | Disposition: A | Discharge status: Home Health | Payer: Medicare Other | Attending: Internal Medicine | Admitting: Internal Medicine

## 2022-10-24 ENCOUNTER — Encounter (HOSPITAL_COMMUNITY): Payer: Self-pay | Admitting: Emergency Medicine

## 2022-10-24 ENCOUNTER — Emergency Department (HOSPITAL_COMMUNITY): Payer: Medicare Other

## 2022-10-24 ENCOUNTER — Telehealth: Payer: Self-pay | Admitting: Gastroenterology

## 2022-10-24 DIAGNOSIS — Z808 Family history of malignant neoplasm of other organs or systems: Secondary | ICD-10-CM

## 2022-10-24 DIAGNOSIS — E785 Hyperlipidemia, unspecified: Secondary | ICD-10-CM | POA: Diagnosis present

## 2022-10-24 DIAGNOSIS — F410 Panic disorder [episodic paroxysmal anxiety] without agoraphobia: Secondary | ICD-10-CM | POA: Diagnosis present

## 2022-10-24 DIAGNOSIS — K623 Rectal prolapse: Secondary | ICD-10-CM | POA: Diagnosis present

## 2022-10-24 DIAGNOSIS — Z7982 Long term (current) use of aspirin: Secondary | ICD-10-CM

## 2022-10-24 DIAGNOSIS — Z86010 Personal history of colonic polyps: Secondary | ICD-10-CM | POA: Diagnosis not present

## 2022-10-24 DIAGNOSIS — K5732 Diverticulitis of large intestine without perforation or abscess without bleeding: Principal | ICD-10-CM | POA: Diagnosis present

## 2022-10-24 DIAGNOSIS — E669 Obesity, unspecified: Secondary | ICD-10-CM | POA: Diagnosis present

## 2022-10-24 DIAGNOSIS — R194 Change in bowel habit: Secondary | ICD-10-CM | POA: Diagnosis not present

## 2022-10-24 DIAGNOSIS — R197 Diarrhea, unspecified: Secondary | ICD-10-CM | POA: Diagnosis not present

## 2022-10-24 DIAGNOSIS — I480 Paroxysmal atrial fibrillation: Secondary | ICD-10-CM | POA: Diagnosis present

## 2022-10-24 DIAGNOSIS — E119 Type 2 diabetes mellitus without complications: Secondary | ICD-10-CM | POA: Diagnosis present

## 2022-10-24 DIAGNOSIS — K760 Fatty (change of) liver, not elsewhere classified: Secondary | ICD-10-CM | POA: Diagnosis present

## 2022-10-24 DIAGNOSIS — R21 Rash and other nonspecific skin eruption: Secondary | ICD-10-CM | POA: Diagnosis not present

## 2022-10-24 DIAGNOSIS — Z634 Disappearance and death of family member: Secondary | ICD-10-CM

## 2022-10-24 DIAGNOSIS — K580 Irritable bowel syndrome with diarrhea: Secondary | ICD-10-CM | POA: Diagnosis present

## 2022-10-24 DIAGNOSIS — K648 Other hemorrhoids: Secondary | ICD-10-CM | POA: Diagnosis present

## 2022-10-24 DIAGNOSIS — L259 Unspecified contact dermatitis, unspecified cause: Secondary | ICD-10-CM | POA: Diagnosis present

## 2022-10-24 DIAGNOSIS — K5641 Fecal impaction: Secondary | ICD-10-CM | POA: Diagnosis present

## 2022-10-24 DIAGNOSIS — Z8249 Family history of ischemic heart disease and other diseases of the circulatory system: Secondary | ICD-10-CM | POA: Diagnosis not present

## 2022-10-24 DIAGNOSIS — K6289 Other specified diseases of anus and rectum: Secondary | ICD-10-CM | POA: Diagnosis not present

## 2022-10-24 DIAGNOSIS — M1990 Unspecified osteoarthritis, unspecified site: Secondary | ICD-10-CM | POA: Diagnosis present

## 2022-10-24 DIAGNOSIS — Z885 Allergy status to narcotic agent status: Secondary | ICD-10-CM

## 2022-10-24 DIAGNOSIS — R1310 Dysphagia, unspecified: Secondary | ICD-10-CM | POA: Diagnosis present

## 2022-10-24 DIAGNOSIS — R339 Retention of urine, unspecified: Secondary | ICD-10-CM

## 2022-10-24 DIAGNOSIS — R109 Unspecified abdominal pain: Secondary | ICD-10-CM | POA: Diagnosis not present

## 2022-10-24 DIAGNOSIS — K644 Residual hemorrhoidal skin tags: Secondary | ICD-10-CM | POA: Diagnosis present

## 2022-10-24 DIAGNOSIS — K5792 Diverticulitis of intestine, part unspecified, without perforation or abscess without bleeding: Secondary | ICD-10-CM | POA: Diagnosis not present

## 2022-10-24 DIAGNOSIS — K5900 Constipation, unspecified: Secondary | ICD-10-CM | POA: Insufficient documentation

## 2022-10-24 DIAGNOSIS — Z87891 Personal history of nicotine dependence: Secondary | ICD-10-CM

## 2022-10-24 DIAGNOSIS — J069 Acute upper respiratory infection, unspecified: Secondary | ICD-10-CM | POA: Diagnosis not present

## 2022-10-24 DIAGNOSIS — I482 Chronic atrial fibrillation, unspecified: Secondary | ICD-10-CM | POA: Diagnosis not present

## 2022-10-24 DIAGNOSIS — E876 Hypokalemia: Secondary | ICD-10-CM | POA: Diagnosis not present

## 2022-10-24 DIAGNOSIS — L719 Rosacea, unspecified: Secondary | ICD-10-CM | POA: Diagnosis present

## 2022-10-24 DIAGNOSIS — I700 Atherosclerosis of aorta: Secondary | ICD-10-CM | POA: Diagnosis not present

## 2022-10-24 DIAGNOSIS — U071 COVID-19: Secondary | ICD-10-CM | POA: Diagnosis not present

## 2022-10-24 DIAGNOSIS — Z888 Allergy status to other drugs, medicaments and biological substances status: Secondary | ICD-10-CM

## 2022-10-24 DIAGNOSIS — Z79899 Other long term (current) drug therapy: Secondary | ICD-10-CM

## 2022-10-24 LAB — BASIC METABOLIC PANEL
Anion gap: 10 (ref 5–15)
BUN: 14 mg/dL (ref 8–23)
CO2: 24 mmol/L (ref 22–32)
Calcium: 9.3 mg/dL (ref 8.9–10.3)
Chloride: 106 mmol/L (ref 98–111)
Creatinine, Ser: 0.78 mg/dL (ref 0.44–1.00)
GFR, Estimated: >60 mL/min (ref >60)
Glucose, Bld: 117 mg/dL — ABNORMAL HIGH (ref 70–99)
Potassium: 4 mmol/L (ref 3.5–5.1)
Sodium: 140 mmol/L (ref 135–145)

## 2022-10-24 LAB — CBC WITH DIFFERENTIAL/PLATELET
Abs Immature Granulocytes: 0.05 10*3/uL (ref 0.00–0.07)
Basophils Absolute: 0 10*3/uL (ref 0.0–0.1)
Basophils Relative: 0 %
Eosinophils Absolute: 0 10*3/uL (ref 0.0–0.5)
Eosinophils Relative: 0 %
HCT: 40.4 % (ref 36.0–46.0)
Hemoglobin: 13 g/dL (ref 12.0–15.0)
Immature Granulocytes: 0 %
Lymphocytes Relative: 8 %
Lymphs Abs: 1.1 10*3/uL (ref 0.7–4.0)
MCH: 29 pg (ref 26.0–34.0)
MCHC: 32.2 g/dL (ref 30.0–36.0)
MCV: 90 fL (ref 80.0–100.0)
Monocytes Absolute: 0.8 10*3/uL (ref 0.1–1.0)
Monocytes Relative: 6 %
Neutro Abs: 11.9 10*3/uL — ABNORMAL HIGH (ref 1.7–7.7)
Neutrophils Relative %: 86 %
Platelets: 273 10*3/uL (ref 150–400)
RBC: 4.49 MIL/uL (ref 3.87–5.11)
RDW: 14 % (ref 11.5–15.5)
WBC: 13.8 10*3/uL — ABNORMAL HIGH (ref 4.0–10.5)
nRBC: 0 % (ref 0.0–0.2)

## 2022-10-24 MED ORDER — HYDROMORPHONE HCL 1 MG/ML IJ SOLN
0.5000 mg | Freq: Once | INTRAMUSCULAR | Status: AC
  Administered 2022-10-24: 0.5 mg via INTRAVENOUS
  Filled 2022-10-24: qty 1

## 2022-10-24 MED ORDER — IOHEXOL 350 MG/ML SOLN
75.0000 mL | Freq: Once | INTRAVENOUS | Status: AC | PRN
  Administered 2022-10-24: 75 mL via INTRAVENOUS

## 2022-10-24 MED ORDER — LACTATED RINGERS IV BOLUS
1000.0000 mL | Freq: Once | INTRAVENOUS | Status: AC
  Administered 2022-10-24: 1000 mL via INTRAVENOUS

## 2022-10-24 NOTE — ED Provider Triage Note (Signed)
Emergency Medicine Provider Triage Evaluation Note  GABBY RACKERS , a 81 y.o. female  was evaluated in triage.  Pt complains of rectal pain.  Patient has taken Benefiber daily for 30 years.  She states she stopped recently doing to be coming becoming incontinent.  Patient takes hydrocodone nightly for chronic back pain.  She states that on Friday she was going to go on a trip and took an Imodium Friday night and had an additional Imodium Saturday morning.  She felt no need to have a bowel movement over the weekend until this morning.  She states that when she went to the bathroom it was exquisitely painful.  She denies any blood in the toilet but looked in the mirror and could see something "gray" in color sticking out through her rectum.  This felt hard when she tried to wipe.  Patient is here complaining of rectal pain  Review of Systems  Positive: As above Negative: As above  Physical Exam  BP (!) 107/59 (BP Location: Right Arm)   Pulse 89   Temp 98 F (36.7 C) (Oral)   Resp 16   SpO2 96%  Gen:   Awake, no distress   Resp:  Normal effort  MSK:   Moves extremities without difficulty  Other:    Medical Decision Making  Medically screening exam initiated at 12:55 PM.  Appropriate orders placed.  KEMIA WENDEL was informed that the remainder of the evaluation will be completed by another provider, this initial triage assessment does not replace that evaluation, and the importance of remaining in the ED until their evaluation is complete.     Dorothyann Peng, PA-C 10/24/22 1258

## 2022-10-24 NOTE — Telephone Encounter (Signed)
Patient called in stating she discontinued her fiber supplement after being on it for 20-30 years d/t her son reading online that it could cause diarrhea. She is now experiencing constipation & severe rectal pain. Denies bleeding. Last bowel movement was yesterday in small balls. States she used a Geologist, engineering today to look & it appears her insides are falling out. While we were on the phone she continued to express how excruciating the pain was & that she felt like her insides were falling out again. Advised that she have a family member drive her to ER or call an ambulance given her pain. Pt verbalized all understanding.

## 2022-10-24 NOTE — ED Notes (Signed)
Enema completed, large amount of stool removed. MD Doren Custard made aware.

## 2022-10-24 NOTE — ED Triage Notes (Signed)
Pt reports taking imodium Friday night and Saturday morning, and now backed up. Pt unsure but states it feels like her rectum is coming out.

## 2022-10-24 NOTE — Telephone Encounter (Signed)
Patient called states she has been having issues with BM's for the past few days. Said she tried to have one this morning and she can't, looked at her rectum with a mirror and she thinks are insides are coming out.

## 2022-10-24 NOTE — Telephone Encounter (Signed)
Patient currently at ED. Will follow up with her once discharged.

## 2022-10-24 NOTE — Telephone Encounter (Signed)
She may have a fissure or thrombosed hemorrhoid in addition to the constipation.  Recommend following: 1.  Resume fiber as she had been on previously 2.  1 dose of MiraLAX daily until easy bowel movements occur 3.  Anusol HC medicated suppositories (or generic compounded equivalent, which would be cheaper); #30; no refills; 1 per rectum at night 4.  Follow-up office appointment with Dr. Fuller Plan next week or two Thanks

## 2022-10-24 NOTE — ED Provider Notes (Signed)
[facility] EMERGENCY DEPARTMENT Provider Note   CSN: [id number] Arrival date & time: 10/24/22  1219     History {Add pertinent medical, surgical, social history, OB history to HPI:1} Chief Complaint  Patient presents with   Rectal Pain    Angel Holland is a 81 y.o. female.  HPI Patient presents for rectal pain.  Medical history includes IBS, DM, atrial fibrillation, HLD, anxiety, arthritis.  She was in her normal state of health up until this morning.  Over the weekend, she visited family for the Christmas holiday.  2 days ago, she took some Imodium because she did not want to use the bathroom well at family's house.  Last bowel movement was likely 4 days ago.  Today, she went to have a bowel movement but experienced severe rectal pain.  Pain has been persistent for the past 6 hours.    Home Medications Prior to Admission medications   Medication Sig Start Date End Date Taking? Authorizing Provider  AMBULATORY NON FORMULARY MEDICATION Medication Name: Osteo Matrix-Shaklee once daily    [provider]  Ascorbic Acid (VITAMIN C PO) Take 1,000 mg by mouth 2 (two) times daily with a meal.    [provider]  aspirin EC 81 MG tablet Take 81 mg by mouth See admin instructions. Take one tablet (81 mg) by mouth every morning, may also take one tablet (81 mg) at night as needed for pain/sleep    [provider]  Cyanocobalamin (VITAMIN B-12 PO) Take 130 mg by mouth daily with supper.    [provider]  dicyclomine (BENTYL) 10 MG capsule TAKE 1 CAPSULE BY MOUTH FOUR TIMES DAILY BEFORE MEALS AND AT BEDTIME 01/17/22   Ladene Artist, MD  diltiazem (CARDIZEM CD) 120 MG 24 hr capsule Take 1 capsule (120 mg total) by mouth daily. 08/22/22   McElwee, Scheryl Darter, NP  fluconazole (DIFLUCAN) 150 MG tablet Take 1 tablet after finishing your antibiotic, you can take a second one in 3 days if needed 04/28/22   McElwee, Lauren A, NP   hydrocodone-ibuprofen (VICOPROFEN) 5-200 MG tablet Take 1 tablet by mouth daily as needed for pain. 07/27/21   Caren Macadam, MD  loperamide (IMODIUM) 2 MG capsule Take 2 mg by mouth daily as needed for diarrhea or loose stools.     [provider]  metroNIDAZOLE (METROGEL) 0.75 % gel Apply 1 application topically every morning. Applies to face for rosacea    [provider]  Multiple Vitamins-Minerals (OCUVITE PO) Take 1 tablet by mouth at bedtime.    [provider]  triamcinolone (NASACORT) 55 MCG/ACT AERO nasal inhaler Place 2 sprays into the nose daily as needed (congestion/sinus headache).     [provider]  VITAMIN D PO Take 1,000 Units by mouth daily with breakfast.    [provider]  Wheat Dextrin (BENEFIBER DRINK MIX PO) Take 15 mLs by mouth 2 (two) times daily.     [provider]      Allergies    Biaxin [clarithromycin], Codeine, Decongestant [pseudoephedrine hcl er], Phenylephrine, Rosuvastatin, and Acetaminophen    Review of Systems   Review of Systems  Gastrointestinal:  Positive for constipation and rectal pain.  All other systems reviewed and are negative.   Physical Exam Updated Vital Signs BP 137/64   Pulse 94   Temp 99.6 F (37.6 C) (Oral)   Resp 11   SpO2 98%  Physical Exam Vitals and nursing note reviewed. Exam conducted  with a chaperone present.  Constitutional:      General: She is not in acute distress.    Appearance: She is well-developed and normal weight. She is not toxic-appearing or diaphoretic.  HENT:     Head: Normocephalic and atraumatic.     Right Ear: External ear normal.     Left Ear: External ear normal.     Nose: Nose normal.     Mouth/Throat:     Mouth: Mucous membranes are moist.     Pharynx: Oropharynx is clear.  Eyes:     Extraocular Movements: Extraocular movements intact.     Conjunctiva/sclera: Conjunctivae normal.  Cardiovascular:     Rate and Rhythm: Normal rate  and regular rhythm.  Pulmonary:     Effort: Pulmonary effort is normal. No respiratory distress.  Abdominal:     General: There is no distension.     Palpations: Abdomen is soft.     Tenderness: There is no abdominal tenderness.  Genitourinary:    Comments: Firm stool ball present in rectal vault.  Patient was unable to tolerate manual disimpaction due to the pain. Musculoskeletal:        General: No swelling.     Cervical back: Normal range of motion and neck supple.  Skin:    General: Skin is warm and dry.  Neurological:     General: No focal deficit present.     Mental Status: She is alert and oriented to person, place, and time.  Psychiatric:        Mood and Affect: Mood normal.        Behavior: Behavior normal.     ED Results / Procedures / Treatments   Labs (all labs ordered are listed, but only abnormal results are displayed) Labs Reviewed  BASIC METABOLIC PANEL - Abnormal; Notable for the following components:      Result Value   Glucose, Bld 117 (*)    All other components within normal limits  CBC WITH DIFFERENTIAL/PLATELET - Abnormal; Notable for the following components:   WBC 13.8 (*)    Neutro Abs 11.9 (*)    All other components within normal limits    EKG None  Radiology DG Abdomen 1 View  Result Date: 10/24/2022 CLINICAL DATA:  Constipation EXAM: ABDOMEN - 1 VIEW COMPARISON:  None Available. FINDINGS: Moderate fecal loading in the colon. No bowel obstruction. An oval collection of air in the mid abdomen is likely in the stomach. No other bony or soft tissue abnormalities are identified. IMPRESSION: Moderate fecal loading in the colon. No other abnormalities. Electronically Signed   By: Dorise Bullion III M.D.   On: 10/24/2022 14:54    Procedures Procedures  {Document cardiac monitor, telemetry assessment procedure when appropriate:1}  Medications Ordered in ED Medications - No data to display  ED Course/ Medical Decision Making/ A&P                            Medical Decision Making  This patient presents to the ED for concern of ***, this involves an extensive number of treatment options, and is a complaint that carries with it a high risk of complications and morbidity.  The differential diagnosis includes ***   Co morbidities that complicate the patient evaluation  ***   Additional history obtained:  Additional history obtained from *** External records from outside source obtained and reviewed including ***   Lab Tests:  I Ordered, and personally interpreted labs.  The pertinent results include:  ***   Imaging Studies ordered:  I ordered imaging studies including ***  I independently visualized and interpreted imaging which showed *** I agree with the radiologist interpretation   Cardiac Monitoring: / EKG:  The patient was maintained on a cardiac monitor.  I personally viewed and interpreted the cardiac monitored which showed an underlying rhythm of: ***   Consultations Obtained:  I requested consultation with the ***,  and discussed lab and imaging findings as well as pertinent plan - they recommend: ***   Problem List / ED Course / Critical interventions / Medication management  Patient presents for rectal pain.  This is in the setting of 4 days without a bowel movement.  2 days ago, she took Imodium to avoid having a bowel movement at family's house during Christmas.  This morning, when she went to have a bowel movement, she was unable to pass much stool and experienced severe rectal pain which has been constant since that time.  On exam, patient is laying in lateral decubitus position.  She appears very uncomfortable.  On inspection of rectal area, she has some output of diarrhea consistent with encopresis.  There is a firm stool ball present in the distal aspect of the rectal vault.  Due to the discomfort, patient was unable to tolerate manual disimpaction.  Pain medicine and IV fluids were ordered.  Plan  will be for trial of enema.***. I ordered medication including ***  for ***  Reevaluation of the patient after these medicines showed that the patient {resolved/improved/worsened:23923::"improved"} I have reviewed the patients home medicines and have made adjustments as needed   Social Determinants of Health:  ***   Test / Admission - Considered:  ***   {Document critical care time when appropriate:1} {Document review of labs and clinical decision tools ie heart score, Chads2Vasc2 etc:1}  {Document your independent review of radiology images, and any outside records:1} {Document your discussion with family members, caretakers, and with consultants:1} {Document social determinants of health affecting pt's care:1} {Document your decision making why or why not admission, treatments were needed:1} Final Clinical Impression(s) / ED Diagnoses Final diagnoses:  None    Rx / DC Orders ED Discharge Orders     None

## 2022-10-25 ENCOUNTER — Telehealth: Payer: Self-pay

## 2022-10-25 DIAGNOSIS — Z87891 Personal history of nicotine dependence: Secondary | ICD-10-CM | POA: Diagnosis not present

## 2022-10-25 DIAGNOSIS — E876 Hypokalemia: Secondary | ICD-10-CM | POA: Diagnosis not present

## 2022-10-25 DIAGNOSIS — I482 Chronic atrial fibrillation, unspecified: Secondary | ICD-10-CM | POA: Diagnosis present

## 2022-10-25 DIAGNOSIS — K5641 Fecal impaction: Secondary | ICD-10-CM | POA: Diagnosis present

## 2022-10-25 DIAGNOSIS — Z86010 Personal history of colonic polyps: Secondary | ICD-10-CM | POA: Diagnosis not present

## 2022-10-25 DIAGNOSIS — K580 Irritable bowel syndrome with diarrhea: Secondary | ICD-10-CM | POA: Diagnosis present

## 2022-10-25 DIAGNOSIS — R1310 Dysphagia, unspecified: Secondary | ICD-10-CM | POA: Diagnosis present

## 2022-10-25 DIAGNOSIS — R194 Change in bowel habit: Secondary | ICD-10-CM | POA: Diagnosis not present

## 2022-10-25 DIAGNOSIS — K5792 Diverticulitis of intestine, part unspecified, without perforation or abscess without bleeding: Secondary | ICD-10-CM | POA: Diagnosis present

## 2022-10-25 DIAGNOSIS — L259 Unspecified contact dermatitis, unspecified cause: Secondary | ICD-10-CM | POA: Diagnosis present

## 2022-10-25 DIAGNOSIS — J069 Acute upper respiratory infection, unspecified: Secondary | ICD-10-CM | POA: Diagnosis not present

## 2022-10-25 DIAGNOSIS — Z808 Family history of malignant neoplasm of other organs or systems: Secondary | ICD-10-CM | POA: Diagnosis not present

## 2022-10-25 DIAGNOSIS — K6289 Other specified diseases of anus and rectum: Secondary | ICD-10-CM | POA: Insufficient documentation

## 2022-10-25 DIAGNOSIS — E119 Type 2 diabetes mellitus without complications: Secondary | ICD-10-CM | POA: Diagnosis present

## 2022-10-25 DIAGNOSIS — K760 Fatty (change of) liver, not elsewhere classified: Secondary | ICD-10-CM | POA: Diagnosis present

## 2022-10-25 DIAGNOSIS — I480 Paroxysmal atrial fibrillation: Secondary | ICD-10-CM | POA: Diagnosis present

## 2022-10-25 DIAGNOSIS — E785 Hyperlipidemia, unspecified: Secondary | ICD-10-CM | POA: Diagnosis present

## 2022-10-25 DIAGNOSIS — E669 Obesity, unspecified: Secondary | ICD-10-CM | POA: Diagnosis present

## 2022-10-25 DIAGNOSIS — U071 COVID-19: Secondary | ICD-10-CM | POA: Diagnosis not present

## 2022-10-25 DIAGNOSIS — L719 Rosacea, unspecified: Secondary | ICD-10-CM | POA: Diagnosis present

## 2022-10-25 DIAGNOSIS — Z8249 Family history of ischemic heart disease and other diseases of the circulatory system: Secondary | ICD-10-CM | POA: Diagnosis not present

## 2022-10-25 DIAGNOSIS — Z634 Disappearance and death of family member: Secondary | ICD-10-CM | POA: Diagnosis not present

## 2022-10-25 DIAGNOSIS — R197 Diarrhea, unspecified: Secondary | ICD-10-CM | POA: Diagnosis not present

## 2022-10-25 DIAGNOSIS — K623 Rectal prolapse: Secondary | ICD-10-CM | POA: Diagnosis present

## 2022-10-25 DIAGNOSIS — K5900 Constipation, unspecified: Secondary | ICD-10-CM | POA: Diagnosis not present

## 2022-10-25 DIAGNOSIS — K644 Residual hemorrhoidal skin tags: Secondary | ICD-10-CM | POA: Diagnosis present

## 2022-10-25 DIAGNOSIS — K648 Other hemorrhoids: Secondary | ICD-10-CM | POA: Diagnosis present

## 2022-10-25 DIAGNOSIS — F410 Panic disorder [episodic paroxysmal anxiety] without agoraphobia: Secondary | ICD-10-CM | POA: Diagnosis present

## 2022-10-25 DIAGNOSIS — K5732 Diverticulitis of large intestine without perforation or abscess without bleeding: Secondary | ICD-10-CM | POA: Diagnosis present

## 2022-10-25 LAB — CBG MONITORING, ED: Glucose-Capillary: 129 mg/dL — ABNORMAL HIGH (ref 70–99)

## 2022-10-25 LAB — CBC
HCT: 37 % (ref 36.0–46.0)
Hemoglobin: 12.4 g/dL (ref 12.0–15.0)
MCH: 29.2 pg (ref 26.0–34.0)
MCHC: 33.5 g/dL (ref 30.0–36.0)
MCV: 87.1 fL (ref 80.0–100.0)
Platelets: 222 10*3/uL (ref 150–400)
RBC: 4.25 MIL/uL (ref 3.87–5.11)
RDW: 14.2 % (ref 11.5–15.5)
WBC: 10.7 10*3/uL — ABNORMAL HIGH (ref 4.0–10.5)
nRBC: 0 % (ref 0.0–0.2)

## 2022-10-25 LAB — URINALYSIS, ROUTINE W REFLEX MICROSCOPIC
Bacteria, UA: NONE SEEN
Bilirubin Urine: NEGATIVE
Glucose, UA: NEGATIVE mg/dL
Hgb urine dipstick: NEGATIVE
Ketones, ur: 5 mg/dL — AB
Leukocytes,Ua: NEGATIVE
Nitrite: NEGATIVE
Protein, ur: NEGATIVE mg/dL
Specific Gravity, Urine: 1.028 (ref 1.005–1.030)
pH: 6 (ref 5.0–8.0)

## 2022-10-25 LAB — BASIC METABOLIC PANEL
Anion gap: 7 (ref 5–15)
BUN: 10 mg/dL (ref 8–23)
CO2: 23 mmol/L (ref 22–32)
Calcium: 8.6 mg/dL — ABNORMAL LOW (ref 8.9–10.3)
Chloride: 106 mmol/L (ref 98–111)
Creatinine, Ser: 0.7 mg/dL (ref 0.44–1.00)
GFR, Estimated: >60 mL/min (ref >60)
Glucose, Bld: 137 mg/dL — ABNORMAL HIGH (ref 70–99)
Potassium: 3.4 mmol/L — ABNORMAL LOW (ref 3.5–5.1)
Sodium: 136 mmol/L (ref 135–145)

## 2022-10-25 LAB — GLUCOSE, CAPILLARY
Glucose-Capillary: 125 mg/dL — ABNORMAL HIGH (ref 70–99)
Glucose-Capillary: 124 mg/dL — ABNORMAL HIGH (ref 70–99)
Glucose-Capillary: 145 mg/dL — ABNORMAL HIGH (ref 70–99)

## 2022-10-25 MED ORDER — POTASSIUM CHLORIDE CRYS ER 20 MEQ PO TBCR
40.0000 meq | EXTENDED_RELEASE_TABLET | Freq: Once | ORAL | Status: AC
  Administered 2022-10-25: 40 meq via ORAL
  Filled 2022-10-25: qty 2

## 2022-10-25 MED ORDER — SODIUM CHLORIDE 0.9 % IV SOLN: INTRAVENOUS | Status: AC

## 2022-10-25 MED ORDER — ENOXAPARIN SODIUM 40 MG/0.4ML IJ SOSY
40.0000 mg | PREFILLED_SYRINGE | INTRAMUSCULAR | Status: DC
  Administered 2022-10-25 – 2022-11-01 (×8): 40 mg via SUBCUTANEOUS
  Filled 2022-10-25 (×7): qty 0.4

## 2022-10-25 MED ORDER — HYDROMORPHONE HCL 1 MG/ML IJ SOLN
0.5000 mg | Freq: Once | INTRAMUSCULAR | Status: AC
  Administered 2022-10-25: 0.5 mg via INTRAVENOUS
  Filled 2022-10-25: qty 1

## 2022-10-25 MED ORDER — METRONIDAZOLE 500 MG/100ML IV SOLN
500.0000 mg | Freq: Two times a day (BID) | INTRAVENOUS | Status: AC
  Administered 2022-10-25 – 2022-10-31 (×14): 500 mg via INTRAVENOUS
  Filled 2022-10-25 (×13): qty 100

## 2022-10-25 MED ORDER — ONDANSETRON HCL 4 MG/2ML IJ SOLN
4.0000 mg | Freq: Four times a day (QID) | INTRAMUSCULAR | Status: DC | PRN
  Administered 2022-10-29 – 2022-10-30 (×4): 4 mg via INTRAVENOUS
  Filled 2022-10-25 (×6): qty 2

## 2022-10-25 MED ORDER — POLYETHYLENE GLYCOL 3350 17 GM/SCOOP PO POWD
1.0000 | Freq: Once | ORAL | Status: AC
  Administered 2022-10-25: 255 g via ORAL
  Filled 2022-10-25: qty 255

## 2022-10-25 MED ORDER — SODIUM CHLORIDE 0.9 % IV SOLN: INTRAVENOUS | Status: DC

## 2022-10-25 MED ORDER — TRAMADOL HCL 50 MG PO TABS
50.0000 mg | ORAL_TABLET | Freq: Three times a day (TID) | ORAL | Status: DC | PRN
  Administered 2022-10-25 – 2022-11-01 (×13): 50 mg via ORAL
  Filled 2022-10-25 (×15): qty 1

## 2022-10-25 MED ORDER — SODIUM CHLORIDE 0.9 % IV SOLN
2.0000 g | Freq: Two times a day (BID) | INTRAVENOUS | Status: AC
  Administered 2022-10-25 – 2022-10-31 (×14): 2 g via INTRAVENOUS
  Filled 2022-10-25 (×14): qty 12.5

## 2022-10-25 MED ORDER — INSULIN ASPART 100 UNIT/ML IJ SOLN
0.0000 [IU] | Freq: Three times a day (TID) | INTRAMUSCULAR | Status: DC
  Administered 2022-10-25 – 2022-10-27 (×5): 1 [IU] via SUBCUTANEOUS
  Administered 2022-10-27: 2 [IU] via SUBCUTANEOUS
  Administered 2022-10-28: 1 [IU] via SUBCUTANEOUS
  Administered 2022-10-29: 2 [IU] via SUBCUTANEOUS
  Administered 2022-10-30 – 2022-10-31 (×2): 1 [IU] via SUBCUTANEOUS
  Administered 2022-10-31: 2 [IU] via SUBCUTANEOUS
  Administered 2022-11-01 (×2): 1 [IU] via SUBCUTANEOUS
  Administered 2022-11-02: 2 [IU] via SUBCUTANEOUS

## 2022-10-25 MED ORDER — DILTIAZEM HCL ER COATED BEADS 120 MG PO CP24
120.0000 mg | ORAL_CAPSULE | Freq: Every day | ORAL | Status: DC
  Administered 2022-10-25 – 2022-11-02 (×9): 120 mg via ORAL
  Filled 2022-10-25 (×10): qty 1

## 2022-10-25 MED ORDER — SODIUM CHLORIDE 0.9 % IV SOLN
2.0000 g | Freq: Once | INTRAVENOUS | Status: AC
  Administered 2022-10-25: 2 g via INTRAVENOUS
  Filled 2022-10-25: qty 12.5

## 2022-10-25 MED ORDER — HYDROMORPHONE HCL 1 MG/ML IJ SOLN
0.5000 mg | INTRAMUSCULAR | Status: DC | PRN
  Administered 2022-10-25 – 2022-10-30 (×3): 0.5 mg via INTRAVENOUS
  Filled 2022-10-25 (×4): qty 0.5

## 2022-10-25 MED ORDER — NUTRISOURCE FIBER PO PACK
PACK | Freq: Two times a day (BID) | ORAL | Status: DC
  Filled 2022-10-25: qty 1

## 2022-10-25 MED ORDER — ASPIRIN 81 MG PO TBEC: 81.0000 mg | DELAYED_RELEASE_TABLET | ORAL | Status: DC

## 2022-10-25 NOTE — Telephone Encounter (Signed)
OV scheduled for 11/15/22 at 3:00 pm with Dr. Fuller Plan. Patient is currently admitted. Appointment will appear on discharge AVS.

## 2022-10-25 NOTE — Care Management (Signed)
  Transition of Care [facility]) Screening Note   Patient Details  Name: Angel Holland Date of Birth: [birth year]   Transition of [facility] CM/SW Contact:    Carles Collet, RN Phone Number: 10/25/2022, 12:39 PM    Transition of Care Department [facility]) has reviewed patient and no TOC needs have been identified at this time. We will continue to monitor patient advancement through interdisciplinary progression rounds. If new patient transition needs arise, please place a TOC consult.

## 2022-10-25 NOTE — Progress Notes (Signed)
Patient ID: Angel Holland, female   DOB: 1941-01-19, 81 y.o.   MRN: [id number]  Report received from ED RN: "good morning, after reviewing the pts chart let me know if you have questions, she is AO, on O2, no insulin needed this am, only complaint to me has been a headache." -Nichola Sizer RN   Will review chart for further information.  Angel Salter, RN

## 2022-10-25 NOTE — Progress Notes (Addendum)
PROGRESS NOTE    Angel Holland  YOV:[id number] DOB: 04/30/1941 DOA: 10/24/2022 PCP: Charyl Dancer, NP   Brief Narrative: 81 year old with past medical history significant for IBS-D, diabetes type 2, A-fib presented to the ED complaining of rectal pain.  She has been taking fiber pills for more than 20 years, she usually have diarrhea.  After she discontinue fiber pills she developed constipation.  Patient took some Imodium, to avoid having diarrhea at a  family house.  Last bowel movement 4 days prior to admission.  She developed the day prior to admission severe rectal pain lasted for 6 hours.  Evaluation in the ED she was found to have mild leukocytosis, CT scan showed large diverticulum with superimposed diverticulitis.   Assessment & Plan:   Principal Problem:   Acute diverticulitis Active Problems:   Diabetes mellitus type II, controlled ([facility])   Rectal pain   Constipation   Atrial fibrillation, chronic ([facility])   1-Acute Diverticulitis: Continue with cefepime and Flagyl Report  improvement of abdominal pain  Rectal Pain,  Suspect related to fecal impaction.  Resolved after enema GI order Miralax.   Chronic A fib: On Cardizem and baby aspirin  DM type 2:  Continue with a sliding scale insulin  Hypokalemia; replete orally.    Estimated body mass index is 26.13 kg/m as calculated from the following:   Height as of 01/26/22: '4\' 10"'$  (1.473 m).   Weight as of 07/28/22: 56.7 kg.   DVT prophylaxis: Lovenox Code Status: Full code Family Communication: Care discussed with patient Disposition Plan:  Status is: Observation The patient will require care spanning > 2 midnights and should be moved to inpatient because: Management of acute diverticulitis    Consultants:  GI  Procedures:  none  Antimicrobials:    Subjective: She report improvement of rectal pain after she had enema and BM.  Mild abdominal soreness.    Objective: Vitals:   10/25/22 0545  10/25/22 0600 10/25/22 0615 10/25/22 0630  BP: 110/66 124/66 (!) 130/58 111/78  Pulse: 73 82 78 78  Resp: '15 13 14 13  '$ Temp:      TempSrc:      SpO2: 99% 99% 99% 99%    Intake/Output Summary (Last 24 hours) at 10/25/2022 0700 Last data filed at 10/25/2022 0146 Gross per 24 hour  Intake --  Output 1900 ml  Net -1900 ml   There were no vitals filed for this visit.  Examination:  General exam: Appears calm and comfortable  Respiratory system: Clear to auscultation. Respiratory effort normal. Cardiovascular system: S1 & S2 heard, RRR. No JVD, murmurs, rubs, gallops or clicks. No pedal edema. Gastrointestinal system: Abdomen is nondistended, soft andmild tender.  Central nervous system: Alert and oriented. No focal neurological deficits. Extremities: Symmetric 5 x 5 power.   Data Reviewed: I have personally reviewed following labs and imaging studies  CBC: Recent Labs  Lab 10/24/22 1320  WBC 13.8*  NEUTROABS 11.9*  HGB 13.0  HCT 40.4  MCV 90.0  PLT 741   Basic Metabolic Panel: Recent Labs  Lab 10/24/22 1320  NA 140  K 4.0  CL 106  CO2 24  GLUCOSE 117*  BUN 14  CREATININE 0.78  CALCIUM 9.3   GFR: CrCl cannot be calculated (Unknown ideal weight.). Liver Function Tests: No results for input(s): "AST", "ALT", "ALKPHOS", "BILITOT", "PROT", "ALBUMIN" in the last 168 hours. No results for input(s): "LIPASE", "AMYLASE" in the last 168 hours. No results for input(s): "AMMONIA" in the last  168 hours. Coagulation Profile: No results for input(s): "INR", "PROTIME" in the last 168 hours. Cardiac Enzymes: No results for input(s): "CKTOTAL", "CKMB", "CKMBINDEX", "TROPONINI" in the last 168 hours. BNP (last 3 results) No results for input(s): "PROBNP" in the last 8760 hours. HbA1C: No results for input(s): "HGBA1C" in the last 72 hours. CBG: No results for input(s): "GLUCAP" in the last 168 hours. Lipid Profile: No results for input(s): "CHOL", "HDL", "LDLCALC",  "TRIG", "CHOLHDL", "LDLDIRECT" in the last 72 hours. Thyroid Function Tests: No results for input(s): "TSH", "T4TOTAL", "FREET4", "T3FREE", "THYROIDAB" in the last 72 hours. Anemia Panel: No results for input(s): "VITAMINB12", "FOLATE", "FERRITIN", "TIBC", "IRON", "RETICCTPCT" in the last 72 hours. Sepsis Labs: No results for input(s): "PROCALCITON", "LATICACIDVEN" in the last 168 hours.  No results found for this or any previous visit (from the past 240 hour(s)).       Radiology Studies: CT ABDOMEN PELVIS W CONTRAST  Result Date: 10/24/2022 CLINICAL DATA:  Acute abdominal pain EXAM: CT ABDOMEN AND PELVIS WITH CONTRAST TECHNIQUE: Multidetector CT imaging of the abdomen and pelvis was performed using the standard protocol following bolus administration of intravenous contrast. RADIATION DOSE REDUCTION: This exam was performed according to the departmental dose-optimization program which includes automated exposure control, adjustment of the mA and/or kV according to patient size and/or use of iterative reconstruction technique. CONTRAST:  47m OMNIPAQUE IOHEXOL 350 MG/ML SOLN COMPARISON:  12/14/2017 FINDINGS: Lower chest: Patchy basilar atelectasis is noted. No sizable effusion is seen. Hepatobiliary: Fatty infiltration of the liver is noted. The gallbladder has been surgically removed. Pancreas: Unremarkable. No pancreatic ductal dilatation or surrounding inflammatory changes. Spleen: Normal in size without focal abnormality. Adrenals/Urinary Tract: Adrenal glands are within normal limits. Kidneys demonstrate prominent extrarenal pelves without obstructive change. No calculi are noted. The bladder is well distended. Stomach/Bowel: Findings suspicious for rectal prolapse are noted. Diverticulosis is seen. There is a large pericolonic collection of fecal material which may represent a large diverticulum or possibly pericolonic abscess. This is larger than that noted in 2019 and currently measures  approximately 4.7 cm in greatest dimension. Pericolonic inflammatory change consistent with diverticulitis is noted. The appendix has been surgically removed. Small bowel and stomach are within normal limits. Vascular/Lymphatic: Aortic atherosclerosis. No enlarged abdominal or pelvic lymph nodes. Reproductive: Fluid attenuation is noted within the endometrial cavity of uncertain significance. This is somewhat suspicious given the patient's age. No adnexal mass is noted. Other: No abdominal wall hernia or abnormality. No abdominopelvic ascites. Musculoskeletal: No acute or significant osseous findings. IMPRESSION: Well-defined collection of fecal material adjacent to the colon with surrounding inflammatory change. This likely represents a large diverticulum given its chronic nature with superimposed diverticulitis on the current exam. Fluid attenuation within the endometrial canal somewhat suspicious given the patient's age. Further workup is necessary. Findings suggestive of rectal prolapse Fatty liver. Electronically Signed   By: MInez CatalinaM.D.   On: 10/24/2022 21:56   DG Abdomen 1 View  Result Date: 10/24/2022 CLINICAL DATA:  Constipation EXAM: ABDOMEN - 1 VIEW COMPARISON:  None Available. FINDINGS: Moderate fecal loading in the colon. No bowel obstruction. An oval collection of air in the mid abdomen is likely in the stomach. No other bony or soft tissue abnormalities are identified. IMPRESSION: Moderate fecal loading in the colon. No other abnormalities. Electronically Signed   By: DDorise BullionIII M.D.   On: 10/24/2022 14:54        Scheduled Meds:  Benefiber Drink Mix   Oral BID  enoxaparin (LOVENOX) injection  40 mg Subcutaneous Q24H   insulin aspart  0-9 Units Subcutaneous TID WC   Continuous Infusions:  sodium chloride     ceFEPime (MAXIPIME) IV     metronidazole Stopped (10/25/22 0302)     LOS: 0 days    Time spent: 35 minutes    Analayah Brooke A Yamilett Anastos, MD [facility]   If 7PM-7AM, please contact night-coverage [email or web address]  10/25/2022, 7:00 AM

## 2022-10-25 NOTE — Telephone Encounter (Signed)
-----   Message from Sharyn Creamer, MD sent at 10/25/2022  9:37 AM EST ----- Hi, please arrange for GI clinic follow up in 3-4 weeks for diverticulitis with Dr. Fuller Plan or APP. Thanks.

## 2022-10-25 NOTE — H&P (Signed)
History and Physical    Patient: Angel Holland DOB: [birth year] DOA: 10/24/2022 DOS: the patient was seen and examined on 10/25/2022 PCP: Charyl Dancer, NP  Patient coming from: Home  Chief Complaint:  Chief Complaint  Patient presents with   Rectal Pain   HPI: Angel Holland is a 81 y.o. female with medical history significant of IBS-D, T2DM, atrial fibrillation who presents to the emergency department due to rectal pain.  Patient states that she has been taking fiber pills for more than 20 years and this has resulted in her having diarrhea.  Her son told her that the fiber pills was possibly because of her chronic diarrhea, so she stopped taking it and shortly after this, she started to have constipation.  Due to need to spend time with some family members during the Christmas and did not want to have to use the bathroom at the family's house, she took 2 Imodium tablets.  Last bowel movement was about 4 days ago.  Yesterday, she developed a severe rectal pain while trying to have a bowel movement, and the pain has been persistent for about 6 hours, associated to go to the ED for further evaluation and management.  ED Course:  In the emergency department, temperature was 99.6 F, respiratory rate was 22/min, pulse 96 bpm, BP 156/80, O2 sat was 97% on room air.  Workup in the ED showed normal CBC except for leukocytosis with a left shift.  BMP was normal except for blood glucose of 117.  Urinalysis was normal. CT abdomen and pelvis with contrast showed well-defined collection of fecal material adjacent to the colon with surrounding inflammatory change. This likely represents a large diverticulum given its chronic nature with superimposed diverticulitis on the current exam. Fluid attenuation within the endometrial canal somewhat suspicious given the patient's age. Further workup is necessary. Findings suggestive of rectal prolapse. Fatty liver. She was treated with Flagyl and  cefepime, IV Dilaudid was provided and IV hydration was given.  Hospitalist was asked admit patient for further evaluation and management.  Review of Systems: Review of systems as noted in the HPI. All other systems reviewed and are negative.   Past Medical History:  Diagnosis Date   Anxiety    no meds   Arthritis    back and fingers    Bruxism    Cataract    baby cataracts per pt.   Colon polyps    DDD (degenerative disc disease), lumbar    DDD (degenerative disc disease), lumbosacral    Depression    no meds   Diverticulosis of colon (without mention of hemorrhage)    Fibroid uterus    Headache, paroxysmal hemicrania, episodic 03/24/2014   Hemorrhoids    History of D&C    x2   History of night sweats    History of rectal bleeding    08/2017   Hx of diverticulitis of colon 03/30/2014   Hyperglycemia    Irritable bowel syndrome    PAF (paroxysmal atrial fibrillation) ([place]) 01/2018   pt declined [facility]   Pulmonary nodules - reports followed by pulmonology, reports following up with them in June 2015 03/26/2013   Repeat CT chest 04/20/14 > 1. Scattered pulmonary nodules measure 4 mm or less in size, are unchanged from 03/28/2013 and are therefore considered benign     Rosacea    Tobacco abuse    Unspecified hemorrhoids without mention of complication    Past Surgical History:  Procedure Laterality Date  APPENDECTOMY  1957   bce     CATARACT EXTRACTION     Left June 2021-Right 05/2020   CATARACT EXTRACTION, BILATERAL     CHOLECYSTECTOMY  2009   DILATION AND CURETTAGE OF UTERUS     fibroid removed from uterus     HYSTEROSCOPY WITH D & C N/A 09/09/2015   Procedure: DILATATION AND CURETTAGE /HYSTEROSCOPY;  Surgeon: Vanessa Kick, MD;  Location: [facility];  Service: Gynecology;  Laterality: N/A;    Social History:  reports that she quit smoking about 14 years ago. Her smoking use included cigarettes. She has a 50.00 pack-year smoking history. She has never used smokeless  tobacco. She reports that she does not drink alcohol and does not use drugs.   Allergies  Allergen Reactions   Biaxin [Clarithromycin] Other (See Comments)    BREASTS INFLAMED   Codeine Other (See Comments)    PANIC ATTACKS, CAN'T SLEEP   Decongestant [Pseudoephedrine Hcl Er] Other (See Comments)    Cannot take with Dilitiazem   Phenylephrine Other (See Comments)    Cannot take with Dilitiazem   Rosuvastatin Nausea And Vomiting and Other (See Comments)   Acetaminophen Other (See Comments)    Triggered her "heart problem". Referring to atrial fibrillation    Family History  Problem Relation Age of Onset   Heart disease Mother        CHF   Diverticulosis Mother    Bone cancer Father        found in his mouth first   Colon cancer Neg Hx    Stomach cancer Neg Hx    Esophageal cancer Neg Hx    Pancreatic cancer Neg Hx    Colon polyps Neg Hx      Prior to Admission medications   Medication Sig Start Date End Date Taking? Authorizing Provider  AMBULATORY NON FORMULARY MEDICATION Take 2 capsules by mouth daily. Medication Name: Osteo Matrix-Shaklee   Yes [provider]  Ascorbic Acid (VITAMIN C PO) Take 1,500 mg by mouth 2 (two) times daily with a meal.   Yes [provider]  aspirin EC 81 MG tablet Take 81 mg by mouth See admin instructions. Take one tablet (81 mg) by mouth every morning, may also take one tablet (81 mg) at night as needed for pain/sleep   Yes [provider]  Cyanocobalamin (VITAMIN B-12 PO) Take 130 mg by mouth daily with supper.   Yes [provider]  dicyclomine (BENTYL) 10 MG capsule TAKE 1 CAPSULE BY MOUTH FOUR TIMES DAILY BEFORE MEALS AND AT BEDTIME Patient taking differently: Take 10 mg by mouth daily. 01/17/22  Yes Ladene Artist, MD  diltiazem (CARDIZEM CD) 120 MG 24 hr capsule Take 1 capsule (120 mg total) by mouth daily. 08/22/22  Yes McElwee, Lauren A, NP  hydrocodone-ibuprofen (VICOPROFEN) 5-200 MG tablet Take 1  tablet by mouth daily as needed for pain. Patient taking differently: Take 1 tablet by mouth at bedtime as needed for pain (for back pain, which helps with insomnia). 07/27/21  Yes Koberlein, Steele Berg, MD  loperamide (IMODIUM) 2 MG capsule Take 2 mg by mouth daily as needed for diarrhea or loose stools.    Yes [provider]  metroNIDAZOLE (METROGEL) 0.75 % gel Apply 1 application topically every morning. Applies to face for rosacea   Yes [provider]  Multiple Vitamins-Minerals (OCUVITE PO) Take 1 tablet by mouth at bedtime.   Yes [provider]  triamcinolone (NASACORT) 55 MCG/ACT AERO nasal inhaler  Place 2 sprays into the nose daily as needed (congestion/sinus headache).    Yes [provider]  VITAMIN D PO Take 1,000 Units by mouth daily with breakfast.   Yes [provider]  fluconazole (DIFLUCAN) 150 MG tablet Take 1 tablet after finishing your antibiotic, you can take a second one in 3 days if needed Patient not taking: Reported on 10/25/2022 04/28/22   McElwee, Lauren A, NP  Wheat Dextrin (BENEFIBER DRINK MIX PO) Take 15 mLs by mouth 2 (two) times daily.  Patient not taking: Reported on 10/25/2022    [provider]    Physical Exam: BP (!) 105/53   Pulse 81   Temp 97.8 F (36.6 C) (Oral)   Resp 18   SpO2 99%   General: 81 y.o. year-old female well developed well nourished in no acute distress.  Alert and oriented x3. HEENT: NCAT, EOMI Neck: Supple, trachea medial Cardiovascular: Regular rate and rhythm with no rubs or gallops.  No thyromegaly or JVD noted.  No lower extremity edema. 2/4 pulses in all 4 extremities. Respiratory: Clear to auscultation with no wheezes or rales. Good inspiratory effort. Abdomen: Soft, nontender nondistended with normal bowel sounds x4 quadrants. Muskuloskeletal: No cyanosis, clubbing or edema noted bilaterally Neuro: CN II-XII intact, strength 5/5 x 4, sensation, reflexes intact Skin: No  ulcerative lesions noted or rashes Psychiatry: Judgement and insight appear normal. Mood is appropriate for condition and setting          Labs on Admission:  Basic Metabolic Panel: Recent Labs  Lab 10/24/22 1320  NA 140  K 4.0  CL 106  CO2 24  GLUCOSE 117*  BUN 14  CREATININE 0.78  CALCIUM 9.3   Liver Function Tests: No results for input(s): "AST", "ALT", "ALKPHOS", "BILITOT", "PROT", "ALBUMIN" in the last 168 hours. No results for input(s): "LIPASE", "AMYLASE" in the last 168 hours. No results for input(s): "AMMONIA" in the last 168 hours. CBC: Recent Labs  Lab 10/24/22 1320  WBC 13.8*  NEUTROABS 11.9*  HGB 13.0  HCT 40.4  MCV 90.0  PLT 273   Cardiac Enzymes: No results for input(s): "CKTOTAL", "CKMB", "CKMBINDEX", "TROPONINI" in the last 168 hours.  BNP (last 3 results) No results for input(s): "BNP" in the last 8760 hours.  ProBNP (last 3 results) No results for input(s): "PROBNP" in the last 8760 hours.  CBG: No results for input(s): "GLUCAP" in the last 168 hours.  Radiological Exams on Admission: CT ABDOMEN PELVIS W CONTRAST  Result Date: 10/24/2022 CLINICAL DATA:  Acute abdominal pain EXAM: CT ABDOMEN AND PELVIS WITH CONTRAST TECHNIQUE: Multidetector CT imaging of the abdomen and pelvis was performed using the standard protocol following bolus administration of intravenous contrast. RADIATION DOSE REDUCTION: This exam was performed according to the departmental dose-optimization program which includes automated exposure control, adjustment of the mA and/or kV according to patient size and/or use of iterative reconstruction technique. CONTRAST:  55m OMNIPAQUE IOHEXOL 350 MG/ML SOLN COMPARISON:  12/14/2017 FINDINGS: Lower chest: Patchy basilar atelectasis is noted. No sizable effusion is seen. Hepatobiliary: Fatty infiltration of the liver is noted. The gallbladder has been surgically removed. Pancreas: Unremarkable. No pancreatic ductal dilatation or  surrounding inflammatory changes. Spleen: Normal in size without focal abnormality. Adrenals/Urinary Tract: Adrenal glands are within normal limits. Kidneys demonstrate prominent extrarenal pelves without obstructive change. No calculi are noted. The bladder is well distended. Stomach/Bowel: Findings suspicious for rectal prolapse are noted. Diverticulosis is seen. There is a large pericolonic collection of fecal material  which may represent a large diverticulum or possibly pericolonic abscess. This is larger than that noted in 2019 and currently measures approximately 4.7 cm in greatest dimension. Pericolonic inflammatory change consistent with diverticulitis is noted. The appendix has been surgically removed. Small bowel and stomach are within normal limits. Vascular/Lymphatic: Aortic atherosclerosis. No enlarged abdominal or pelvic lymph nodes. Reproductive: Fluid attenuation is noted within the endometrial cavity of uncertain significance. This is somewhat suspicious given the patient's age. No adnexal mass is noted. Other: No abdominal wall hernia or abnormality. No abdominopelvic ascites. Musculoskeletal: No acute or significant osseous findings. IMPRESSION: Well-defined collection of fecal material adjacent to the colon with surrounding inflammatory change. This likely represents a large diverticulum given its chronic nature with superimposed diverticulitis on the current exam. Fluid attenuation within the endometrial canal somewhat suspicious given the patient's age. Further workup is necessary. Findings suggestive of rectal prolapse Fatty liver. Electronically Signed   By: Inez Catalina M.D.   On: 10/24/2022 21:56   DG Abdomen 1 View  Result Date: 10/24/2022 CLINICAL DATA:  Constipation EXAM: ABDOMEN - 1 VIEW COMPARISON:  None Available. FINDINGS: Moderate fecal loading in the colon. No bowel obstruction. An oval collection of air in the mid abdomen is likely in the stomach. No other bony or soft tissue  abnormalities are identified. IMPRESSION: Moderate fecal loading in the colon. No other abnormalities. Electronically Signed   By: Dorise Bullion III M.D.   On: 10/24/2022 14:54    EKG: I independently viewed the EKG done and my findings are as followed: EKG was not done in the ED  Assessment/Plan Present on Admission:  Acute diverticulitis  Principal Problem:   Acute diverticulitis Active Problems:   Diabetes mellitus type II, controlled ([place])   Rectal pain   Constipation   Atrial fibrillation, chronic ([place])  Acute diverticulitis CT abdomen and pelvis was suggestive of acute diverticulitis Patient was started on IV cefepime and Flagyl, we shall continue same at this time Continue IV Dilaudid 0.5 mg every 3 hours as needed for moderate/severe pain Continue IV hydration  Rectal pain possibly due to anal fissure or thrombosed hemorrhoid Constipation Gastroenterologist was consulted and recommendation is as follows: Resume fiber per previous home regimen 2. Continue 1 dose of MiraLAX daily until his bowel movements alcohol 3.  Anusol HC medicated suppositories (or generic compounded equivalent, which would be cheaper); #30; no refills; 1 per rectum at night 4.  Follow-up office appointment with Dr. Fuller Plan next week or two  Gastroenterology consult was appreciated  Chronic atrial fibrillation Cardizem will be held at this time due to soft BP  T2DM Hemoglobin A1c on 07/28/2022 was 6.6 There was no antidiabetic medication on patient's home med rec Continue ISS and hypoglycemia protocol  DVT prophylaxis: Lovenox  Code Status: Full code  Family Communication: None at bedside  Consults: Neurology by ED team  Severity of Illness: The appropriate patient status for this patient is OBSERVATION. Observation status is judged to be reasonable and necessary in order to provide the required intensity of service to ensure the patient's safety. The patient's presenting symptoms, physical  exam findings, and initial radiographic and laboratory data in the context of their medical condition is felt to place them at decreased risk for further clinical deterioration. Furthermore, it is anticipated that the patient will be medically stable for discharge from the hospital within 2 midnights of admission.   Author: Bernadette Hoit, DO 10/25/2022 6:40 AM  For on call review [email or web address].

## 2022-10-25 NOTE — ED Notes (Signed)
Patient eating breakfast. °

## 2022-10-25 NOTE — ED Notes (Signed)
Patient awake and alert, no s/s of distress, will continue to monitor.

## 2022-10-25 NOTE — Consult Note (Signed)
[facility] Consult: 10:27 AM 10/25/2022  LOS: 0 days    Referring Provider: Dr Tyrell Antonio.    Primary Care Physician:  Charyl Dancer, NP Primary Gastroenterologist:  Dr. Fuller Plan.  Has 11/15/2022 appointment with him    Reason for Consultation:  ?  Diverticulitis.   Constipation.  Rectal pain.   HPI: Angel Holland is a 81 y.o. female.  PMH IBS, diarrhea prone.  Anxiety, panic attacks.  DDD.  PAF, not on [place].  Rosacea. 2009 cholecystectomy.  Appendectomy.  2016 hysteroscopy, D&C, endometrial polyp.  07/2006 colonoscopy.  For hematochezia.  5 mm sessile polyp removed from rectum (Polypoid colorectal mucosa without adenomatous changes).  Diverticulosis associated with thickened house droll folds and spasm in the sigmoid.  Internal and external, nonthrombosed, nonbleeding hemorrhoids. 11/2017 colonoscopy for evaluation hematochezia, positive Cologuard and irregular appearance of sigmoid on CT scan.  Dr. Fuller Plan noted severe left colon diverticulosis associated with colonic narrowing, spasm, peridiverticular erythema.  No bleeding.  Nonbleeding internal hemorrhoids. Random bxs.  Path: Colonic mucosa, no specific histopathologic changes.  No inflammation, increased lymphocytes or subepithelial collagen thickening  For a few years patient's been taking Benefiber.  She still has occasional loose stools with fecal incontinence.  Her son researched Benefiber which said it caused diarrhea so the patient stopped taking her Benefiber on 10/09/2022.  For about 6 months she has been taking 1 hydrocodone at night for significant low back pain.  After stopping the Imodium, she did not have significant diarrhea and actually went in a constipation direction.  Constipation was exacerbated by the fact that on Friday and again on Saturday she  took a single doses Imodium because she was at her son's and she wanted to prevent any fecal incontinence accidents/diarrhea.  As a result for the last 5 or 6 days she has only had a small amount of brown stool output.  She took a Geologist, engineering to look at her rectum and saw the rectum was open and she saw what looked like a gray substance at the rectal opening which she said did not look like stool.  She is also developed significant rectal pain, spastic in character but no visceral/intestinal pain.  The rectal pain was so excruciating she came to the ED yesterday.  Denies fevers, chills, rigors.  Other GI issues include dysphagia when she takes her vitamins.  Weight is stable.  Appetite variable, unchanged.  Afebrile.  Blood pressure soft in 1 teens/50s.  Room air sats mid to upper 90%.   WBC 13.8.  Hb 13.  Platelets 273.  MCV 90. Other than glucose 117, be met normal. UA reveals proteinuria, moderate leukocytes, no nitrites, no bacteria.  urine culture in progress. CTAP w contrast: Well-defined collection fecal material adjacent to the colon with surrounding inflammatory change.  Likely represents large diverticulum given chronicity.  Suspect superimposed diverticulitis.  Fluid at endometrial canal suspicious given patient's age, further workup "necessary".  Rectal prolapse.  Fatty liver.  Surgically absent appendix  In the emergency room and enema was administered resulting in a large amount of  stool output.  Foley catheter was placed as she was having difficulty urinating.  Family history of oral cancer in her father who died at 27.  Mother died at 77 from congestive heart failure. Widowed x 2 years.  Has 2 children.  Not particularly close to her children but she does call on her son for assistance at times. No alcohol, no tobacco products.   Past Medical History:  Diagnosis Date   Anxiety    no meds   Arthritis    back and fingers    Bruxism    Cataract    baby cataracts per pt.   Colon polyps     DDD (degenerative disc disease), lumbar    DDD (degenerative disc disease), lumbosacral    Depression    no meds   Diverticulosis of colon (without mention of hemorrhage)    Fibroid uterus    Headache, paroxysmal hemicrania, episodic 03/24/2014   Hemorrhoids    History of D&C    x2   History of night sweats    History of rectal bleeding    08/2017   Hx of diverticulitis of colon 03/30/2014   Hyperglycemia    Irritable bowel syndrome    PAF (paroxysmal atrial fibrillation) ([place]) 01/2018   pt declined [place]   Pulmonary nodules - reports followed by pulmonology, reports following up with them in June 2015 03/26/2013   Repeat CT chest 04/20/14 > 1. Scattered pulmonary nodules measure 4 mm or less in size, are unchanged from 03/28/2013 and are therefore considered benign     Rosacea    Tobacco abuse    Unspecified hemorrhoids without mention of complication     Past Surgical History:  Procedure Laterality Date   APPENDECTOMY  1957   bce     CATARACT EXTRACTION     Left June 2021-Right 05/2020   CATARACT EXTRACTION, BILATERAL     CHOLECYSTECTOMY  2009   DILATION AND CURETTAGE OF UTERUS     fibroid removed from uterus     HYSTEROSCOPY WITH D & C N/A 09/09/2015   Procedure: DILATATION AND CURETTAGE /HYSTEROSCOPY;  Surgeon: Vanessa Kick, MD;  Location: [facility];  Service: Gynecology;  Laterality: N/A;    Prior to Admission medications   Medication Sig Start Date End Date Taking? Authorizing Provider  AMBULATORY NON FORMULARY MEDICATION Take 2 capsules by mouth daily. Medication Name: Osteo Matrix-Shaklee   Yes [provider]  Ascorbic Acid (VITAMIN C PO) Take 1,500 mg by mouth 2 (two) times daily with a meal.   Yes [provider]  aspirin EC 81 MG tablet Take 81 mg by mouth See admin instructions. Take one tablet (81 mg) by mouth every morning, may also take one tablet (81 mg) at night as needed for pain/sleep   Yes [provider]  Cyanocobalamin (VITAMIN  B-12 PO) Take 130 mg by mouth daily with supper.   Yes [provider]  dicyclomine (BENTYL) 10 MG capsule TAKE 1 CAPSULE BY MOUTH FOUR TIMES DAILY BEFORE MEALS AND AT BEDTIME Patient taking differently: Take 10 mg by mouth daily. 01/17/22  Yes Ladene Artist, MD  diltiazem (CARDIZEM CD) 120 MG 24 hr capsule Take 1 capsule (120 mg total) by mouth daily. 08/22/22  Yes McElwee, Lauren A, NP  hydrocodone-ibuprofen (VICOPROFEN) 5-200 MG tablet Take 1 tablet by mouth daily as needed for pain. Patient taking differently: Take 1 tablet by mouth at bedtime as needed for pain (for back pain, which helps with insomnia). 07/27/21  Yes Koberlein, Junell C, MD  loperamide (IMODIUM) 2 MG capsule Take 2 mg by mouth daily as needed for diarrhea or loose stools.    Yes [provider]  metroNIDAZOLE (METROGEL) 0.75 % gel Apply 1 application topically every morning. Applies to face for rosacea   Yes [provider]  Multiple Vitamins-Minerals (OCUVITE PO) Take 1 tablet by mouth at bedtime.   Yes [provider]  triamcinolone (NASACORT) 55 MCG/ACT AERO nasal inhaler Place 2 sprays into the nose daily as needed (congestion/sinus headache).    Yes [provider]  VITAMIN D PO Take 1,000 Units by mouth daily with breakfast.   Yes [provider]  fluconazole (DIFLUCAN) 150 MG tablet Take 1 tablet after finishing your antibiotic, you can take a second one in 3 days if needed Patient not taking: Reported on 10/25/2022 04/28/22   McElwee, Lauren A, NP  Wheat Dextrin (BENEFIBER DRINK MIX PO) Take 15 mLs by mouth 2 (two) times daily.  Patient not taking: Reported on 10/25/2022    [provider]    Scheduled Meds:  enoxaparin (LOVENOX) injection  40 mg Subcutaneous Q24H   insulin aspart  0-9 Units Subcutaneous TID WC   Infusions:  sodium chloride     ceFEPime (MAXIPIME) IV     metronidazole Stopped (10/25/22 0302)   PRN Meds: HYDROmorphone (DILAUDID)  injection, ondansetron (ZOFRAN) IV   Allergies as of 10/24/2022 - Review Complete 10/24/2022  Allergen Reaction Noted   Biaxin [clarithromycin] Other (See Comments) 09/19/2007   Codeine Other (See Comments) 09/19/2007   Decongestant [pseudoephedrine hcl er] Other (See Comments) 07/22/2018   Phenylephrine Other (See Comments) 07/22/2018   Rosuvastatin Nausea And Vomiting and Other (See Comments) 03/05/2019   Acetaminophen Other (See Comments) 02/23/2015    Family History  Problem Relation Age of Onset   Heart disease Mother        CHF   Diverticulosis Mother    Bone cancer Father        found in his mouth first   Colon cancer Neg Hx    Stomach cancer Neg Hx    Esophageal cancer Neg Hx    Pancreatic cancer Neg Hx    Colon polyps Neg Hx     Social History   Socioeconomic History   Marital status: Widowed    Spouse name: Not on file   Number of children: 2   Years of education: Not on file   Highest education level: Some college, no degree  Occupational History   Occupation: retired    Comment: Press photographer  Tobacco Use   Smoking status: Former    Packs/day: 1.00    Years: 50.00    Total pack years: 50.00    Types: Cigarettes    Quit date: 07/23/2008    Years since quitting: 14.2   Smokeless tobacco: Never  Vaping Use   Vaping Use: Never used  Substance and Sexual Activity   Alcohol use: No   Drug use: No   Sexual activity: Not Currently  Other Topics Concern   Not on file  Social History Narrative            Social Determinants of Health   Financial Resource Strain: Low Risk  (12/05/2021)   Overall Financial Resource Strain (CARDIA)    Difficulty of Paying Living Expenses: Not hard at all  Food Insecurity: No Food Insecurity (12/05/2021)   Hunger Vital Sign    Worried About Running Out of Food in the Last Year: Never  true    Ran Out of Food in the Last Year: Never true  Transportation Needs: No Transportation Needs (12/05/2021)   PRAPARE - Armed forces logistics/support/administrative officer (Medical): No    Lack of Transportation (Non-Medical): No  Physical Activity: Sufficiently Active (12/05/2021)   Exercise Vital Sign    Days of Exercise per Week: 5 days    Minutes of Exercise per Session: 30 min  Stress: No Stress Concern Present (12/05/2021)   [place]    Feeling of Stress : Not at all  Social Connections: Moderately Integrated (12/05/2021)   Social Connection and Isolation Panel [NHANES]    Frequency of Communication with Friends and Family: Once a week    Frequency of Social Gatherings with Friends and Family: More than three times a week    Attends Religious Services: More than 4 times per year    Active Member of [employer] or Organizations: Yes    Attends Archivist Meetings: More than 4 times per year    Marital Status: Widowed  Intimate Partner Violence: Not At Risk (12/05/2021)   Humiliation, Afraid, Rape, and Kick questionnaire    Fear of Current or Ex-Partner: No    Emotionally Abused: No    Physically Abused: No    Sexually Abused: No    REVIEW OF SYSTEMS: Constitutional: No profound weakness or fatigue. ENT:  No nose bleeds Pulm: No shortness of breath or cough CV:  No palpitations, no LE edema.  No angina GU: At home did not have difficulty urinating but no hematuria, no frequency GI: See HPI. Heme: Denies unusual or excessive bleeding or bruising. Transfusions: None Neuro:  No headaches, no peripheral tingling or numbness.  No seizures, no syncope.  No limb weakness. MS: Has great difficulty sitting up in bed and moving about because of her back pain. Derm:  No itching, no rash or sores.  Endocrine:  No sweats or chills.  No polyuria or dysuria Immunization: Reviewed. Travel: Not queried.   PHYSICAL EXAM: Vital signs in last 24 hours: Vitals:   10/25/22 0816 10/25/22 1010  BP:  (!) 116/53  Pulse:  81  Resp:  18  Temp: 98.4 F (36.9 C) 98.2 F (36.8  C)  SpO2:  96%   Wt Readings from Last 3 Encounters:  07/28/22 56.7 kg  04/28/22 58.7 kg  01/26/22 58.9 kg    General: Patient appears stated age.  Has facial rubor consistent with rosacea.  Alert.  Able to provide good but somewhat rambling history. Head: No facial asymmetry or swelling.  No signs of head trauma. Eyes: Conjunctiva pink.  EOMI Ears: No obvious hearing deficit Nose: No congestion or discharge Mouth: Good dentition.  Tongue midline.  Mucosa slightly dry but clear, pink. Neck: No JVD, no masses, no thyromegaly Lungs: Clear bilaterally without labored breathing.  No cough Heart: RRR.  No MRG.  S1, S2 present Abdomen: Soft without distention.  Active bowel sounds.  Tender without guarding in left mid to lower abdomen.  No HSM, hernias, bruits.   Rectal: There is a an inch and a half, slender soft formed, brown/nonbloody stool emerging from the rectum.  Palpation near the rectum and within the rectum causes tenderness.  Hard stool palpable within reach of index finger.  Stool is brown, tests FOBT positive. Musc/Skeltl: No obvious joint redness, swelling or gross deformity. Extremities: No CCE. Neurologic: Anxious.  Oriented x 3.  Moves all 4 limbs.  Limb strength not tested but no gross deficits. Skin: Mall Band-Aid covers a recently biopsied lesion on the upper left forehead. Nodes: No cervical adenopathy Psych: Somewhat anxious.  Periodic rapid speech.  Pleasant, cooperative.  Intake/Output from previous day: 12/26 0701 - 12/27 0700 In: -  Out: 1900 [Urine:1900] Intake/Output this shift: No intake/output data recorded.  LAB RESULTS: Recent Labs    10/24/22 1320  WBC 13.8*  HGB 13.0  HCT 40.4  PLT 273   BMET Lab Results  Component Value Date   NA 140 10/24/2022   NA 140 07/28/2022   NA 139 07/27/2021   K 4.0 10/24/2022   K 4.1 07/28/2022   K 4.3 07/27/2021   CL 106 10/24/2022   CL 103 07/28/2022   CL 104 07/27/2021   CO2 24 10/24/2022   CO2 28  07/28/2022   CO2 25 07/27/2021   GLUCOSE 117 (H) 10/24/2022   GLUCOSE 100 (H) 07/28/2022   GLUCOSE 105 (H) 07/27/2021   BUN 14 10/24/2022   BUN 17 07/28/2022   BUN 17 07/27/2021   CREATININE 0.78 10/24/2022   CREATININE 0.84 07/28/2022   CREATININE 0.63 07/27/2021   CALCIUM 9.3 10/24/2022   CALCIUM 9.7 07/28/2022   CALCIUM 10.1 07/27/2021   LFT No results for input(s): "PROT", "ALBUMIN", "AST", "ALT", "ALKPHOS", "BILITOT", "BILIDIR", "IBILI" in the last 72 hours. PT/INR Lab Results  Component Value Date   INR 1.0 07/14/2008   INR 1.1 07/10/2008   Hepatitis Panel No results for input(s): "HEPBSAG", "HCVAB", "HEPAIGM", "HEPBIGM" in the last 72 hours. C-Diff No components found for: "CDIFF" Lipase     Component Value Date/Time   LIPASE 22 01/30/2020 1621    Drugs of Abuse  No results found for: "LABOPIA", "COCAINSCRNUR", "LABBENZ", "AMPHETMU", "THCU", "LABBARB"   RADIOLOGY STUDIES: CT ABDOMEN PELVIS W CONTRAST  Result Date: 10/24/2022 CLINICAL DATA:  Acute abdominal pain EXAM: CT ABDOMEN AND PELVIS WITH CONTRAST TECHNIQUE: Multidetector CT imaging of the abdomen and pelvis was performed using the standard protocol following bolus administration of intravenous contrast. RADIATION DOSE REDUCTION: This exam was performed according to the departmental dose-optimization program which includes automated exposure control, adjustment of the mA and/or kV according to patient size and/or use of iterative reconstruction technique. CONTRAST:  40m OMNIPAQUE IOHEXOL 350 MG/ML SOLN COMPARISON:  12/14/2017 FINDINGS: Lower chest: Patchy basilar atelectasis is noted. No sizable effusion is seen. Hepatobiliary: Fatty infiltration of the liver is noted. The gallbladder has been surgically removed. Pancreas: Unremarkable. No pancreatic ductal dilatation or surrounding inflammatory changes. Spleen: Normal in size without focal abnormality. Adrenals/Urinary Tract: Adrenal glands are within normal  limits. Kidneys demonstrate prominent extrarenal pelves without obstructive change. No calculi are noted. The bladder is well distended. Stomach/Bowel: Findings suspicious for rectal prolapse are noted. Diverticulosis is seen. There is a large pericolonic collection of fecal material which may represent a large diverticulum or possibly pericolonic abscess. This is larger than that noted in 2019 and currently measures approximately 4.7 cm in greatest dimension. Pericolonic inflammatory change consistent with diverticulitis is noted. The appendix has been surgically removed. Small bowel and stomach are within normal limits. Vascular/Lymphatic: Aortic atherosclerosis. No enlarged abdominal or pelvic lymph nodes. Reproductive: Fluid attenuation is noted within the endometrial cavity of uncertain significance. This is somewhat suspicious given the patient's age. No adnexal mass is noted. Other: No abdominal wall hernia or abnormality. No abdominopelvic ascites. Musculoskeletal: No acute or significant osseous findings. IMPRESSION: Well-defined collection of fecal material adjacent to the colon with surrounding inflammatory  change. This likely represents a large diverticulum given its chronic nature with superimposed diverticulitis on the current exam. Fluid attenuation within the endometrial canal somewhat suspicious given the patient's age. Further workup is necessary. Findings suggestive of rectal prolapse Fatty liver. Electronically Signed   By: Inez Catalina M.D.   On: 10/24/2022 21:56   DG Abdomen 1 View  Result Date: 10/24/2022 CLINICAL DATA:  Constipation EXAM: ABDOMEN - 1 VIEW COMPARISON:  None Available. FINDINGS: Moderate fecal loading in the colon. No bowel obstruction. An oval collection of air in the mid abdomen is likely in the stomach. No other bony or soft tissue abnormalities are identified. IMPRESSION: Moderate fecal loading in the colon. No other abnormalities. Electronically Signed   By: Dorise Bullion III M.D.   On: 10/24/2022 14:54      IMPRESSION:   Background IBS D.  Stopped taking Benefiber 3 weeks ago, taking nightly narcotics for back pain.  Subsequently developed constipation, fecal impaction, rectal pain/spasm.  CT raising concern for diverticulitis but what the inflammatory changes may represent is stercoral colitis rather than diverticulitis.  Day 1 Maxipime, Flagyl.  Had some stool output after enema overnight.  She needs a full stool "washout" as this may help with the rectal pain.  Fluid in endometrial canal, suspicious given patient's age.  Radiologist felt this needs necessary workup.  Hysteroscopy, D&C removal endometrial polyp 2016    PLAN:        See orders for Gatorade/MiraLAX prep not for a colonoscopy but in order to clear constipation/impaction.  Agree with continuing antibiotics for the time being and continuing heart healthy/carb mod diet.    Gyn consult?    Azucena Freed  10/25/2022, 10:27 AM Phone [phone]

## 2022-10-26 DIAGNOSIS — K5792 Diverticulitis of intestine, part unspecified, without perforation or abscess without bleeding: Secondary | ICD-10-CM | POA: Diagnosis not present

## 2022-10-26 LAB — BASIC METABOLIC PANEL
Anion gap: 11 (ref 5–15)
BUN: 11 mg/dL (ref 8–23)
CO2: 21 mmol/L — ABNORMAL LOW (ref 22–32)
Calcium: 8.9 mg/dL (ref 8.9–10.3)
Chloride: 105 mmol/L (ref 98–111)
Creatinine, Ser: 0.72 mg/dL (ref 0.44–1.00)
GFR, Estimated: >60 mL/min (ref >60)
Glucose, Bld: 155 mg/dL — ABNORMAL HIGH (ref 70–99)
Potassium: 4.3 mmol/L (ref 3.5–5.1)
Sodium: 137 mmol/L (ref 135–145)

## 2022-10-26 LAB — GLUCOSE, CAPILLARY
Glucose-Capillary: 122 mg/dL — ABNORMAL HIGH (ref 70–99)
Glucose-Capillary: 124 mg/dL — ABNORMAL HIGH (ref 70–99)
Glucose-Capillary: 115 mg/dL — ABNORMAL HIGH (ref 70–99)
Glucose-Capillary: 134 mg/dL — ABNORMAL HIGH (ref 70–99)

## 2022-10-26 LAB — OCCULT BLOOD X 1 CARD TO LAB, STOOL: Fecal Occult Bld: NEGATIVE

## 2022-10-26 LAB — CBC
HCT: 38.4 % (ref 36.0–46.0)
Hemoglobin: 12.7 g/dL (ref 12.0–15.0)
MCH: 28.9 pg (ref 26.0–34.0)
MCHC: 33.1 g/dL (ref 30.0–36.0)
MCV: 87.5 fL (ref 80.0–100.0)
Platelets: 235 10*3/uL (ref 150–400)
RBC: 4.39 MIL/uL (ref 3.87–5.11)
RDW: 14 % (ref 11.5–15.5)
WBC: 11.7 10*3/uL — ABNORMAL HIGH (ref 4.0–10.5)
nRBC: 0 % (ref 0.0–0.2)

## 2022-10-26 LAB — URINE CULTURE: Culture: NO GROWTH

## 2022-10-26 MED ORDER — GUAIFENESIN-DM 100-10 MG/5ML PO SYRP
5.0000 mL | ORAL_SOLUTION | ORAL | Status: DC | PRN
  Administered 2022-10-27 – 2022-11-02 (×9): 5 mL via ORAL
  Filled 2022-10-26 (×12): qty 5

## 2022-10-26 MED ORDER — IBUPROFEN 400 MG PO TABS
400.0000 mg | ORAL_TABLET | Freq: Four times a day (QID) | ORAL | Status: DC | PRN
  Administered 2022-10-26 – 2022-10-30 (×5): 400 mg via ORAL
  Filled 2022-10-26 (×6): qty 1

## 2022-10-26 NOTE — Progress Notes (Signed)
GI provider at bedside for rectal exam. Stool collected for hemoccult. Specimen delivered to lab via NT.

## 2022-10-26 NOTE — Progress Notes (Signed)
Mobility Specialist - Progress Note   10/26/22 1600  Mobility  Activity Ambulated with assistance in room  Level of Assistance Standby assist, set-up cues, supervision of patient - no hands on  Assistive Device None  Distance Ambulated (ft) 200 ft  Activity Response Tolerated well  $Mobility charge 1 Mobility    Pt received in bed agreeable to mobility. Limited by uncontrolled loose stools, requested to ambulate in room only. Completed multiple laps in room, eventually declined further mobility d/t stool on floor. Left in bed w/ all needs met and call bell within reach.   [place] Specialist Please contact via SecureChat or Rehab office at [phone]

## 2022-10-26 NOTE — Progress Notes (Signed)
[facility] Progress Note  CC: Diverticulitis, rectal pain and constipation  Subjective: She completed the MiraLAX bowel prep overnight and passed numerous loose nonbloody brown stools earlier this morning.  She soiled herself when walking in the hall.  She is having less rectal pressure and her lower abdominal pain has decreased but has not abated.  No nausea or vomiting.  No chest pain or shortness of breath.  She complains of having a headache for the past week.  Objective:  Vital signs in last 24 hours: Temp:  [97.8 F (36.6 C)-99.3 F (37.4 C)] 98.5 F (36.9 C) (12/28 0811) Pulse Rate:  [72-89] 76 (12/28 0811) Resp:  [16-18] 17 (12/28 0811) BP: (120-138)/(57-68) 138/68 (12/28 0811) SpO2:  [95 %-98 %] 98 % (12/28 0811) Last BM Date : 10/25/22 General: 81 year old female in no acute distress. Heart: Regular rate and rhythm, no murmurs. Pulm: Breath sounds clear throughout. Abdomen: Soft, nondistended.  Mild tenderness to the LLQ area without rebound or guarding.  Positive bowel sounds all 4 quadrants. Rectum: Perianal erythema.  No external hemorrhoids.  Mobile formed stool in the rectal vault without a fecal impaction, limited rectal exam as patient exhibited a significant amount of pain during exam.  No obvious blood in the rectum.  RN at the bedside during exam. Extremities:  Without edema. Neurologic:  Alert and  oriented x 4. Grossly normal neurologically. Psych:  Alert and cooperative. Normal mood and affect.  Intake/Output from previous day: 12/27 0701 - 12/28 0700 In: 1234.6 [I.V.:935; IV Piggyback:299.6] Out: 3050 [Urine:3050] Intake/Output this shift: Total I/O In: 220 [P.O.:220] Out: -   Lab Results: Recent Labs    10/24/22 1320 10/25/22 1156 10/26/22 0159  WBC 13.8* 10.7* 11.7*  HGB 13.0 12.4 12.7  HCT 40.4 37.0 38.4  PLT 273 222 235   BMET Recent Labs    10/24/22 1320 10/25/22 1156 10/26/22 0159  NA 140 136 137  K 4.0 3.4* 4.3   CL 106 106 105  CO2 24 23 21*  GLUCOSE 117* 137* 155*  BUN '14 10 11  '$ CREATININE 0.78 0.70 0.72  CALCIUM 9.3 8.6* 8.9   LFT No results for input(s): "PROT", "ALBUMIN", "AST", "ALT", "ALKPHOS", "BILITOT", "BILIDIR", "IBILI" in the last 72 hours. PT/INR No results for input(s): "LABPROT", "INR" in the last 72 hours. Hepatitis Panel No results for input(s): "HEPBSAG", "HCVAB", "HEPAIGM", "HEPBIGM" in the last 72 hours.  CT ABDOMEN PELVIS W CONTRAST  Result Date: 10/24/2022 CLINICAL DATA:  Acute abdominal pain EXAM: CT ABDOMEN AND PELVIS WITH CONTRAST TECHNIQUE: Multidetector CT imaging of the abdomen and pelvis was performed using the standard protocol following bolus administration of intravenous contrast. RADIATION DOSE REDUCTION: This exam was performed according to the departmental dose-optimization program which includes automated exposure control, adjustment of the mA and/or kV according to patient size and/or use of iterative reconstruction technique. CONTRAST:  19m OMNIPAQUE IOHEXOL 350 MG/ML SOLN COMPARISON:  12/14/2017 FINDINGS: Lower chest: Patchy basilar atelectasis is noted. No sizable effusion is seen. Hepatobiliary: Fatty infiltration of the liver is noted. The gallbladder has been surgically removed. Pancreas: Unremarkable. No pancreatic ductal dilatation or surrounding inflammatory changes. Spleen: Normal in size without focal abnormality. Adrenals/Urinary Tract: Adrenal glands are within normal limits. Kidneys demonstrate prominent extrarenal pelves without obstructive change. No calculi are noted. The bladder is well distended. Stomach/Bowel: Findings suspicious for rectal prolapse are noted. Diverticulosis is seen. There is a large pericolonic collection of fecal material which may represent a large diverticulum or  possibly pericolonic abscess. This is larger than that noted in 2019 and currently measures approximately 4.7 cm in greatest dimension. Pericolonic inflammatory change  consistent with diverticulitis is noted. The appendix has been surgically removed. Small bowel and stomach are within normal limits. Vascular/Lymphatic: Aortic atherosclerosis. No enlarged abdominal or pelvic lymph nodes. Reproductive: Fluid attenuation is noted within the endometrial cavity of uncertain significance. This is somewhat suspicious given the patient's age. No adnexal mass is noted. Other: No abdominal wall hernia or abnormality. No abdominopelvic ascites. Musculoskeletal: No acute or significant osseous findings. IMPRESSION: Well-defined collection of fecal material adjacent to the colon with surrounding inflammatory change. This likely represents a large diverticulum given its chronic nature with superimposed diverticulitis on the current exam. Fluid attenuation within the endometrial canal somewhat suspicious given the patient's age. Further workup is necessary. Findings suggestive of rectal prolapse Fatty liver. Electronically Signed   By: Inez Catalina M.D.   On: 10/24/2022 21:56   DG Abdomen 1 View  Result Date: 10/24/2022 CLINICAL DATA:  Constipation EXAM: ABDOMEN - 1 VIEW COMPARISON:  None Available. FINDINGS: Moderate fecal loading in the colon. No bowel obstruction. An oval collection of air in the mid abdomen is likely in the stomach. No other bony or soft tissue abnormalities are identified. IMPRESSION: Moderate fecal loading in the colon. No other abnormalities. Electronically Signed   By: Dorise Bullion III M.D.   On: 10/24/2022 14:54    Assessment / Plan:  44) 81 year old female with a history of IBS-D who was admitted to the hospital 10/24/2022 with constipation and rectal pain after she took Imodium x 2 days. CTAP with contrast showed well-defined collection of fecal material adjacent to the colon with surrounding inflammatory change. This likely represents a large diverticulum given its chronic nature with superimposed diverticulitis on the current exam. Possible stercoral  colitis and rectal prolapse.  On Flagyl and Cefepime. WBC 13.8 -> 11.7. Received Miralax prep overnight which she resulted in passing frequent nonbloody brown loose stools overnight and this morning.  Rectal pressure and LLQ pain have decreased. -No plans for endoscopic evaluation at this time -Consider smog enema if she does not pass solid stool on her own -Pain management per the hospitalist -Hepatic panel, BMP and CBC in am -Diet as tolerated -Continue Flagyl and Cefepime, transition to oral antibiotics at time of discharge -Patient is scheduled for outpatient GI follow-up appointment with Dr. Fuller Plan on1/17/2023  2) CTAP showed fluid attenuation within the endometrial canal somewhat suspicious given the patient's age.  -Consider gyn evaluation as an outpatient   3) Fatty liver per CTAP  4) History of atrial fibrillation.  On ASA and Diltiazem.  5) Headache -Management per the hospitalist  Principal Problem:   Acute diverticulitis Active Problems:   Diabetes mellitus type II, controlled ([place])   Rectal pain   Constipation   Atrial fibrillation, chronic ([place])     LOS: 1 day   Noralyn Pick  10/26/2022, 12:44PM

## 2022-10-26 NOTE — Progress Notes (Signed)
PROGRESS NOTE    Angel Holland  HUD:[id number] DOB: 10-Jul-1941 DOA: 10/24/2022 PCP: Charyl Dancer, NP   Brief Narrative: 81 year old with past medical history significant for IBS-D, diabetes type 2, A-fib presented to the ED complaining of rectal pain.  She has been taking fiber pills for more than 20 years, she usually have diarrhea.  After she discontinue fiber pills she developed constipation.  Patient took some Imodium, to avoid having diarrhea at a  family house.  Last bowel movement 4 days prior to admission.  She developed the day prior to admission severe rectal pain lasted for 6 hours.  Evaluation in the ED she was found to have mild leukocytosis, CT scan showed large diverticulum with superimposed diverticulitis.   Assessment & Plan:   Principal Problem:   Acute diverticulitis Active Problems:   Diabetes mellitus type II, controlled ([place])   Rectal pain   Constipation   Atrial fibrillation, chronic ([facility])   1-Acute Diverticulitis: Continue with cefepime and Flagyl day 2. Report  improvement of abdominal pain  Rectal Pain,  Suspect related to fecal impaction.  Resolved after enema GI order Miralax.  Had multiples BM overnight.   Chronic A fib: On Cardizem and baby aspirin  DM type 2:  Continue with a sliding scale insulin  Hypokalemia; Replaced.   Endometrial fluid attenuation; she will need follow up with GYN. Patient and family aware.  Headaches; ibuprofen, PRN tramadol/   Estimated body mass index is 26.13 kg/m as calculated from the following:   Height as of 01/26/22: '4\' 10"'$  (1.473 m).   Weight as of 07/28/22: 56.7 kg.   DVT prophylaxis: Lovenox Code Status: Full code Family Communication: Care discussed with patient Disposition Plan:  Status is: Observation The patient will require care spanning > 2 midnights and should be moved to inpatient because: Management of acute diverticulitis    Consultants:  GI  Procedures:  none  Antimicrobials:     Subjective: Report headaches since admission. Forehead.  Report multiples BM after drinking miralax.  Denies rectal pain.    Objective: Vitals:   10/25/22 2045 10/26/22 0040 10/26/22 0613 10/26/22 0811  BP: 133/63 120/60 128/67 138/68  Pulse: 89 82 72 76  Resp: '18 18 18 17  '$ Temp: 99.3 F (37.4 C) 98.6 F (37 C) 97.8 F (36.6 C) 98.5 F (36.9 C)  TempSrc: Oral Oral Oral Oral  SpO2: 95% 95% 96% 98%    Intake/Output Summary (Last 24 hours) at 10/26/2022 1335 Last data filed at 10/26/2022 1245 Gross per 24 hour  Intake 1454.58 ml  Output 3800 ml  Net -2345.42 ml    There were no vitals filed for this visit.  Examination:  General exam: NAD Respiratory system: CTA Cardiovascular system: S 1, S 2 RRR Gastrointestinal system: BS present, soft, nt Central nervous system: alert Extremities: no edema   Data Reviewed: I have personally reviewed following labs and imaging studies  CBC: Recent Labs  Lab 10/24/22 1320 10/25/22 1156 10/26/22 0159  WBC 13.8* 10.7* 11.7*  NEUTROABS 11.9*  --   --   HGB 13.0 12.4 12.7  HCT 40.4 37.0 38.4  MCV 90.0 87.1 87.5  PLT 273 222 858    Basic Metabolic Panel: Recent Labs  Lab 10/24/22 1320 10/25/22 1156 10/26/22 0159  NA 140 136 137  K 4.0 3.4* 4.3  CL 106 106 105  CO2 24 23 21*  GLUCOSE 117* 137* 155*  BUN '14 10 11  '$ CREATININE 0.78 0.70 0.72  CALCIUM 9.3  8.6* 8.9    GFR: CrCl cannot be calculated (Unknown ideal weight.). Liver Function Tests: No results for input(s): "AST", "ALT", "ALKPHOS", "BILITOT", "PROT", "ALBUMIN" in the last 168 hours. No results for input(s): "LIPASE", "AMYLASE" in the last 168 hours. No results for input(s): "AMMONIA" in the last 168 hours. Coagulation Profile: No results for input(s): "INR", "PROTIME" in the last 168 hours. Cardiac Enzymes: No results for input(s): "CKTOTAL", "CKMB", "CKMBINDEX", "TROPONINI" in the last 168 hours. BNP (last 3 results) No results for input(s):  "PROBNP" in the last 8760 hours. HbA1C: No results for input(s): "HGBA1C" in the last 72 hours. CBG: Recent Labs  Lab 10/25/22 1209 10/25/22 1657 10/25/22 2047 10/26/22 0813 10/26/22 1201  GLUCAP 125* 124* 145* 122* 124*   Lipid Profile: No results for input(s): "CHOL", "HDL", "LDLCALC", "TRIG", "CHOLHDL", "LDLDIRECT" in the last 72 hours. Thyroid Function Tests: No results for input(s): "TSH", "T4TOTAL", "FREET4", "T3FREE", "THYROIDAB" in the last 72 hours. Anemia Panel: No results for input(s): "VITAMINB12", "FOLATE", "FERRITIN", "TIBC", "IRON", "RETICCTPCT" in the last 72 hours. Sepsis Labs: No results for input(s): "PROCALCITON", "LATICACIDVEN" in the last 168 hours.  Recent Results (from the past 240 hour(s))  Urine Culture     Status: None   Collection Time: 10/25/22 12:55 AM   Specimen: Urine, Catheterized  Result Value Ref Range Status   Specimen Description URINE, CATHETERIZED  Final   Special Requests NONE  Final   Culture   Final    NO GROWTH Performed at [facility], [address], [place], [place] [address]    Report Status 10/26/2022 FINAL  Final         Radiology Studies: CT ABDOMEN PELVIS W CONTRAST  Result Date: 10/24/2022 CLINICAL DATA:  Acute abdominal pain EXAM: CT ABDOMEN AND PELVIS WITH CONTRAST TECHNIQUE: Multidetector CT imaging of the abdomen and pelvis was performed using the standard protocol following bolus administration of intravenous contrast. RADIATION DOSE REDUCTION: This exam was performed according to the departmental dose-optimization program which includes automated exposure control, adjustment of the mA and/or kV according to patient size and/or use of iterative reconstruction technique. CONTRAST:  42m OMNIPAQUE IOHEXOL 350 MG/ML SOLN COMPARISON:  12/14/2017 FINDINGS: Lower chest: Patchy basilar atelectasis is noted. No sizable effusion is seen. Hepatobiliary: Fatty infiltration of the liver is noted. The gallbladder has been  surgically removed. Pancreas: Unremarkable. No pancreatic ductal dilatation or surrounding inflammatory changes. Spleen: Normal in size without focal abnormality. Adrenals/Urinary Tract: Adrenal glands are within normal limits. Kidneys demonstrate prominent extrarenal pelves without obstructive change. No calculi are noted. The bladder is well distended. Stomach/Bowel: Findings suspicious for rectal prolapse are noted. Diverticulosis is seen. There is a large pericolonic collection of fecal material which may represent a large diverticulum or possibly pericolonic abscess. This is larger than that noted in 2019 and currently measures approximately 4.7 cm in greatest dimension. Pericolonic inflammatory change consistent with diverticulitis is noted. The appendix has been surgically removed. Small bowel and stomach are within normal limits. Vascular/Lymphatic: Aortic atherosclerosis. No enlarged abdominal or pelvic lymph nodes. Reproductive: Fluid attenuation is noted within the endometrial cavity of uncertain significance. This is somewhat suspicious given the patient's age. No adnexal mass is noted. Other: No abdominal wall hernia or abnormality. No abdominopelvic ascites. Musculoskeletal: No acute or significant osseous findings. IMPRESSION: Well-defined collection of fecal material adjacent to the colon with surrounding inflammatory change. This likely represents a large diverticulum given its chronic nature with superimposed diverticulitis on the current exam. Fluid attenuation within  the endometrial canal somewhat suspicious given the patient's age. Further workup is necessary. Findings suggestive of rectal prolapse Fatty liver. Electronically Signed   By: Inez Catalina M.D.   On: 10/24/2022 21:56   DG Abdomen 1 View  Result Date: 10/24/2022 CLINICAL DATA:  Constipation EXAM: ABDOMEN - 1 VIEW COMPARISON:  None Available. FINDINGS: Moderate fecal loading in the colon. No bowel obstruction. An oval collection  of air in the mid abdomen is likely in the stomach. No other bony or soft tissue abnormalities are identified. IMPRESSION: Moderate fecal loading in the colon. No other abnormalities. Electronically Signed   By: Dorise Bullion III M.D.   On: 10/24/2022 14:54        Scheduled Meds:  aspirin EC  81 mg Oral See admin instructions   diltiazem  120 mg Oral Daily   enoxaparin (LOVENOX) injection  40 mg Subcutaneous Q24H   insulin aspart  0-9 Units Subcutaneous TID WC   Continuous Infusions:  ceFEPime (MAXIPIME) IV 2 g (10/26/22 1019)   metronidazole 500 mg (10/26/22 1215)     LOS: 1 day    Time spent: 35 minutes    Chessica Audia A Zyia Kaneko, MD [facility]   If 7PM-7AM, please contact night-coverage [email or web address]  10/26/2022, 1:35 PM

## 2022-10-27 DIAGNOSIS — K5792 Diverticulitis of intestine, part unspecified, without perforation or abscess without bleeding: Secondary | ICD-10-CM | POA: Diagnosis not present

## 2022-10-27 LAB — CBC
HCT: 36.6 % (ref 36.0–46.0)
Hemoglobin: 12.2 g/dL (ref 12.0–15.0)
MCH: 28.6 pg (ref 26.0–34.0)
MCHC: 33.3 g/dL (ref 30.0–36.0)
MCV: 85.9 fL (ref 80.0–100.0)
Platelets: 191 10*3/uL (ref 150–400)
RBC: 4.26 MIL/uL (ref 3.87–5.11)
RDW: 13.6 % (ref 11.5–15.5)
WBC: 7.1 10*3/uL (ref 4.0–10.5)
nRBC: 0 % (ref 0.0–0.2)

## 2022-10-27 LAB — BASIC METABOLIC PANEL
Anion gap: 9 (ref 5–15)
BUN: 8 mg/dL (ref 8–23)
CO2: 18 mmol/L — ABNORMAL LOW (ref 22–32)
Calcium: 8.5 mg/dL — ABNORMAL LOW (ref 8.9–10.3)
Chloride: 107 mmol/L (ref 98–111)
Creatinine, Ser: 0.58 mg/dL (ref 0.44–1.00)
GFR, Estimated: >60 mL/min (ref >60)
Glucose, Bld: 111 mg/dL — ABNORMAL HIGH (ref 70–99)
Potassium: 3.6 mmol/L (ref 3.5–5.1)
Sodium: 134 mmol/L — ABNORMAL LOW (ref 135–145)

## 2022-10-27 LAB — GLUCOSE, CAPILLARY
Glucose-Capillary: 122 mg/dL — ABNORMAL HIGH (ref 70–99)
Glucose-Capillary: 161 mg/dL — ABNORMAL HIGH (ref 70–99)
Glucose-Capillary: 104 mg/dL — ABNORMAL HIGH (ref 70–99)
Glucose-Capillary: 110 mg/dL — ABNORMAL HIGH (ref 70–99)

## 2022-10-27 LAB — RESP PANEL BY RT-PCR (RSV, FLU A&B, COVID)  RVPGX2
Influenza A by PCR: NEGATIVE
Influenza B by PCR: NEGATIVE
Resp Syncytial Virus by PCR: NEGATIVE
SARS Coronavirus 2 by RT PCR: POSITIVE — AB

## 2022-10-27 LAB — MAGNESIUM: Magnesium: 2.2 mg/dL (ref 1.7–2.4)

## 2022-10-27 MED ORDER — VITAMIN C 500 MG PO TABS
500.0000 mg | ORAL_TABLET | Freq: Every day | ORAL | Status: DC
  Administered 2022-10-28 – 2022-11-02 (×6): 500 mg via ORAL
  Filled 2022-10-27 (×7): qty 1

## 2022-10-27 MED ORDER — GUAIFENESIN ER 600 MG PO TB12
600.0000 mg | ORAL_TABLET | Freq: Two times a day (BID) | ORAL | Status: DC | PRN
  Administered 2022-10-28 – 2022-10-29 (×2): 600 mg via ORAL
  Filled 2022-10-27 (×2): qty 1

## 2022-10-27 MED ORDER — FLUTICASONE PROPIONATE 50 MCG/ACT NA SUSP
1.0000 | Freq: Every day | NASAL | Status: DC
  Administered 2022-10-27 – 2022-11-02 (×7): 1 via NASAL
  Filled 2022-10-27: qty 16

## 2022-10-27 MED ORDER — NIRMATRELVIR/RITONAVIR (PAXLOVID)TABLET
3.0000 | ORAL_TABLET | Freq: Two times a day (BID) | ORAL | Status: AC
  Administered 2022-10-27 – 2022-11-01 (×9): 3 via ORAL
  Filled 2022-10-27: qty 30

## 2022-10-27 MED ORDER — CHLORHEXIDINE GLUCONATE CLOTH 2 % EX PADS
6.0000 | MEDICATED_PAD | Freq: Every day | CUTANEOUS | Status: DC
  Administered 2022-10-27 – 2022-11-01 (×6): 6 via TOPICAL

## 2022-10-27 MED ORDER — POTASSIUM CHLORIDE CRYS ER 20 MEQ PO TBCR
40.0000 meq | EXTENDED_RELEASE_TABLET | Freq: Once | ORAL | Status: DC
  Filled 2022-10-27: qty 2

## 2022-10-27 MED ORDER — OXYCODONE HCL 5 MG PO TABS
5.0000 mg | ORAL_TABLET | Freq: Four times a day (QID) | ORAL | Status: DC | PRN
  Administered 2022-10-28 – 2022-11-01 (×8): 5 mg via ORAL
  Filled 2022-10-27 (×8): qty 1

## 2022-10-27 MED ORDER — SACCHAROMYCES BOULARDII 250 MG PO CAPS
250.0000 mg | ORAL_CAPSULE | Freq: Two times a day (BID) | ORAL | Status: DC
  Administered 2022-10-27 – 2022-11-02 (×13): 250 mg via ORAL
  Filled 2022-10-27 (×14): qty 1

## 2022-10-27 MED ORDER — SODIUM CHLORIDE 0.9 % IV SOLN: INTRAVENOUS | Status: DC

## 2022-10-27 MED ORDER — LORATADINE 10 MG PO TABS
10.0000 mg | ORAL_TABLET | Freq: Every day | ORAL | Status: DC
  Administered 2022-10-28 – 2022-11-02 (×6): 10 mg via ORAL
  Filled 2022-10-27 (×7): qty 1

## 2022-10-27 NOTE — Care Management Important Message (Signed)
Important Message  Patient Details  Name: Angel Holland MRN: [id number] Date of Birth: [birth year]   Medicare Important Message Given:  Yes     Hannah Beat 10/27/2022, 11:02 AM

## 2022-10-27 NOTE — Progress Notes (Signed)
PROGRESS NOTE    Angel Holland  EVO:[id number] DOB: [birth year] DOA: 10/24/2022 PCP: Charyl Dancer, NP   Brief Narrative: 81 year old with past medical history significant for IBS-D, diabetes type 2, A-fib presented to the ED complaining of rectal pain.  She has been taking fiber pills for more than 20 years, she usually have diarrhea.  After she discontinue fiber pills she developed constipation.  Patient took some Imodium, to avoid having diarrhea at a  family house.  Last bowel movement 4 days prior to admission.  She developed the day prior to admission severe rectal pain lasted for 6 hours.  Evaluation in the ED she was found to have mild leukocytosis, CT scan showed large diverticulum with superimposed diverticulitis.   Assessment & Plan:   Principal Problem:   Acute diverticulitis Active Problems:   Diabetes mellitus type II, controlled ([place])   Rectal pain   Constipation   Atrial fibrillation, chronic ([facility])   1-Acute Diverticulitis: Continue with cefepime and Flagyl day 2. Report  improvement of abdominal pain Having multiples BM.  Start florastore Repeat electrolytes.   Rectal Pain,  Suspect related to fecal impaction.  Resolved after enema GI order Miralax.  Still having multiples BM overnight.   Chronic A fib: On Cardizem and baby aspirin  DM type 2:  Continue with a sliding scale insulin  Hypokalemia; Replaced.   Endometrial fluid attenuation; she will need follow up with GYN. Patient and family aware.  Headaches; ibuprofen, PRN tramadol/  URI;  Check for covid, flu Start Flonase. Claritin.   Estimated body mass index is 26.13 kg/m as calculated from the following:   Height as of 01/26/22: '4\' 10"'$  (1.473 m).   Weight as of 07/28/22: 56.7 kg.   DVT prophylaxis: Lovenox Code Status: Full code Family Communication: Care discussed with patient Disposition Plan:  Status is: Observation The patient will require care spanning > 2 midnights and should be  moved to inpatient because: Management of acute diverticulitis    Consultants:  GI  Procedures:  none  Antimicrobials:    Subjective: Feels congested, having runny nose, congestion. Still with headaches.  Still having multiples episode of diarrhea, it hasn't quit.     Objective: Vitals:   10/26/22 2025 10/27/22 0408 10/27/22 0756 10/27/22 1134  BP: 128/64 121/65 112/60   Pulse: 69 67 72 78  Resp: 16  17   Temp: 98.1 F (36.7 C) 98.6 F (37 C) 98.4 F (36.9 C)   TempSrc:   Oral   SpO2: 95% 97% 96% 99%    Intake/Output Summary (Last 24 hours) at 10/27/2022 1458 Last data filed at 10/27/2022 0410 Gross per 24 hour  Intake --  Output 1100 ml  Net -1100 ml    There were no vitals filed for this visit.  Examination:  General exam: NAD Respiratory system: CTA Cardiovascular system: S 1, S 2 RRR Gastrointestinal system: BS present, soft, nt Central nervous system: Alert, follows command Extremities: No edema   Data Reviewed: I have personally reviewed following labs and imaging studies  CBC: Recent Labs  Lab 10/24/22 1320 10/25/22 1156 10/26/22 0159 10/27/22 1128  WBC 13.8* 10.7* 11.7* 7.1  NEUTROABS 11.9*  --   --   --   HGB 13.0 12.4 12.7 12.2  HCT 40.4 37.0 38.4 36.6  MCV 90.0 87.1 87.5 85.9  PLT 273 222 235 299    Basic Metabolic Panel: Recent Labs  Lab 10/24/22 1320 10/25/22 1156 10/26/22 0159 10/27/22 1128  NA 140 136  137 134*  K 4.0 3.4* 4.3 3.6  CL 106 106 105 107  CO2 24 23 21* 18*  GLUCOSE 117* 137* 155* 111*  BUN '14 10 11 8  '$ CREATININE 0.78 0.70 0.72 0.58  CALCIUM 9.3 8.6* 8.9 8.5*  MG  --   --   --  2.2    GFR: CrCl cannot be calculated (Unknown ideal weight.). Liver Function Tests: No results for input(s): "AST", "ALT", "ALKPHOS", "BILITOT", "PROT", "ALBUMIN" in the last 168 hours. No results for input(s): "LIPASE", "AMYLASE" in the last 168 hours. No results for input(s): "AMMONIA" in the last 168 hours. Coagulation  Profile: No results for input(s): "INR", "PROTIME" in the last 168 hours. Cardiac Enzymes: No results for input(s): "CKTOTAL", "CKMB", "CKMBINDEX", "TROPONINI" in the last 168 hours. BNP (last 3 results) No results for input(s): "PROBNP" in the last 8760 hours. HbA1C: No results for input(s): "HGBA1C" in the last 72 hours. CBG: Recent Labs  Lab 10/26/22 1201 10/26/22 1630 10/26/22 2023 10/27/22 0815 10/27/22 1222  GLUCAP 124* 115* 134* 122* 161*    Lipid Profile: No results for input(s): "CHOL", "HDL", "LDLCALC", "TRIG", "CHOLHDL", "LDLDIRECT" in the last 72 hours. Thyroid Function Tests: No results for input(s): "TSH", "T4TOTAL", "FREET4", "T3FREE", "THYROIDAB" in the last 72 hours. Anemia Panel: No results for input(s): "VITAMINB12", "FOLATE", "FERRITIN", "TIBC", "IRON", "RETICCTPCT" in the last 72 hours. Sepsis Labs: No results for input(s): "PROCALCITON", "LATICACIDVEN" in the last 168 hours.  Recent Results (from the past 240 hour(s))  Urine Culture     Status: None   Collection Time: 10/25/22 12:55 AM   Specimen: Urine, Catheterized  Result Value Ref Range Status   Specimen Description URINE, CATHETERIZED  Final   Special Requests NONE  Final   Culture   Final    NO GROWTH Performed at [facility], [address], [place], [place] [address]    Report Status 10/26/2022 FINAL  Final         Radiology Studies: No results found.      Scheduled Meds:  aspirin EC  81 mg Oral See admin instructions   Chlorhexidine Gluconate Cloth  6 each Topical Daily   diltiazem  120 mg Oral Daily   enoxaparin (LOVENOX) injection  40 mg Subcutaneous Q24H   fluticasone  1 spray Each Nare Daily   insulin aspart  0-9 Units Subcutaneous TID WC   saccharomyces boulardii  250 mg Oral BID   Continuous Infusions:  ceFEPime (MAXIPIME) IV 2 g (10/27/22 1040)   metronidazole 500 mg (10/27/22 0010)     LOS: 2 days    Time spent: 35 minutes    Kemaya Dorner A Killian Ress,  MD [facility]   If 7PM-7AM, please contact night-coverage [email or web address]  10/27/2022, 2:58 PM

## 2022-10-28 DIAGNOSIS — K5792 Diverticulitis of intestine, part unspecified, without perforation or abscess without bleeding: Secondary | ICD-10-CM | POA: Diagnosis not present

## 2022-10-28 LAB — BASIC METABOLIC PANEL
Anion gap: 8 (ref 5–15)
BUN: 7 mg/dL — ABNORMAL LOW (ref 8–23)
CO2: 19 mmol/L — ABNORMAL LOW (ref 22–32)
Calcium: 8.4 mg/dL — ABNORMAL LOW (ref 8.9–10.3)
Chloride: 111 mmol/L (ref 98–111)
Creatinine, Ser: 0.48 mg/dL (ref 0.44–1.00)
GFR, Estimated: >60 mL/min (ref >60)
Glucose, Bld: 117 mg/dL — ABNORMAL HIGH (ref 70–99)
Potassium: 3.6 mmol/L (ref 3.5–5.1)
Sodium: 138 mmol/L (ref 135–145)

## 2022-10-28 LAB — GLUCOSE, CAPILLARY
Glucose-Capillary: 157 mg/dL — ABNORMAL HIGH (ref 70–99)
Glucose-Capillary: 120 mg/dL — ABNORMAL HIGH (ref 70–99)
Glucose-Capillary: 105 mg/dL — ABNORMAL HIGH (ref 70–99)
Glucose-Capillary: 128 mg/dL — ABNORMAL HIGH (ref 70–99)

## 2022-10-28 LAB — MAGNESIUM: Magnesium: 2.3 mg/dL (ref 1.7–2.4)

## 2022-10-28 LAB — CBC
HCT: 37.5 % (ref 36.0–46.0)
Hemoglobin: 12.3 g/dL (ref 12.0–15.0)
MCH: 28.5 pg (ref 26.0–34.0)
MCHC: 32.8 g/dL (ref 30.0–36.0)
MCV: 87 fL (ref 80.0–100.0)
Platelets: 224 10*3/uL (ref 150–400)
RBC: 4.31 MIL/uL (ref 3.87–5.11)
RDW: 13.7 % (ref 11.5–15.5)
WBC: 5.7 10*3/uL (ref 4.0–10.5)
nRBC: 0 % (ref 0.0–0.2)

## 2022-10-28 LAB — C-REACTIVE PROTEIN: CRP: 2.2 mg/dL — ABNORMAL HIGH (ref <1.0)

## 2022-10-28 MED ORDER — SALINE SPRAY 0.65 % NA SOLN: 1.0000 | NASAL | Status: DC | PRN

## 2022-10-28 NOTE — Progress Notes (Signed)
PROGRESS NOTE    Angel Holland  BJS:[id number] DOB: June 28, 1941 DOA: 10/24/2022 PCP: Charyl Dancer, NP   Brief Narrative: 81 year old with past medical history significant for IBS-D, diabetes type 2, A-fib presented to the ED complaining of rectal pain.  Angel Holland has been taking fiber pills for more than 20 years, Angel Holland usually have diarrhea.  After Angel Holland discontinue fiber pills Angel Holland developed constipation.  Patient took some Imodium, to avoid having diarrhea at a  family house.  Last bowel movement 4 days prior to admission.  Angel Holland developed the day prior to admission severe rectal pain lasted for 6 hours.  Evaluation in the ED Angel Holland was found to have mild leukocytosis, CT scan showed large diverticulum with superimposed diverticulitis.   Assessment & Plan:   Principal Problem:   Acute diverticulitis Active Problems:   Diabetes mellitus type II, controlled ([place])   Rectal pain   Constipation   Atrial fibrillation, chronic ([facility])   1-Acute Diverticulitis: Continue with cefepime and Flagyl day 3. Report  improvement of abdominal pain Having multiples BM.  Started  florastore Worsening diarrhea could be related to covid too.   Rectal Pain,  Suspect related to fecal impaction.  Resolved after enema GI order Miralax.  Resolved.   Chronic A fib: On Cardizem and baby aspirin.  DM type 2:  Continue with a sliding scale insulin  Hypokalemia; Replaced.   Endometrial fluid attenuation; Angel Holland will need follow up with GYN. Patient and family aware.  Headaches; ibuprofen, PRN tramadol/  URI; Covid viral illness.  Covid Positive.  Started Paxlovid.  Started Flonase. Claritin.   Estimated body mass index is 26.13 kg/m as calculated from the following:   Height as of this encounter: '4\' 10"'$  (1.473 m).   Weight as of this encounter: 56.7 kg.   DVT prophylaxis: Lovenox Code Status: Full code Family Communication: Care discussed with patient Disposition Plan:  Status is: Observation The  patient will require care spanning > 2 midnights and should be moved to inpatient because: Management of acute diverticulitis    Consultants:  GI  Procedures:  none  Antimicrobials:    Subjective: Angel Holland report diarrhea persist but less frequent. Report headaches. Nose congestions.    Objective: Vitals:   10/27/22 2056 10/28/22 0649 10/28/22 1121 10/28/22 1400  BP: 126/64 118/77 136/65   Pulse: 80 73 67   Resp: '18 18 18   '$ Temp: 99.1 F (37.3 C) 98.8 F (37.1 C) 98.7 F (37.1 C)   TempSrc: Oral Oral Oral   SpO2: 95% 96% 94%   Weight:    56.7 kg  Height:    '4\' 10"'$  (1.473 m)    Intake/Output Summary (Last 24 hours) at 10/28/2022 1445 Last data filed at 10/28/2022 6073 Gross per 24 hour  Intake 1508.57 ml  Output 1100 ml  Net 408.57 ml    Filed Weights   10/28/22 1400  Weight: 56.7 kg    Examination:  General exam: NAD Respiratory system: CTA Cardiovascular system: S 1, S 2 RRR Gastrointestinal system: BS present, soft. , nt Central nervous system: alert Extremities: No edema   Data Reviewed: I have personally reviewed following labs and imaging studies  CBC: Recent Labs  Lab 10/24/22 1320 10/25/22 1156 10/26/22 0159 10/27/22 1128 10/28/22 0831  WBC 13.8* 10.7* 11.7* 7.1 5.7  NEUTROABS 11.9*  --   --   --   --   HGB 13.0 12.4 12.7 12.2 12.3  HCT 40.4 37.0 38.4 36.6 37.5  MCV 90.0 87.1 87.5  85.9 87.0  PLT 273 222 235 191 865    Basic Metabolic Panel: Recent Labs  Lab 10/24/22 1320 10/25/22 1156 10/26/22 0159 10/27/22 1128 10/28/22 0831  NA 140 136 137 134* 138  K 4.0 3.4* 4.3 3.6 3.6  CL 106 106 105 107 111  CO2 24 23 21* 18* 19*  GLUCOSE 117* 137* 155* 111* 117*  BUN '14 10 11 8 '$ 7*  CREATININE 0.78 0.70 0.72 0.58 0.48  CALCIUM 9.3 8.6* 8.9 8.5* 8.4*  MG  --   --   --  2.2 2.3    GFR: Estimated Creatinine Clearance: 40.4 mL/min (by C-G formula based on SCr of 0.48 mg/dL). Liver Function Tests: No results for input(s): "AST",  "ALT", "ALKPHOS", "BILITOT", "PROT", "ALBUMIN" in the last 168 hours. No results for input(s): "LIPASE", "AMYLASE" in the last 168 hours. No results for input(s): "AMMONIA" in the last 168 hours. Coagulation Profile: No results for input(s): "INR", "PROTIME" in the last 168 hours. Cardiac Enzymes: No results for input(s): "CKTOTAL", "CKMB", "CKMBINDEX", "TROPONINI" in the last 168 hours. BNP (last 3 results) No results for input(s): "PROBNP" in the last 8760 hours. HbA1C: No results for input(s): "HGBA1C" in the last 72 hours. CBG: Recent Labs  Lab 10/27/22 1222 10/27/22 1724 10/27/22 2224 10/28/22 0856 10/28/22 1125  GLUCAP 161* 104* 110* 157* 120*    Lipid Profile: No results for input(s): "CHOL", "HDL", "LDLCALC", "TRIG", "CHOLHDL", "LDLDIRECT" in the last 72 hours. Thyroid Function Tests: No results for input(s): "TSH", "T4TOTAL", "FREET4", "T3FREE", "THYROIDAB" in the last 72 hours. Anemia Panel: No results for input(s): "VITAMINB12", "FOLATE", "FERRITIN", "TIBC", "IRON", "RETICCTPCT" in the last 72 hours. Sepsis Labs: No results for input(s): "PROCALCITON", "LATICACIDVEN" in the last 168 hours.  Recent Results (from the past 240 hour(s))  Urine Culture     Status: None   Collection Time: 10/25/22 12:55 AM   Specimen: Urine, Catheterized  Result Value Ref Range Status   Specimen Description URINE, CATHETERIZED  Final   Special Requests NONE  Final   Culture   Final    NO GROWTH Performed at [facility], [address], [place], [place] [address]    Report Status 10/26/2022 FINAL  Final  Resp panel by RT-PCR (RSV, Flu A&B, Covid) Anterior Nasal Swab     Status: Abnormal   Collection Time: 10/27/22 11:08 AM   Specimen: Anterior Nasal Swab  Result Value Ref Range Status   SARS Coronavirus 2 by RT PCR POSITIVE (A) NEGATIVE Final    Comment: (NOTE) SARS-CoV-2 target nucleic acids are DETECTED.  The SARS-CoV-2 RNA is generally detectable in upper  respiratory specimens during the acute phase of infection. Positive results are indicative of the presence of the identified virus, but do not rule out bacterial infection or co-infection with other pathogens not detected by the test. Clinical correlation with patient history and other diagnostic information is necessary to determine patient infection status. The expected result is Negative.  Fact Sheet for Patients: EntrepreneurPulse.com.au  Fact Sheet for Healthcare Providers: IncredibleEmployment.be  This test is not yet approved or cleared by the [place] FDA and  has been authorized for detection and/or diagnosis of SARS-CoV-2 by FDA under an Emergency Use Authorization (EUA).  This EUA will remain in effect (meaning this test can be used) for the duration of  the COVID-19 declaration under Section 564(b)(1) of the A ct, 21 U.S.C. section 360bbb-3(b)(1), unless the authorization is terminated or revoked sooner.     Influenza A  by PCR NEGATIVE NEGATIVE Final   Influenza B by PCR NEGATIVE NEGATIVE Final    Comment: (NOTE) The Xpert Xpress SARS-CoV-2/FLU/RSV plus assay is intended as an aid in the diagnosis of influenza from Nasopharyngeal swab specimens and should not be used as a sole basis for treatment. Nasal washings and aspirates are unacceptable for Xpert Xpress SARS-CoV-2/FLU/RSV testing.  Fact Sheet for Patients: EntrepreneurPulse.com.au  Fact Sheet for Healthcare Providers: IncredibleEmployment.be  This test is not yet approved or cleared by the [place] FDA and has been authorized for detection and/or diagnosis of SARS-CoV-2 by FDA under an Emergency Use Authorization (EUA). This EUA will remain in effect (meaning this test can be used) for the duration of the COVID-19 declaration under Section 564(b)(1) of the Act, 21 U.S.C. section 360bbb-3(b)(1), unless the authorization is  terminated or revoked.     Resp Syncytial Virus by PCR NEGATIVE NEGATIVE Final    Comment: (NOTE) Fact Sheet for Patients: EntrepreneurPulse.com.au  Fact Sheet for Healthcare Providers: IncredibleEmployment.be  This test is not yet approved or cleared by the [place] FDA and has been authorized for detection and/or diagnosis of SARS-CoV-2 by FDA under an Emergency Use Authorization (EUA). This EUA will remain in effect (meaning this test can be used) for the duration of the COVID-19 declaration under Section 564(b)(1) of the Act, 21 U.S.C. section 360bbb-3(b)(1), unless the authorization is terminated or revoked.  Performed at [facility], [address], [place], [place] [address]          Radiology Studies: No results found.      Scheduled Meds:  ascorbic acid  500 mg Oral Daily   aspirin EC  81 mg Oral See admin instructions   Chlorhexidine Gluconate Cloth  6 each Topical Daily   diltiazem  120 mg Oral Daily   enoxaparin (LOVENOX) injection  40 mg Subcutaneous Q24H   fluticasone  1 spray Each Nare Daily   insulin aspart  0-9 Units Subcutaneous TID WC   loratadine  10 mg Oral Daily   nirmatrelvir/ritonavir  3 tablet Oral BID   potassium chloride  40 mEq Oral Once   saccharomyces boulardii  250 mg Oral BID   Continuous Infusions:  sodium chloride 75 mL/hr at 10/28/22 0658   ceFEPime (MAXIPIME) IV 2 g (10/28/22 0900)   metronidazole Stopped (10/28/22 0126)     LOS: 3 days    Time spent: 35 minutes    Kester Stimpson A Beckett Hickmon, MD [facility]   If 7PM-7AM, please contact night-coverage [email or web address]  10/28/2022, 2:45 PM

## 2022-10-28 NOTE — Evaluation (Signed)
Physical Therapy Evaluation Patient Details Name: Angel Holland MRN: [id number] DOB: [birth year] Today's Date: 10/28/2022  History of Present Illness  81 yo female [facility] with rectal and abdominal pain. CT abd shows large diverticulum with superimposed diverticulitis. PMH includes IBS-D, diabetes type 2, A-fib.  Clinical Impression   Pt presents with increased time and effort to mobilize, decreased activity tolerance vs baseline. Pt to benefit from acute PT to address deficits. Pt ambulated good hallway distance (brief donned given stool incontinence) without AD, reporting fatigue and min dyspnea but recovers well with rest. PT to progress mobility as tolerated, and will continue to follow acutely.         Recommendations for follow up therapy are one component of a multi-disciplinary discharge planning process, led by the attending physician.  Recommendations may be updated based on patient status, additional functional criteria and insurance authorization.  Follow Up Recommendations No PT follow up      Assistance Recommended at Discharge Set up Supervision/Assistance  Patient can return home with the following  A little help with walking and/or transfers;A little help with bathing/dressing/bathroom    Equipment Recommendations None recommended by PT  Recommendations for Other Services       Functional Status Assessment Patient has had a recent decline in their functional status and demonstrates the ability to make significant improvements in function in a reasonable and predictable amount of time.     Precautions / Restrictions Precautions Precautions: Fall Restrictions Weight Bearing Restrictions: No      Mobility  Bed Mobility Overal bed mobility: Needs Assistance Bed Mobility: Supine to Sit, Sit to Supine     Supine to sit: Supervision Sit to supine: Supervision        Transfers Overall transfer level: Needs assistance Equipment used:  None Transfers: Sit to/from Stand Sit to Stand: Supervision           General transfer comment: for safety    Ambulation/Gait Ambulation/Gait assistance: Supervision Gait Distance (Feet): 650 Feet Assistive device: None Gait Pattern/deviations: Step-through pattern, Decreased stride length Gait velocity: decr     General Gait Details: initially slowed, improving speed with continued gait  Stairs            Wheelchair Mobility    Modified Rankin (Stroke Patients Only)       Balance Overall balance assessment: Mild deficits observed, not formally tested                                           Pertinent Vitals/Pain Pain Assessment Pain Assessment: Faces Faces Pain Scale: Hurts little more Pain Location: buttocks Pain Descriptors / Indicators: Sore, Discomfort Pain Intervention(s): Limited activity within patient's tolerance, Monitored during session, Repositioned    Home Living Family/patient expects to be discharged to:: Private residence Living Arrangements: Alone Available Help at Discharge: Available PRN/intermittently Type of Home: House Home Access: Stairs to enter   [employer] of Steps: a couple   Home Layout: One level Home Equipment: None      Prior Function Prior Level of Function : Independent/Modified Independent             Mobility Comments: pt reports driving self and others to church, usually helps others pick up groceries etc       Hand Dominance   Dominant Hand: Right    Extremity/Trunk Assessment   Upper Extremity  Assessment Upper Extremity Assessment: Defer to OT evaluation    Lower Extremity Assessment Lower Extremity Assessment: Overall WFL for tasks assessed    Cervical / Trunk Assessment Cervical / Trunk Assessment: Normal  Communication   Communication: No difficulties  Cognition Arousal/Alertness: Awake/alert Behavior During Therapy: WFL for tasks  assessed/performed Overall Cognitive Status: Within Functional Limits for tasks assessed                                          General Comments      Exercises     Assessment/Plan    PT Assessment Patient needs continued PT services  PT Problem List Decreased mobility;Decreased activity tolerance;Decreased balance;Decreased knowledge of use of DME;Pain       PT Treatment Interventions DME instruction;Therapeutic activities;Gait training;Therapeutic exercise;Patient/family education;Balance training;Stair training;Functional mobility training;Neuromuscular re-education    PT Goals (Current goals can be found in the Care Plan section)  Acute Rehab PT Goals PT Goal Formulation: With patient Time For Goal Achievement: 11/11/22 Potential to Achieve Goals: Good    Frequency Min 3X/week     Co-evaluation               AM-PAC PT "6 Clicks" Mobility  Outcome Measure Help needed turning from your back to your side while in a flat bed without using bedrails?: None Help needed moving from lying on your back to sitting on the side of a flat bed without using bedrails?: None Help needed moving to and from a bed to a chair (including a wheelchair)?: A Little Help needed standing up from a chair using your arms (e.g., wheelchair or bedside chair)?: A Little Help needed to walk in hospital room?: A Little Help needed climbing 3-5 steps with a railing? : A Little 6 Click Score: 20    End of Session   Activity Tolerance: Patient tolerated treatment well Patient left: in bed;with call bell/phone within reach;with bed alarm set Nurse Communication: Mobility status PT Visit Diagnosis: Other abnormalities of gait and mobility (R26.89)    Time: 1400-1436 PT Time Calculation (min) (ACUTE ONLY): 36 min   Charges:   PT Evaluation $PT Eval Low Complexity: 1 Low PT Treatments $Therapeutic Activity: 8-22 mins       Stacie Glaze, PT DPT Acute Rehabilitation  Services Pager [phone]  Office [phone]   Roxine Caddy E Ruffin Pyo 10/28/2022, 4:08 PM

## 2022-10-29 DIAGNOSIS — K5792 Diverticulitis of intestine, part unspecified, without perforation or abscess without bleeding: Secondary | ICD-10-CM | POA: Diagnosis not present

## 2022-10-29 LAB — GLUCOSE, CAPILLARY
Glucose-Capillary: 162 mg/dL — ABNORMAL HIGH (ref 70–99)
Glucose-Capillary: 114 mg/dL — ABNORMAL HIGH (ref 70–99)
Glucose-Capillary: 110 mg/dL — ABNORMAL HIGH (ref 70–99)
Glucose-Capillary: 96 mg/dL (ref 70–99)

## 2022-10-29 LAB — CBC
HCT: 36.2 % (ref 36.0–46.0)
Hemoglobin: 11.7 g/dL — ABNORMAL LOW (ref 12.0–15.0)
MCH: 28.3 pg (ref 26.0–34.0)
MCHC: 32.3 g/dL (ref 30.0–36.0)
MCV: 87.4 fL (ref 80.0–100.0)
Platelets: 232 10*3/uL (ref 150–400)
RBC: 4.14 MIL/uL (ref 3.87–5.11)
RDW: 13.9 % (ref 11.5–15.5)
WBC: 4.6 10*3/uL (ref 4.0–10.5)
nRBC: 0 % (ref 0.0–0.2)

## 2022-10-29 LAB — BASIC METABOLIC PANEL
Anion gap: 10 (ref 5–15)
BUN: 8 mg/dL (ref 8–23)
CO2: 22 mmol/L (ref 22–32)
Calcium: 8.4 mg/dL — ABNORMAL LOW (ref 8.9–10.3)
Chloride: 107 mmol/L (ref 98–111)
Creatinine, Ser: 0.75 mg/dL (ref 0.44–1.00)
GFR, Estimated: >60 mL/min (ref >60)
Glucose, Bld: 184 mg/dL — ABNORMAL HIGH (ref 70–99)
Potassium: 2.8 mmol/L — ABNORMAL LOW (ref 3.5–5.1)
Sodium: 139 mmol/L (ref 135–145)

## 2022-10-29 LAB — MAGNESIUM: Magnesium: 2.2 mg/dL (ref 1.7–2.4)

## 2022-10-29 LAB — C-REACTIVE PROTEIN: CRP: 1 mg/dL — ABNORMAL HIGH (ref <1.0)

## 2022-10-29 MED ORDER — POTASSIUM CHLORIDE CRYS ER 20 MEQ PO TBCR
40.0000 meq | EXTENDED_RELEASE_TABLET | Freq: Once | ORAL | Status: AC
  Administered 2022-10-29: 40 meq via ORAL
  Filled 2022-10-29: qty 2

## 2022-10-29 MED ORDER — KCL-LACTATED RINGERS 20 MEQ/L IV SOLN
INTRAVENOUS | Status: DC
  Filled 2022-10-29 (×3): qty 1000

## 2022-10-29 MED ORDER — POTASSIUM CHLORIDE 2 MEQ/ML IV SOLN
INTRAVENOUS | Status: DC
  Filled 2022-10-29 (×5): qty 1000

## 2022-10-29 MED ORDER — POTASSIUM CHLORIDE 10 MEQ/100ML IV SOLN
10.0000 meq | INTRAVENOUS | Status: AC
  Administered 2022-10-29 (×3): 10 meq via INTRAVENOUS
  Filled 2022-10-29 (×2): qty 100

## 2022-10-29 MED ORDER — POTASSIUM CHLORIDE CRYS ER 20 MEQ PO TBCR
40.0000 meq | EXTENDED_RELEASE_TABLET | ORAL | Status: AC
  Administered 2022-10-29 (×2): 40 meq via ORAL
  Filled 2022-10-29 (×2): qty 2

## 2022-10-29 NOTE — Progress Notes (Signed)
PROGRESS NOTE    Angel [place]  ZWC:[id number] DOB: [birth year] DOA: 10/24/2022 PCP: Charyl Dancer, NP   Brief Narrative: 81 year old with past medical history significant for IBS-D, diabetes type 2, A-fib presented to the ED complaining of rectal pain.  She has been taking fiber pills for more than 20 years, she usually have diarrhea.  After she discontinue fiber pills she developed constipation.  Patient took some Imodium, to avoid having diarrhea at a  family house.  Last bowel movement 4 days prior to admission.  She developed the day prior to admission severe rectal pain lasted for 6 hours.  Evaluation in the ED she was found to have mild leukocytosis, CT scan showed large diverticulum with superimposed diverticulitis.   Assessment & Plan:   Principal Problem:   Acute diverticulitis Active Problems:   Diabetes mellitus type II, controlled ([place])   Rectal pain   Constipation   Atrial fibrillation, chronic (HCC)   1-Acute Diverticulitis: Continue with cefepime and Flagyl day 3. Report  improvement of abdominal pain Started  florastore Worsening diarrhea could be related to covid Still having diarrhea. Continue with IV fluids, support care.   Rectal Pain,  Suspect related to fecal impaction.  Resolved after enema GI order Miralax.  Resolved.   Chronic A fib: On Cardizem and baby aspirin.  DM type 2:  Continue with a sliding scale insulin  Hypokalemia; Replaced oral , add KCL to fluids.   Endometrial fluid attenuation; she will need follow up with GYN. Patient and family aware.  Headaches; ibuprofen, PRN tramadol/  URI; Covid viral illness.  Covid Positive.  Started Paxlovid.  Started Flonase. Claritin.  Stable.   Estimated body mass index is 26.13 kg/m as calculated from the following:   Height as of this encounter: '4\' 10"'$  (1.473 m).   Weight as of this encounter: 56.7 kg.   DVT prophylaxis: Lovenox Code Status: Full code Family Communication: Care  discussed with patient Disposition Plan:  Status is: Observation The patient will require care spanning > 2 midnights and should be moved to inpatient because: still with diarrhea, requiring IV fluids.     Consultants:  GI  Procedures:  none  Antimicrobials:    Subjective: Diarrhea return, having multiples episodes today. Denies abdominal pain.   Objective: Vitals:   10/28/22 1400 10/28/22 2113 10/29/22 0640 10/29/22 0812  BP:  129/66 127/65 126/66  Pulse:  72 60 71  Resp:  '20 19 16  '$ Temp:  98.4 F (36.9 C) 99 F (37.2 C) 98.2 F (36.8 C)  TempSrc:  Oral Oral Oral  SpO2:  98% 94% 100%  Weight: 56.7 kg     Height: '4\' 10"'$  (1.473 m)       Intake/Output Summary (Last 24 hours) at 10/29/2022 1221 Last data filed at 10/29/2022 2353 Gross per 24 hour  Intake 480 ml  Output 1350 ml  Net -870 ml    Filed Weights   10/28/22 1400  Weight: 56.7 kg    Examination:  General exam: NAD Respiratory system: CTA Cardiovascular system: S 1, S 2 RRR Gastrointestinal system: BS present, soft, nt Central nervous system: Alert Extremities: No edema   Data Reviewed: I have personally reviewed following labs and imaging studies  CBC: Recent Labs  Lab 10/24/22 1320 10/25/22 1156 10/26/22 0159 10/27/22 1128 10/28/22 0831 10/29/22 0206  WBC 13.8* 10.7* 11.7* 7.1 5.7 4.6  NEUTROABS 11.9*  --   --   --   --   --   HGB  13.0 12.4 12.7 12.2 12.3 11.7*  HCT 40.4 37.0 38.4 36.6 37.5 36.2  MCV 90.0 87.1 87.5 85.9 87.0 87.4  PLT 273 222 235 191 224 754    Basic Metabolic Panel: Recent Labs  Lab 10/25/22 1156 10/26/22 0159 10/27/22 1128 10/28/22 0831 10/29/22 0206  NA 136 137 134* 138 139  K 3.4* 4.3 3.6 3.6 2.8*  CL 106 105 107 111 107  CO2 23 21* 18* 19* 22  GLUCOSE 137* 155* 111* 117* 184*  BUN '10 11 8 '$ 7* 8  CREATININE 0.70 0.72 0.58 0.48 0.75  CALCIUM 8.6* 8.9 8.5* 8.4* 8.4*  MG  --   --  2.2 2.3 2.2    GFR: Estimated Creatinine Clearance: 40.4 mL/min (by  C-G formula based on SCr of 0.75 mg/dL). Liver Function Tests: No results for input(s): "AST", "ALT", "ALKPHOS", "BILITOT", "PROT", "ALBUMIN" in the last 168 hours. No results for input(s): "LIPASE", "AMYLASE" in the last 168 hours. No results for input(s): "AMMONIA" in the last 168 hours. Coagulation Profile: No results for input(s): "INR", "PROTIME" in the last 168 hours. Cardiac Enzymes: No results for input(s): "CKTOTAL", "CKMB", "CKMBINDEX", "TROPONINI" in the last 168 hours. BNP (last 3 results) No results for input(s): "PROBNP" in the last 8760 hours. HbA1C: No results for input(s): "HGBA1C" in the last 72 hours. CBG: Recent Labs  Lab 10/28/22 1125 10/28/22 1546 10/28/22 2135 10/29/22 0838 10/29/22 1127  GLUCAP 120* 105* 128* 162* 114*    Lipid Profile: No results for input(s): "CHOL", "HDL", "LDLCALC", "TRIG", "CHOLHDL", "LDLDIRECT" in the last 72 hours. Thyroid Function Tests: No results for input(s): "TSH", "T4TOTAL", "FREET4", "T3FREE", "THYROIDAB" in the last 72 hours. Anemia Panel: No results for input(s): "VITAMINB12", "FOLATE", "FERRITIN", "TIBC", "IRON", "RETICCTPCT" in the last 72 hours. Sepsis Labs: No results for input(s): "PROCALCITON", "LATICACIDVEN" in the last 168 hours.  Recent Results (from the past 240 hour(s))  Urine Culture     Status: None   Collection Time: 10/25/22 12:55 AM   Specimen: Urine, Catheterized  Result Value Ref Range Status   Specimen Description URINE, CATHETERIZED  Final   Special Requests NONE  Final   Culture   Final    NO GROWTH Performed at [facility], [address], [place], [place] [address]    Report Status 10/26/2022 FINAL  Final  Resp panel by RT-PCR (RSV, Flu A&B, Covid) Anterior Nasal Swab     Status: Abnormal   Collection Time: 10/27/22 11:08 AM   Specimen: Anterior Nasal Swab  Result Value Ref Range Status   SARS Coronavirus 2 by RT PCR POSITIVE (A) NEGATIVE Final    Comment: (NOTE) SARS-CoV-2 target  nucleic acids are DETECTED.  The SARS-CoV-2 RNA is generally detectable in upper respiratory specimens during the acute phase of infection. Positive results are indicative of the presence of the identified virus, but do not rule out bacterial infection or co-infection with other pathogens not detected by the test. Clinical correlation with patient history and other diagnostic information is necessary to determine patient infection status. The expected result is Negative.  Fact Sheet for Patients: EntrepreneurPulse.com.au  Fact Sheet for Healthcare Providers: IncredibleEmployment.be  This test is not yet approved or cleared by the [place] FDA and  has been authorized for detection and/or diagnosis of SARS-CoV-2 by FDA under an Emergency Use Authorization (EUA).  This EUA will remain in effect (meaning this test can be used) for the duration of  the COVID-19 declaration under Section 564(b)(1) of the A  ct, 21 U.S.C. section 360bbb-3(b)(1), unless the authorization is terminated or revoked sooner.     Influenza A by PCR NEGATIVE NEGATIVE Final   Influenza B by PCR NEGATIVE NEGATIVE Final    Comment: (NOTE) The Xpert Xpress SARS-CoV-2/FLU/RSV plus assay is intended as an aid in the diagnosis of influenza from Nasopharyngeal swab specimens and should not be used as a sole basis for treatment. Nasal washings and aspirates are unacceptable for Xpert Xpress SARS-CoV-2/FLU/RSV testing.  Fact Sheet for Patients: EntrepreneurPulse.com.au  Fact Sheet for Healthcare Providers: IncredibleEmployment.be  This test is not yet approved or cleared by the [place] FDA and has been authorized for detection and/or diagnosis of SARS-CoV-2 by FDA under an Emergency Use Authorization (EUA). This EUA will remain in effect (meaning this test can be used) for the duration of the COVID-19 declaration under Section  564(b)(1) of the Act, 21 U.S.C. section 360bbb-3(b)(1), unless the authorization is terminated or revoked.     Resp Syncytial Virus by PCR NEGATIVE NEGATIVE Final    Comment: (NOTE) Fact Sheet for Patients: EntrepreneurPulse.com.au  Fact Sheet for Healthcare Providers: IncredibleEmployment.be  This test is not yet approved or cleared by the [place] FDA and has been authorized for detection and/or diagnosis of SARS-CoV-2 by FDA under an Emergency Use Authorization (EUA). This EUA will remain in effect (meaning this test can be used) for the duration of the COVID-19 declaration under Section 564(b)(1) of the Act, 21 U.S.C. section 360bbb-3(b)(1), unless the authorization is terminated or revoked.  Performed at [facility], [place] [address] [place], [place] [address]          Radiology Studies: No results found.      Scheduled Meds:  ascorbic acid  500 mg Oral Daily   aspirin EC  81 mg Oral See admin instructions   Chlorhexidine Gluconate Cloth  6 each Topical Daily   diltiazem  120 mg Oral Daily   enoxaparin (LOVENOX) injection  40 mg Subcutaneous Q24H   fluticasone  1 spray Each Nare Daily   insulin aspart  0-9 Units Subcutaneous TID WC   loratadine  10 mg Oral Daily   nirmatrelvir/ritonavir  3 tablet Oral BID   potassium chloride  40 mEq Oral Once   potassium chloride  40 mEq Oral Q4H   potassium chloride  40 mEq Oral Once   saccharomyces boulardii  250 mg Oral BID   Continuous Infusions:  ceFEPime (MAXIPIME) IV 2 g (10/29/22 1042)   lactated ringers with KCl 20 mEq/L     metronidazole 500 mg (10/28/22 2300)     LOS: 4 days    Time spent: 35 minutes    Torrey Ballinas A Jasper Ruminski, MD [facility]   If 7PM-7AM, please contact night-coverage [email or web address]  10/29/2022, 12:21 PM

## 2022-10-30 DIAGNOSIS — K5792 Diverticulitis of intestine, part unspecified, without perforation or abscess without bleeding: Secondary | ICD-10-CM | POA: Diagnosis not present

## 2022-10-30 LAB — GLUCOSE, CAPILLARY
Glucose-Capillary: 120 mg/dL — ABNORMAL HIGH (ref 70–99)
Glucose-Capillary: 137 mg/dL — ABNORMAL HIGH (ref 70–99)
Glucose-Capillary: 91 mg/dL (ref 70–99)
Glucose-Capillary: 140 mg/dL — ABNORMAL HIGH (ref 70–99)

## 2022-10-30 LAB — BASIC METABOLIC PANEL
Anion gap: 10 (ref 5–15)
BUN: 8 mg/dL (ref 8–23)
CO2: 21 mmol/L — ABNORMAL LOW (ref 22–32)
Calcium: 8.8 mg/dL — ABNORMAL LOW (ref 8.9–10.3)
Chloride: 106 mmol/L (ref 98–111)
Creatinine, Ser: 0.64 mg/dL (ref 0.44–1.00)
GFR, Estimated: >60 mL/min (ref >60)
Glucose, Bld: 115 mg/dL — ABNORMAL HIGH (ref 70–99)
Potassium: 4.7 mmol/L (ref 3.5–5.1)
Sodium: 137 mmol/L (ref 135–145)

## 2022-10-30 LAB — CBC
HCT: 39.3 % (ref 36.0–46.0)
Hemoglobin: 12.4 g/dL (ref 12.0–15.0)
MCH: 28.1 pg (ref 26.0–34.0)
MCHC: 31.6 g/dL (ref 30.0–36.0)
MCV: 88.9 fL (ref 80.0–100.0)
Platelets: 222 10*3/uL (ref 150–400)
RBC: 4.42 MIL/uL (ref 3.87–5.11)
RDW: 14.3 % (ref 11.5–15.5)
WBC: 4.7 10*3/uL (ref 4.0–10.5)
nRBC: 0 % (ref 0.0–0.2)

## 2022-10-30 LAB — C-REACTIVE PROTEIN: CRP: 0.5 mg/dL (ref <1.0)

## 2022-10-30 NOTE — Progress Notes (Signed)
PROGRESS NOTE    Angel [place]  ZOX:[id number] DOB: Jan 28, 1941 DOA: 10/24/2022 PCP: Charyl Dancer, NP   Brief Narrative: 82 year old with past medical history significant for IBS-D, diabetes type 2, A-fib presented to the ED complaining of rectal pain.  She has been taking fiber pills for more than 20 years, she usually have diarrhea.  After she discontinue fiber pills she developed constipation.  Patient took some Imodium, to avoid having diarrhea at a  family house.  Last bowel movement 4 days prior to admission.  She developed the day prior to admission severe rectal pain lasted for 6 hours.  Evaluation in the ED she was found to have mild leukocytosis, CT scan showed large diverticulum with superimposed diverticulitis.   Assessment & Plan:   Principal Problem:   Acute diverticulitis Active Problems:   Diabetes mellitus type II, controlled ([place])   Rectal pain   Constipation   Atrial fibrillation, chronic (HCC)   1-Acute Diverticulitis: Continue with cefepime and Flagyl day 4. Report  improvement of abdominal pain Started  florastore Worsening diarrhea could be related to covid Had 3 BM yesterday, incontinence.  Diarrhea improving.   Rectal Pain,  Suspect related to fecal impaction.  Resolved after enema GI order Miralax.  Resolved.   Chronic A fib: On Cardizem and baby aspirin.  DM type 2:  Continue with a sliding scale insulin  Hypokalemia; Replaced oral.    Endometrial fluid attenuation; she will need follow up with GYN. Patient and family aware.  Headaches; ibuprofen, PRN tramadol/  URI; Covid viral illness.  Covid Positive.  Started Paxlovid.  Started Flonase. Claritin.  Stable.   Estimated body mass index is 26.13 kg/m as calculated from the following:   Height as of this encounter: '4\' 10"'$  (1.473 m).   Weight as of this encounter: 56.7 kg.   DVT prophylaxis: Lovenox Code Status: Full code Family Communication: Care discussed with patient. Family  doesn't answer calls Disposition Plan:  Status is: Observation The patient will require care spanning > 2 midnights and should be moved to inpatient because: still with diarrhea, requiring IV fluids.     Consultants:  GI  Procedures:  none  Antimicrobials:    Subjective: She had 3 BM yesterday, incontinence.  Denies abdominal pain.  One BM today so far.   Objective: Vitals:   10/29/22 1615 10/29/22 2029 10/30/22 0528 10/30/22 0800  BP: 122/63 129/67 (!) 112/58 130/61  Pulse: 64 64 60 61  Resp: '17 16 16   '$ Temp: 98.2 F (36.8 C) 98.4 F (36.9 C) 98.2 F (36.8 C) 98.2 F (36.8 C)  TempSrc: Oral Oral Oral Oral  SpO2: 95% 95% 94% 95%  Weight:      Height:        Intake/Output Summary (Last 24 hours) at 10/30/2022 1307 Last data filed at 10/30/2022 1146 Gross per 24 hour  Intake 2611.73 ml  Output 6400 ml  Net -3788.27 ml    Filed Weights   10/28/22 1400  Weight: 56.7 kg    Examination:  General exam: NAD Respiratory system: CTA Cardiovascular system: S 1, S 2 RRR Gastrointestinal system: BS present, soft,nt Central nervous system: Alert Extremities: No edema   Data Reviewed: I have personally reviewed following labs and imaging studies  CBC: Recent Labs  Lab 10/24/22 1320 10/25/22 1156 10/26/22 0159 10/27/22 1128 10/28/22 0831 10/29/22 0206 10/30/22 0614  WBC 13.8*   < > 11.7* 7.1 5.7 4.6 4.7  NEUTROABS 11.9*  --   --   --   --   --   --  HGB 13.0   < > 12.7 12.2 12.3 11.7* 12.4  HCT 40.4   < > 38.4 36.6 37.5 36.2 39.3  MCV 90.0   < > 87.5 85.9 87.0 87.4 88.9  PLT 273   < > 235 191 224 232 222   < > = values in this interval not displayed.    Basic Metabolic Panel: Recent Labs  Lab 10/26/22 0159 10/27/22 1128 10/28/22 0831 10/29/22 0206 10/30/22 0614  NA 137 134* 138 139 137  K 4.3 3.6 3.6 2.8* 4.7  CL 105 107 111 107 106  CO2 21* 18* 19* 22 21*  GLUCOSE 155* 111* 117* 184* 115*  BUN 11 8 7* 8 8  CREATININE 0.72 0.58 0.48 0.75  0.64  CALCIUM 8.9 8.5* 8.4* 8.4* 8.8*  MG  --  2.2 2.3 2.2  --     GFR: Estimated Creatinine Clearance: 40.4 mL/min (by C-G formula based on SCr of 0.64 mg/dL). Liver Function Tests: No results for input(s): "AST", "ALT", "ALKPHOS", "BILITOT", "PROT", "ALBUMIN" in the last 168 hours. No results for input(s): "LIPASE", "AMYLASE" in the last 168 hours. No results for input(s): "AMMONIA" in the last 168 hours. Coagulation Profile: No results for input(s): "INR", "PROTIME" in the last 168 hours. Cardiac Enzymes: No results for input(s): "CKTOTAL", "CKMB", "CKMBINDEX", "TROPONINI" in the last 168 hours. BNP (last 3 results) No results for input(s): "PROBNP" in the last 8760 hours. HbA1C: No results for input(s): "HGBA1C" in the last 72 hours. CBG: Recent Labs  Lab 10/29/22 1127 10/29/22 1650 10/29/22 2025 10/30/22 0756 10/30/22 1210  GLUCAP 114* 110* 96 120* 137*    Lipid Profile: No results for input(s): "CHOL", "HDL", "LDLCALC", "TRIG", "CHOLHDL", "LDLDIRECT" in the last 72 hours. Thyroid Function Tests: No results for input(s): "TSH", "T4TOTAL", "FREET4", "T3FREE", "THYROIDAB" in the last 72 hours. Anemia Panel: No results for input(s): "VITAMINB12", "FOLATE", "FERRITIN", "TIBC", "IRON", "RETICCTPCT" in the last 72 hours. Sepsis Labs: No results for input(s): "PROCALCITON", "LATICACIDVEN" in the last 168 hours.  Recent Results (from the past 240 hour(s))  Urine Culture     Status: None   Collection Time: 10/25/22 12:55 AM   Specimen: Urine, Catheterized  Result Value Ref Range Status   Specimen Description URINE, CATHETERIZED  Final   Special Requests NONE  Final   Culture   Final    NO GROWTH Performed at [facility], [address] [place], [place] [address]    Report Status 10/26/2022 FINAL  Final  Resp panel by RT-PCR (RSV, Flu A&B, Covid) Anterior Nasal Swab     Status: Abnormal   Collection Time: 10/27/22 11:08 AM   Specimen: Anterior Nasal Swab   Result Value Ref Range Status   SARS Coronavirus 2 by RT PCR POSITIVE (A) NEGATIVE Final    Comment: (NOTE) SARS-CoV-2 target nucleic acids are DETECTED.  The SARS-CoV-2 RNA is generally detectable in upper respiratory specimens during the acute phase of infection. Positive results are indicative of the presence of the identified virus, but do not rule out bacterial infection or co-infection with other pathogens not detected by the test. Clinical correlation with patient history and other diagnostic information is necessary to determine patient infection status. The expected result is Negative.  Fact Sheet for Patients: EntrepreneurPulse.com.au  Fact Sheet for Healthcare Providers: IncredibleEmployment.be  This test is not yet approved or cleared by the [place] FDA and  has been authorized for detection and/or diagnosis of SARS-CoV-2 by FDA under an Emergency Use Authorization (  EUA).  This EUA will remain in effect (meaning this test can be used) for the duration of  the COVID-19 declaration under Section 564(b)(1) of the A ct, 21 U.S.C. section 360bbb-3(b)(1), unless the authorization is terminated or revoked sooner.     Influenza A by PCR NEGATIVE NEGATIVE Final   Influenza B by PCR NEGATIVE NEGATIVE Final    Comment: (NOTE) The Xpert Xpress SARS-CoV-2/FLU/RSV plus assay is intended as an aid in the diagnosis of influenza from Nasopharyngeal swab specimens and should not be used as a sole basis for treatment. Nasal washings and aspirates are unacceptable for Xpert Xpress SARS-CoV-2/FLU/RSV testing.  Fact Sheet for Patients: EntrepreneurPulse.com.au  Fact Sheet for Healthcare Providers: IncredibleEmployment.be  This test is not yet approved or cleared by the [place] FDA and has been authorized for detection and/or diagnosis of SARS-CoV-2 by FDA under an Emergency Use Authorization (EUA).  This EUA will remain in effect (meaning this test can be used) for the duration of the COVID-19 declaration under Section 564(b)(1) of the Act, 21 U.S.C. section 360bbb-3(b)(1), unless the authorization is terminated or revoked.     Resp Syncytial Virus by PCR NEGATIVE NEGATIVE Final    Comment: (NOTE) Fact Sheet for Patients: EntrepreneurPulse.com.au  Fact Sheet for Healthcare Providers: IncredibleEmployment.be  This test is not yet approved or cleared by the [place] FDA and has been authorized for detection and/or diagnosis of SARS-CoV-2 by FDA under an Emergency Use Authorization (EUA). This EUA will remain in effect (meaning this test can be used) for the duration of the COVID-19 declaration under Section 564(b)(1) of the Act, 21 U.S.C. section 360bbb-3(b)(1), unless the authorization is terminated or revoked.  Performed at [facility], [place] [address], [place], [place] [address]          Radiology Studies: No results found.      Scheduled Meds:  ascorbic acid  500 mg Oral Daily   aspirin EC  81 mg Oral See admin instructions   Chlorhexidine Gluconate Cloth  6 each Topical Daily   diltiazem  120 mg Oral Daily   enoxaparin (LOVENOX) injection  40 mg Subcutaneous Q24H   fluticasone  1 spray Each Nare Daily   insulin aspart  0-9 Units Subcutaneous TID WC   loratadine  10 mg Oral Daily   nirmatrelvir/ritonavir  3 tablet Oral BID   potassium chloride  40 mEq Oral Once   saccharomyces boulardii  250 mg Oral BID   Continuous Infusions:  ceFEPime (MAXIPIME) IV 2 g (10/30/22 0857)   lactated ringers 1,000 mL with potassium chloride 20 mEq infusion 100 mL/hr at 10/30/22 1215   metronidazole 500 mg (10/30/22 0041)     LOS: 5 days    Time spent: 35 minutes    Tynesha Free A Mahina Salatino, MD [facility]   If 7PM-7AM, please contact night-coverage [email or web address]  10/30/2022, 1:07 PM

## 2022-10-30 NOTE — Plan of Care (Signed)
  Problem: Education: Goal: Ability to describe self-care measures that may prevent or decrease complications (Diabetes Survival Skills Education) will improve Outcome: Progressing   Problem: Coping: Goal: Ability to adjust to condition or change in health will improve Outcome: Progressing   Problem: Fluid Volume: Goal: Ability to maintain a balanced intake and output will improve Outcome: Progressing   

## 2022-10-30 NOTE — TOC Initial Note (Signed)
Transition of Care (TOC) - Initial/Assessment Note  Was asked by MD to speak to patient regarding , she wants someone to assist her with her diarrhea at home.   PT no PT follow up recommended, patient ambulated 650 feet. Patient stated she has no problems walking, she cooks , cleans and drives herself. However, since she drank the prep for her colonoscopy she has been incontinent of loose stools. Explained insurance does not cover someone to come to her home when is she incontinent.   Patient voiced understanding.  Patient Details  Name: Angel [place] MRN: [id number] Date of Birth: 1941-09-18  Transition of Care [facility]) CM/SW Contact:    Marilu Favre, RN Phone Number: 10/30/2022, 12:29 PM  Clinical Narrative:                   Expected Discharge Plan: Home/Self Care Barriers to Discharge: Continued Medical Work up   Patient Goals and CMS Choice Patient states their goals for this hospitalization and ongoing recovery are:: to return to home          Expected Discharge Plan and [place] arrangements for the past 2 months: Single Family Home                   DME Agency: NA       HH Arranged: NA          Prior Living Arrangements/Services Living arrangements for the past 2 months: Single Family Home Lives with:: Self Patient language and need for interpreter reviewed:: Yes Do you feel safe going back to the place where you live?: Yes            Criminal Activity/Legal Involvement Pertinent to Current Situation/Hospitalization: No - Comment as needed  Activities of Daily Living      Permission Sought/Granted   Permission granted to share information with : No              Emotional Assessment       Orientation: : Oriented to Self, Oriented to Place, Oriented to  Time, Oriented to Situation      Admission diagnosis:  Urinary retention [R33.9] Diverticulitis [K57.92] Acute diverticulitis [K57.92] Patient Active Problem List    Diagnosis Date Noted   Acute diverticulitis 10/25/2022   Rectal pain 10/25/2022   Constipation 10/25/2022   Atrial fibrillation, chronic ([place]) 77/82/4235   Acute diastolic heart failure ([place]) 07/28/2022   Anxiety 07/28/2022   Hyperlipidemia associated with type 2 diabetes mellitus ([place]) 01/26/2022   History of basal cell cancer 01/26/2022   Rosacea 01/26/2022   Statin intolerance 01/26/2022   Low back pain 03/05/2019   PAF (paroxysmal atrial fibrillation) ([place]) 11/26/2018   Aortic atherosclerosis ([place]) 07/17/2017   Diabetes mellitus type II, controlled ([place]) 07/12/2015   History of IBS - followed by [facility] 03/30/2014   PCP:  Charyl Dancer, NP Pharmacy:   [facility] [place], [address] [place] [address] Phone: [phone] Fax: [phone]     Social Determinants of Health (SDOH) Social History: SDOH Screenings   Food Insecurity: No Food Insecurity (12/05/2021)  Housing: Low Risk  (12/05/2021)  Transportation Needs: No Transportation Needs (12/05/2021)  Alcohol Screen: Low Risk  (12/05/2021)  Depression (PHQ2-9): Low Risk  (12/05/2021)  Financial Resource Strain: Low Risk  (12/05/2021)  Physical Activity: Sufficiently Active (12/05/2021)  Social Connections: Moderately Integrated (12/05/2021)  Stress: No Stress Concern Present (12/05/2021)  Tobacco Use: Medium Risk (10/24/2022)   SDOH Interventions:     Readmission Risk Interventions     No data to display

## 2022-10-31 DIAGNOSIS — R194 Change in bowel habit: Secondary | ICD-10-CM | POA: Diagnosis not present

## 2022-10-31 DIAGNOSIS — K5792 Diverticulitis of intestine, part unspecified, without perforation or abscess without bleeding: Secondary | ICD-10-CM | POA: Diagnosis not present

## 2022-10-31 LAB — GLUCOSE, CAPILLARY
Glucose-Capillary: 133 mg/dL — ABNORMAL HIGH (ref 70–99)
Glucose-Capillary: 118 mg/dL — ABNORMAL HIGH (ref 70–99)
Glucose-Capillary: 174 mg/dL — ABNORMAL HIGH (ref 70–99)
Glucose-Capillary: 105 mg/dL — ABNORMAL HIGH (ref 70–99)

## 2022-10-31 LAB — BASIC METABOLIC PANEL
Anion gap: 9 (ref 5–15)
BUN: 9 mg/dL (ref 8–23)
CO2: 21 mmol/L — ABNORMAL LOW (ref 22–32)
Calcium: 9 mg/dL (ref 8.9–10.3)
Chloride: 104 mmol/L (ref 98–111)
Creatinine, Ser: 0.73 mg/dL (ref 0.44–1.00)
GFR, Estimated: >60 mL/min (ref >60)
Glucose, Bld: 111 mg/dL — ABNORMAL HIGH (ref 70–99)
Potassium: 4.6 mmol/L (ref 3.5–5.1)
Sodium: 134 mmol/L — ABNORMAL LOW (ref 135–145)

## 2022-10-31 LAB — C-REACTIVE PROTEIN: CRP: <0.5 mg/dL (ref <1.0)

## 2022-10-31 MED ORDER — HYDROCORTISONE 1 % EX CREA
TOPICAL_CREAM | CUTANEOUS | Status: DC | PRN
  Filled 2022-10-31: qty 28

## 2022-10-31 MED ORDER — NYSTATIN 100000 UNIT/GM EX POWD
Freq: Three times a day (TID) | CUTANEOUS | Status: DC
  Administered 2022-11-02: 1 via TOPICAL
  Filled 2022-10-31: qty 15

## 2022-10-31 MED ORDER — COLESTIPOL HCL 1 G PO TABS
1.0000 g | ORAL_TABLET | Freq: Every day | ORAL | Status: DC
  Administered 2022-10-31: 1 g via ORAL
  Filled 2022-10-31 (×3): qty 1

## 2022-10-31 MED ORDER — PSYLLIUM 95 % PO PACK
1.0000 | PACK | Freq: Two times a day (BID) | ORAL | Status: DC
  Administered 2022-10-31 – 2022-11-02 (×4): 1 via ORAL
  Filled 2022-10-31 (×5): qty 1

## 2022-10-31 MED ORDER — COLESTIPOL HCL 1 G PO TABS: 1.0000 g | ORAL_TABLET | Freq: Every day | ORAL | Status: DC

## 2022-10-31 MED ORDER — GERHARDT'S BUTT CREAM
TOPICAL_CREAM | Freq: Three times a day (TID) | CUTANEOUS | Status: DC
  Filled 2022-10-31: qty 1

## 2022-10-31 MED ORDER — TRIAMCINOLONE 0.1 % CREAM:EUCERIN CREAM 1:1
TOPICAL_CREAM | Freq: Three times a day (TID) | CUTANEOUS | Status: DC
  Filled 2022-10-31 (×2): qty 1

## 2022-10-31 MED ORDER — CALCIUM POLYCARBOPHIL 625 MG PO TABS
625.0000 mg | ORAL_TABLET | Freq: Every day | ORAL | Status: DC
  Administered 2022-10-31 – 2022-11-02 (×3): 625 mg via ORAL
  Filled 2022-10-31 (×3): qty 1

## 2022-10-31 NOTE — Plan of Care (Signed)
  Problem: Education: Goal: Ability to describe self-care measures that may prevent or decrease complications (Diabetes Survival Skills Education) will improve Outcome: Progressing Goal: Individualized Educational Video(s) Outcome: Progressing   Problem: Coping: Goal: Ability to adjust to condition or change in health will improve Outcome: Progressing   Problem: Fluid Volume: Goal: Ability to maintain a balanced intake and output will improve Outcome: Progressing   Problem: Health Behavior/Discharge Planning: Goal: Ability to identify and utilize available resources and services will improve Outcome: Progressing Goal: Ability to manage health-related needs will improve Outcome: Progressing   Problem: Metabolic: Goal: Ability to maintain appropriate glucose levels will improve Outcome: Progressing   Problem: Nutritional: Goal: Maintenance of adequate nutrition will improve Outcome: Progressing Goal: Progress toward achieving an optimal weight will improve Outcome: Progressing   Problem: Skin Integrity: Goal: Risk for impaired skin integrity will decrease Outcome: Not Progressing   Problem: Tissue Perfusion: Goal: Adequacy of tissue perfusion will improve Outcome: Progressing   Problem: Education: Goal: Knowledge of General Education information will improve Description: Including pain rating scale, medication(s)/side effects and non-pharmacologic comfort measures Outcome: Progressing

## 2022-10-31 NOTE — Progress Notes (Signed)
Progress Note  Primary GI: Dr. Fuller Plan   Subjective  Chief Complaint: Fecal incontinence, in setting of recently treated diverticulitis treated on IBS-D  Patient history of IBS-D admitted 10/24/2022 with constipation. CT showed well-defined collection of fecal material adjacent to colon and surrounding inflammatory changes possible stercoral colitis and rectal prolapse, superimposed diverticulitis on current exam, treated Flagyl and cefepime, MiraLAX prep overnight. GI and signed off on 12/28. Per patient she was diagnosed with COVID due to cough and sinus issues 12/29, started on paxlovid.  Patient is very upset when I come to the room saying "been laying in my own stools for the last 6 days". Has only 2 documented stools yesterday and none today, states she was just cleaned up 10 minutes ago.  And states the nurses are not documenting the stools. She states prior to this she was having urinary incontinence, would use depends at home and have where once to twice weekly fecal incontinence.  Would take Imodium as needed prior to functions for leaving. She states since the MiraLAX purge patient is been having fecal incontinence like she is never had previously. Patient denies fever, chills, abdominal pain.  Patient has last dose of 14 total doses of cefepime and metronidazole.. States her shortness of breath is better continues to have some sinus issues. Patient is very concerned for going home with fecal incontinence, does not have help for cleaning herself. Also complaining of rash on her back and vaginal itching.  Has catheter in place currently. Patient normally on fiber at home    Objective   Vital signs in last 24 hours: Temp:  [97.7 F (36.5 C)-98.4 F (36.9 C)] 98.4 F (36.9 C) (01/02 0825) Pulse Rate:  [68-69] 69 (01/02 0825) Resp:  [16-20] 18 (01/02 0825) BP: (115-122)/(51-65) 115/58 (01/02 0825) SpO2:  [94 %-98 %] 94 % (01/02 0825) Last BM Date : 10/31/22 Last BM  recorded by nurses in past 5 days Stool Type: Type 5 (Soft blobs with clear-cut edges) (10/30/2022 10:00 PM)  General:   female in no acute distress  Heart:  Regular rate and rhythm; no murmurs Pulm: Clear anteriorly; no wheezing Abdomen:  Soft, Obese AB, Active bowel sounds. No tenderness . Without guarding and Without rebound, No organomegaly appreciated. Rectal: Erythema and slight induration around rectum, no abscess or discharge.  Upon expection of rectum moderate to large amount of soft brown stool empties from her rectum.  Markedly decreased rectal tone.  Normal rectal tone, no appreciated internal hemorrhoids, tender, no masses, did not perform Hemoccult. Extremities:  without  edema.  Erythematous macular rash on back.  Can see picture in epic images. Neurologic:  Alert and  oriented x4;  No focal deficits.  Psych:  Cooperative. Normal mood and affect.  Intake/Output from previous day: 01/01 0701 - 01/02 0700 In: 1320.1 [P.O.:720; I.V.:400.1; IV Piggyback:200] Out: 2850 [Urine:2850] Intake/Output this shift: Total I/O In: 240 [P.O.:240] Out: 450 [Urine:450]  Studies/Results: No results found.  Lab Results: Recent Labs    10/29/22 0206 10/30/22 0614  WBC 4.6 4.7  HGB 11.7* 12.4  HCT 36.2 39.3  PLT 232 222   BMET Recent Labs    10/29/22 0206 10/30/22 0614 10/31/22 0746  NA 139 137 134*  K 2.8* 4.7 4.6  CL 107 106 104  CO2 22 21* 21*  GLUCOSE 184* 115* 111*  BUN '8 8 9  '$ CREATININE 0.75 0.64 0.73  CALCIUM 8.4* 8.8* 9.0   LFT No results for input(s): "PROT", "ALBUMIN", "AST", "  ALT", "ALKPHOS", "BILITOT", "BILIDIR", "IBILI" in the last 72 hours. PT/INR No results for input(s): "LABPROT", "INR" in the last 72 hours.   Scheduled Meds:  ascorbic acid  500 mg Oral Daily   aspirin EC  81 mg Oral See admin instructions   Chlorhexidine Gluconate Cloth  6 each Topical Daily   diltiazem  120 mg Oral Daily   enoxaparin (LOVENOX) injection  40 mg Subcutaneous Q24H    fluticasone  1 spray Each Nare Daily   insulin aspart  0-9 Units Subcutaneous TID WC   loratadine  10 mg Oral Daily   nirmatrelvir/ritonavir  3 tablet Oral BID   potassium chloride  40 mEq Oral Once   saccharomyces boulardii  250 mg Oral BID   Continuous Infusions:  ceFEPime (MAXIPIME) IV 2 g (10/31/22 1032)   lactated ringers 1,000 mL with potassium chloride 20 mEq infusion 100 mL/hr at 10/31/22 0604   metronidazole 500 mg (10/31/22 0100)     Impression/Plan:   Fecal incontinence after receiving antibiotics for diverticulitis, and MiraLAX prep for constipation CT abdomen pelvis showed well-defined collection fecal material adjacent colon inflammatory changes possible diverticulitis/stercoral colitis/rectal prolapse Treated with Flagyl and cefepime has 1 more day No further abdominal pain, no fevers no chills.  Having more formed stools but fecal incontinence. From talking with the patient sounds like she has a history of minor fecal incontinence likely pelvic floor dysfunction with urinary incontinence as well made worse with antibiotics, COVID, Paxlovid. Will add back on fiber for bulking twice daily, long discussion with the patient about how this is for bulking and does not cause loose stools. May benefit from pelvic floor referral outpatient Has follow-up appointment in our office 11/15/2022 with Dr Fuller Plan, encourage patient keep appointment  Rectal irritation Will add on rectal cream, patient may also have yeast infection.  Discussed with primary attending.  Diverticulitis Last day of cefepime, Flagyl No more abdominal discomfort 11/2017 colonoscopy for hematochezia and positive Cologuard and irregular appearance of sigmoid on CT scan showed left-sided severe diverticulosis associated with with colonic narrowing, spasm peridiverticular erythema, no bleeding nonbleeding internal hemorrhoids random biopsies no inflammation, increased lymphocytes or subfoveal collagen  thickening.  10/27/2022 positive COVID On Paxlovid No shortness of breath or chest pain.   Principal Problem:   Acute diverticulitis Active Problems:   Diabetes mellitus type II, controlled ([place])   Rectal pain   Constipation   Atrial fibrillation, chronic ([place])    LOS: 6 days   Angel Holland  10/31/2022, 11:37 AM

## 2022-10-31 NOTE — Progress Notes (Signed)
Physical Therapy Treatment Patient Details Name: Angel Holland MRN: [id number] DOB: 03-07-1941 Today's Date: 10/31/2022   History of Present Illness 82 yo female presents to [facility] on 12/26 with rectal and abdominal pain. CT abd shows large diverticulum with superimposed diverticulitis. covid +. PMH includes IBS-D, diabetes type 2, A-fib.    PT Comments    Pt received in supine, c/o frustration with frequent loose stools past few days but agreeable to therapy session with emphasis on safety with transfers/gait and pressure relief strategies. Pt encouraged to consider bowel schedule (assist to pivot to/from [facility] with nursing/mobility staff every 2-3 hours throughout the day) however pt irritable with all suggestions given and with poor insight into pressure relief needs given increased skin breakdown. MD/RN notified of pt decreased ability to perform her own ADLs and now requesting physical assist for all OOB needs, would benefit from OT/SLP evaluations with emphasis on cognitive assessment and building confidence with mobility/ADLs and self-care in particular. Pt needing up to min guard to minA today for transfers and gait without AD and pt requesting assist for peri care. VSS on RA. Disposition updated per discussion with supervising PT Alexa S as pt with functional decline and inability to perform her own self-care tasks. Pt continues to benefit from PT services to progress toward functional mobility goals.   Recommendations for follow up therapy are one component of a multi-disciplinary discharge planning process, led by the attending physician.  Recommendations may be updated based on patient status, additional functional criteria and insurance authorization.  Follow Up Recommendations  Skilled nursing-short term rehab (<3 hours/day) (pending progress; if SNF not available, consider OPPT for pelvic floor therapy) Can patient physically be transported by private vehicle: Yes   Assistance Recommended  at Discharge Frequent or constant Supervision/Assistance (pt now stating she cannot manage her own ADLs)  Patient can return home with the following A little help with walking and/or transfers;A little help with bathing/dressing/bathroom;Help with stairs or ramp for entrance;Assistance with cooking/housework;Assist for transportation   Equipment Recommendations  Other (comment) (defer to post-acute; she states she has a cane at home)    Recommendations for Other Services OT consult;Speech consult (SLP cognitive eval)     Precautions / Restrictions Precautions Precautions: Fall Precaution Comments: Airborne/Contact precs; bring briefs, bowel incontinence Restrictions Weight Bearing Restrictions: No     Mobility  Bed Mobility Overal bed mobility: Needs Assistance Bed Mobility: Rolling, Sidelying to Sit Rolling: Modified independent (Device/Increase time) Sidelying to sit: Supervision, HOB elevated       General bed mobility comments: increased time, cues for self-assist; pt using bed rails/HOB elevated    Transfers Overall transfer level: Needs assistance Equipment used: None, 1 person hand held assist Transfers: Sit to/from Stand Sit to Stand: Min guard           General transfer comment: from EOB and to/from [facility], cues for safe hand placement needed    Ambulation/Gait Ambulation/Gait assistance: Min guard, Min assist Gait Distance (Feet): 100 Feet (x2 with seated break) Assistive device: None, 1 person hand held assist Gait Pattern/deviations: Step-through pattern, Decreased stride length, Narrow base of support, Drifts right/left, Trunk flexed Gait velocity: decr     General Gait Details: Mildly unsteady, pt initially given hand held assist per her request progressing to min guard without HHA, but needing minA x2 occasions with turning/higher level balance challenges such as backward stepping and head turns. Pt reports anxiety regarding OOB mobility and not agreeable  to pivot OOB without staff assist due  to frequent incontinence and now with catheter.    Balance Overall balance assessment: Needs assistance Sitting-balance support: No upper extremity supported Sitting balance-Leahy Scale: Normal     Standing balance support: Single extremity supported, During functional activity Standing balance-Leahy Scale: Poor Standing balance comment: Fair with single UE support but intermittent LOB when not given HHA                            Cognition Arousal/Alertness: Awake/alert Behavior During Therapy: Anxious Overall Cognitive Status: No family/caregiver present to determine baseline cognitive functioning                                 General Comments: Pt oriented to self/situation and location (did not assess time), pt perseverative on bowel incontinence issues/discomfort and tangential throughout, needs frequent redirection to task. Poor insight into need for OOB including encouragement to try bowel schedule with staff getting her OOB to [facility] every couple of hours vs sitting up in chair and keeping BSC at bedside to pivot to PRN. Pt becomes more anxious when PTA proposing solutions to the issues pt is having with her incontinence and seems to be having some trouble cognitively problem solving her concerns. May benefit from OT vs SLP cognitive eval to further assess cognition.        Exercises Other Exercises Other Exercises: supine BLE AROM; ankle pumps, heel slides, hip abduction x1-3 reps ea for teachback, encouraged pt to continue TID throughout the day along with rolling for pressure relief and OOB to chair    General Comments General comments (skin integrity, edema, etc.): HR/SpO2 WFL on RA and no acute s/sx distress, no dizziness reported      Pertinent Vitals/Pain Pain Assessment Pain Assessment: Faces Faces Pain Scale: Hurts little more Pain Location: buttocks/peri area skin breakdown and upper back (rash on upper  back) Pain Descriptors / Indicators: Sore, Discomfort, Grimacing, Moaning, Tender, Other (Comment) ("raw") Pain Intervention(s): Monitored during session, Repositioned, Other (comment) (reviewed pressure relief strategies)     PT Goals (current goals can now be found in the care plan section) Acute Rehab PT Goals Patient Stated Goal: to get up and move around more, less bowel incontinence PT Goal Formulation: With patient Time For Goal Achievement: 11/11/22 Progress towards PT goals: Progressing toward goals    Frequency    Min 3X/week      PT Plan Discharge plan needs to be updated       AM-PAC PT "6 Clicks" Mobility   Outcome Measure  Help needed turning from your back to your side while in a flat bed without using bedrails?: None Help needed moving from lying on your back to sitting on the side of a flat bed without using bedrails?: A Little Help needed moving to and from a bed to a chair (including a wheelchair)?: A Little Help needed standing up from a chair using your arms (e.g., wheelchair or bedside chair)?: A Little Help needed to walk in hospital room?: A Lot (mod safety cues) Help needed climbing 3-5 steps with a railing? : A Lot 6 Click Score: 17    End of Session   Activity Tolerance: Patient tolerated treatment well;Patient limited by pain;Other (comment) (buttocks/back pain limiting seated tolerance) Patient left: in bed;with call bell/phone within reach;with bed alarm set;with SCD's reapplied;Other (comment) (heels floated, semi-sidelying toward her L with pillow to offload low back;  pt refusing OOB to chair adamantly) Nurse Communication: Mobility status;Precautions;Other (comment) (pt would benefit from bowel schedule (OOB to [facility] every hour or two), rolling Q2H since refusing up to chair, needs OT and SLP cog evals) PT Visit Diagnosis: Other abnormalities of gait and mobility (R26.89)     Time: 1030-1314 PT Time Calculation (min) (ACUTE ONLY): 29  min  Charges:  $Gait Training: 8-22 mins $Therapeutic Activity: 8-22 mins                     Beldon Nowling P., PTA Acute Rehabilitation Services Secure Chat Preferred 9a-5:30pm Office: [place] 10/31/2022, 3:06 PM

## 2022-10-31 NOTE — Progress Notes (Signed)
PROGRESS NOTE    CLYDE UPSHAW  MLY:[id number] DOB: 1941/08/02 DOA: 10/24/2022 PCP: Charyl Dancer, NP   Brief Narrative: 82 year old with past medical history significant for IBS-D, diabetes type 2, A-fib presented to the ED complaining of rectal pain.  She has been taking fiber pills for more than 20 years, she usually have diarrhea.  After she discontinue fiber pills she developed constipation.  Patient took some Imodium, to avoid having diarrhea at a  family house.  Last bowel movement 4 days prior to admission.  She developed the day prior to admission severe rectal pain lasted for 6 hours.  Evaluation in the ED she was found to have mild leukocytosis, CT scan showed large diverticulum with superimposed diverticulitis.  Subsequently diagnosed with Covid. Develops diarrhea, which has improved. GI re-consulted.   Assessment & Plan:   Principal Problem:   Acute diverticulitis Active Problems:   Diabetes mellitus type II, controlled ([place])   Rectal pain   Constipation   Atrial fibrillation, chronic ([facility])   1-Acute Diverticulitis: Continue with cefepime and Flagyl day 7/7 today. Last day.  Report  improvement of abdominal pain Started  florastore Worsening diarrhea could be related to covid and her IBS 2 BM documented yesterday.  GI to follow on patient today for diarrhea.   Rectal Pain,  Suspect related to fecal impaction.  Resolved after enema GI order Miralax.  Resolved.   Chronic A fib: On Cardizem and baby aspirin.  DM type 2:  Continue with a sliding scale insulin  Hypokalemia; Replaced oral.    Endometrial fluid attenuation; she will need follow up with GYN. Patient and family aware.  Headaches; ibuprofen, PRN tramadol/  URI; Covid viral illness.  Covid Positive.  Started Paxlovid.  Started Flonase. Claritin.  Stable.   Rash back; suspect contact dermitis.  Start Triamcinolone Eucerin.   Pubic yeast. Start Start Nystatin powder.   Estimated body mass  index is 26.13 kg/m as calculated from the following:   Height as of this encounter: '4\' 10"'$  (1.473 m).   Weight as of this encounter: 56.7 kg.   DVT prophylaxis: Lovenox Code Status: Full code Family Communication: Care discussed with patient. Family doesn't answer calls Disposition Plan:  Status is: Observation The patient will require care spanning > 2 midnights and should be moved to inpatient because: still with diarrhea, requiring IV fluids.     Consultants:  GI  Procedures:  none  Antimicrobials:    Subjective: 2 BM reported yesterday.  Discussed with Nurse Tech. Patient had small smear BM today.  When I went to room she was seating in stool. I informed staff, patient needs to be clean.  At times GI evaluation after rectal exam, patient had another large BM.   Objective: Vitals:   10/30/22 1641 10/30/22 2139 10/31/22 0600 10/31/22 0825  BP: 122/61 (!) 121/51 122/65 (!) 115/58  Pulse: 68  68 69  Resp: '16  20 18  '$ Temp: 98.4 F (36.9 C) 98.4 F (36.9 C) 97.7 F (36.5 C) 98.4 F (36.9 C)  TempSrc: Oral Oral Oral Oral  SpO2: 94% 95% 98% 94%  Weight:      Height:        Intake/Output Summary (Last 24 hours) at 10/31/2022 1354 Last data filed at 10/31/2022 1233 Gross per 24 hour  Intake 1080.07 ml  Output 2550 ml  Net -1469.93 ml    Filed Weights   10/28/22 1400  Weight: 56.7 kg    Examination:  General exam: NAD Respiratory  system: CTA Cardiovascular system: S 1, S 2 RRR Gastrointestinal system: BS present, soft, nt Central nervous system: alert Extremities: No edema Back; rash erythema.   Data Reviewed: I have personally reviewed following labs and imaging studies  CBC: Recent Labs  Lab 10/26/22 0159 10/27/22 1128 10/28/22 0831 10/29/22 0206 10/30/22 0614  WBC 11.7* 7.1 5.7 4.6 4.7  HGB 12.7 12.2 12.3 11.7* 12.4  HCT 38.4 36.6 37.5 36.2 39.3  MCV 87.5 85.9 87.0 87.4 88.9  PLT 235 191 224 232 810    Basic Metabolic Panel: Recent Labs   Lab 10/27/22 1128 10/28/22 0831 10/29/22 0206 10/30/22 0614 10/31/22 0746  NA 134* 138 139 137 134*  K 3.6 3.6 2.8* 4.7 4.6  CL 107 111 107 106 104  CO2 18* 19* 22 21* 21*  GLUCOSE 111* 117* 184* 115* 111*  BUN 8 7* '8 8 9  '$ CREATININE 0.58 0.48 0.75 0.64 0.73  CALCIUM 8.5* 8.4* 8.4* 8.8* 9.0  MG 2.2 2.3 2.2  --   --     GFR: Estimated Creatinine Clearance: 40.4 mL/min (by C-G formula based on SCr of 0.73 mg/dL). Liver Function Tests: No results for input(s): "AST", "ALT", "ALKPHOS", "BILITOT", "PROT", "ALBUMIN" in the last 168 hours. No results for input(s): "LIPASE", "AMYLASE" in the last 168 hours. No results for input(s): "AMMONIA" in the last 168 hours. Coagulation Profile: No results for input(s): "INR", "PROTIME" in the last 168 hours. Cardiac Enzymes: No results for input(s): "CKTOTAL", "CKMB", "CKMBINDEX", "TROPONINI" in the last 168 hours. BNP (last 3 results) No results for input(s): "PROBNP" in the last 8760 hours. HbA1C: No results for input(s): "HGBA1C" in the last 72 hours. CBG: Recent Labs  Lab 10/30/22 1210 10/30/22 1639 10/30/22 2217 10/31/22 0824 10/31/22 1212  GLUCAP 137* 91 140* 133* 118*    Lipid Profile: No results for input(s): "CHOL", "HDL", "LDLCALC", "TRIG", "CHOLHDL", "LDLDIRECT" in the last 72 hours. Thyroid Function Tests: No results for input(s): "TSH", "T4TOTAL", "FREET4", "T3FREE", "THYROIDAB" in the last 72 hours. Anemia Panel: No results for input(s): "VITAMINB12", "FOLATE", "FERRITIN", "TIBC", "IRON", "RETICCTPCT" in the last 72 hours. Sepsis Labs: No results for input(s): "PROCALCITON", "LATICACIDVEN" in the last 168 hours.  Recent Results (from the past 240 hour(s))  Urine Culture     Status: None   Collection Time: 10/25/22 12:55 AM   Specimen: Urine, Catheterized  Result Value Ref Range Status   Specimen Description URINE, CATHETERIZED  Final   Special Requests NONE  Final   Culture   Final    NO GROWTH Performed at  [facility], [address], [place], [place] [address]    Report Status 10/26/2022 FINAL  Final  Resp panel by RT-PCR (RSV, Flu A&B, Covid) Anterior Nasal Swab     Status: Abnormal   Collection Time: 10/27/22 11:08 AM   Specimen: Anterior Nasal Swab  Result Value Ref Range Status   SARS Coronavirus 2 by RT PCR POSITIVE (A) NEGATIVE Final    Comment: (NOTE) SARS-CoV-2 target nucleic acids are DETECTED.  The SARS-CoV-2 RNA is generally detectable in upper respiratory specimens during the acute phase of infection. Positive results are indicative of the presence of the identified virus, but do not rule out bacterial infection or co-infection with other pathogens not detected by the test. Clinical correlation with patient history and other diagnostic information is necessary to determine patient infection status. The expected result is Negative.  Fact Sheet for Patients: EntrepreneurPulse.com.au  Fact Sheet for Healthcare Providers: IncredibleEmployment.be  This test is not yet approved or cleared by the [place] and  has been authorized for detection and/or diagnosis of SARS-CoV-2 by FDA under an Emergency Use Authorization (EUA).  This EUA will remain in effect (meaning this test can be used) for the duration of  the COVID-19 declaration under Section 564(b)(1) of the A ct, 21 U.S.C. section 360bbb-3(b)(1), unless the authorization is terminated or revoked sooner.     Influenza A by PCR NEGATIVE NEGATIVE Final   Influenza B by PCR NEGATIVE NEGATIVE Final    Comment: (NOTE) The Xpert Xpress SARS-CoV-2/FLU/RSV plus assay is intended as an aid in the diagnosis of influenza from Nasopharyngeal swab specimens and should not be used as a sole basis for treatment. Nasal washings and aspirates are unacceptable for Xpert Xpress SARS-CoV-2/FLU/RSV testing.  Fact Sheet for Patients: EntrepreneurPulse.com.au  Fact  Sheet for Healthcare Providers: IncredibleEmployment.be  This test is not yet approved or cleared by the [place] FDA and has been authorized for detection and/or diagnosis of SARS-CoV-2 by FDA under an Emergency Use Authorization (EUA). This EUA will remain in effect (meaning this test can be used) for the duration of the COVID-19 declaration under Section 564(b)(1) of the Act, 21 U.S.C. section 360bbb-3(b)(1), unless the authorization is terminated or revoked.     Resp Syncytial Virus by PCR NEGATIVE NEGATIVE Final    Comment: (NOTE) Fact Sheet for Patients: EntrepreneurPulse.com.au  Fact Sheet for Healthcare Providers: IncredibleEmployment.be  This test is not yet approved or cleared by the [place] FDA and has been authorized for detection and/or diagnosis of SARS-CoV-2 by FDA under an Emergency Use Authorization (EUA). This EUA will remain in effect (meaning this test can be used) for the duration of the COVID-19 declaration under Section 564(b)(1) of the Act, 21 U.S.C. section 360bbb-3(b)(1), unless the authorization is terminated or revoked.  Performed at [facility], [place] [address], [place], [place] [address]          Radiology Studies: No results found.      Scheduled Meds:  ascorbic acid  500 mg Oral Daily   aspirin EC  81 mg Oral See admin instructions   Chlorhexidine Gluconate Cloth  6 each Topical Daily   diltiazem  120 mg Oral Daily   enoxaparin (LOVENOX) injection  40 mg Subcutaneous Q24H   fluticasone  1 spray Each Nare Daily   Gerhardt's butt cream   Topical TID   insulin aspart  0-9 Units Subcutaneous TID WC   loratadine  10 mg Oral Daily   nirmatrelvir/ritonavir  3 tablet Oral BID   nystatin   Topical TID   polycarbophil  625 mg Oral Daily   potassium chloride  40 mEq Oral Once   saccharomyces boulardii  250 mg Oral BID   triamcinolone 0.1 % cream : eucerin   Topical TID    Continuous Infusions:  ceFEPime (MAXIPIME) IV 2 g (10/31/22 1032)   lactated ringers 1,000 mL with potassium chloride 20 mEq infusion 100 mL/hr at 10/31/22 0604   metronidazole 500 mg (10/31/22 0100)     LOS: 6 days    Time spent: 35 minutes    Leina Babe A Maronda Caison, MD [facility]   If 7PM-7AM, please contact night-coverage [email or web address]  10/31/2022, 1:54 PM

## 2022-11-01 DIAGNOSIS — R197 Diarrhea, unspecified: Secondary | ICD-10-CM

## 2022-11-01 DIAGNOSIS — K5792 Diverticulitis of intestine, part unspecified, without perforation or abscess without bleeding: Secondary | ICD-10-CM | POA: Diagnosis not present

## 2022-11-01 LAB — BASIC METABOLIC PANEL
Anion gap: 12 (ref 5–15)
BUN: 8 mg/dL (ref 8–23)
CO2: 23 mmol/L (ref 22–32)
Calcium: 9.1 mg/dL (ref 8.9–10.3)
Chloride: 103 mmol/L (ref 98–111)
Creatinine, Ser: 0.68 mg/dL (ref 0.44–1.00)
GFR, Estimated: >60 mL/min (ref >60)
Glucose, Bld: 118 mg/dL — ABNORMAL HIGH (ref 70–99)
Potassium: 3.9 mmol/L (ref 3.5–5.1)
Sodium: 138 mmol/L (ref 135–145)

## 2022-11-01 LAB — CBC
HCT: 37.5 % (ref 36.0–46.0)
Hemoglobin: 12.2 g/dL (ref 12.0–15.0)
MCH: 28.6 pg (ref 26.0–34.0)
MCHC: 32.5 g/dL (ref 30.0–36.0)
MCV: 87.8 fL (ref 80.0–100.0)
Platelets: 252 10*3/uL (ref 150–400)
RBC: 4.27 MIL/uL (ref 3.87–5.11)
RDW: 14.2 % (ref 11.5–15.5)
WBC: 7.9 10*3/uL (ref 4.0–10.5)
nRBC: 0 % (ref 0.0–0.2)

## 2022-11-01 LAB — GLUCOSE, CAPILLARY
Glucose-Capillary: 150 mg/dL — ABNORMAL HIGH (ref 70–99)
Glucose-Capillary: 114 mg/dL — ABNORMAL HIGH (ref 70–99)
Glucose-Capillary: 149 mg/dL — ABNORMAL HIGH (ref 70–99)
Glucose-Capillary: 129 mg/dL — ABNORMAL HIGH (ref 70–99)

## 2022-11-01 LAB — C-REACTIVE PROTEIN: CRP: 0.5 mg/dL (ref <1.0)

## 2022-11-01 MED ORDER — COLESTIPOL HCL 1 G PO TABS
1.0000 g | ORAL_TABLET | Freq: Two times a day (BID) | ORAL | Status: DC
  Administered 2022-11-01 – 2022-11-02 (×3): 1 g via ORAL
  Filled 2022-11-01 (×4): qty 1

## 2022-11-01 MED ORDER — DICYCLOMINE HCL 10 MG PO CAPS
10.0000 mg | ORAL_CAPSULE | Freq: Three times a day (TID) | ORAL | Status: DC
  Administered 2022-11-01: 10 mg via ORAL
  Filled 2022-11-01 (×6): qty 1

## 2022-11-01 NOTE — Plan of Care (Signed)
  Problem: Education: Goal: Ability to describe self-care measures that may prevent or decrease complications (Diabetes Survival Skills Education) will improve Outcome: Progressing Goal: Individualized Educational Video(s) Outcome: Progressing   Problem: Coping: Goal: Ability to adjust to condition or change in health will improve Outcome: Progressing   Problem: Fluid Volume: Goal: Ability to maintain a balanced intake and output will improve Outcome: Progressing   Problem: Health Behavior/Discharge Planning: Goal: Ability to identify and utilize available resources and services will improve Outcome: Progressing Goal: Ability to manage health-related needs will improve Outcome: Progressing   Problem: Metabolic: Goal: Ability to maintain appropriate glucose levels will improve Outcome: Progressing   Problem: Nutritional: Goal: Maintenance of adequate nutrition will improve Outcome: Progressing Goal: Progress toward achieving an optimal weight will improve Outcome: Progressing   Problem: Skin Integrity: Goal: Risk for impaired skin integrity will decrease Outcome: Progressing   Problem: Tissue Perfusion: Goal: Adequacy of tissue perfusion will improve Outcome: Progressing   Problem: Education: Goal: Knowledge of General Education information will improve Description: Including pain rating scale, medication(s)/side effects and non-pharmacologic comfort measures Outcome: Progressing   Problem: Health Behavior/Discharge Planning: Goal: Ability to manage health-related needs will improve Outcome: Progressing   Problem: Clinical Measurements: Goal: Ability to maintain clinical measurements within normal limits will improve Outcome: Progressing Goal: Will remain free from infection Outcome: Progressing Goal: Diagnostic test results will improve Outcome: Progressing Goal: Respiratory complications will improve Outcome: Progressing Goal: Cardiovascular complication will  be avoided Outcome: Progressing   Problem: Activity: Goal: Risk for activity intolerance will decrease Outcome: Progressing   Problem: Nutrition: Goal: Adequate nutrition will be maintained Outcome: Progressing   Problem: Coping: Goal: Level of anxiety will decrease Outcome: Progressing   Problem: Elimination: Goal: Will not experience complications related to bowel motility Outcome: Progressing Goal: Will not experience complications related to urinary retention Outcome: Progressing   Problem: Pain Managment: Goal: General experience of comfort will improve Outcome: Progressing   Problem: Safety: Goal: Ability to remain free from injury will improve Outcome: Progressing   Problem: Skin Integrity: Goal: Risk for impaired skin integrity will decrease Outcome: Progressing   Problem: Education: Goal: Knowledge of risk factors and measures for prevention of condition will improve Outcome: Progressing   Problem: Coping: Goal: Psychosocial and spiritual needs will be supported Outcome: Progressing   Problem: Respiratory: Goal: Will maintain a patent airway Outcome: Progressing Goal: Complications related to the disease process, condition or treatment will be avoided or minimized Outcome: Progressing

## 2022-11-01 NOTE — Progress Notes (Signed)
Physical Therapy Treatment Patient Details Name: Angel Holland MRN: [id number] DOB: 1941/10/27 Today's Date: 11/01/2022   History of Present Illness 82 yo female presents to [facility] on 12/26 with rectal and abdominal pain. CT abd shows large diverticulum with superimposed diverticulitis. covid +. PMH includes IBS-D, diabetes type 2, A-fib.    PT Comments    Pt is progressing. She has a lot of concerns about going home alone; discussed getting pt resources with medical team for anxiety about being alone at home and points of contact for medical concerns. Pt is demonstrating impaired balance and due to current hospitalization, impaired balance, available assistance and home set up recommending skilled physical therapy services in HHPT setting on discharge in order to decrease risk for falls, injury and re-hospitalization. Pt demonstrates no signs/symptoms of cardiac/respiratory distress throughout session. If pt is unable to get HHPT she would benefit from OPPT to address balance deficits.     Recommendations for follow up therapy are one component of a multi-disciplinary discharge planning process, led by the attending physician.  Recommendations may be updated based on patient status, additional functional criteria and insurance authorization.  Follow Up Recommendations  Home health PT Can patient physically be transported by private vehicle: Yes   Assistance Recommended at Discharge PRN  Patient can return home with the following Help with stairs or ramp for entrance   Equipment Recommendations  None recommended by PT    Recommendations for Other Services       Precautions / Restrictions Precautions Precautions: Fall Precaution Comments: Airborne/Contact precs; bring briefs, bowel incontinence Restrictions Weight Bearing Restrictions: No     Mobility  Bed Mobility Overal bed mobility: Modified Independent Bed Mobility: Supine to Sit     Supine to sit: Independent     General  bed mobility comments: HOB flat no use of bed rails. Patient Response: Anxious, Cooperative  Transfers Overall transfer level: Independent Equipment used: None Transfers: Sit to/from Stand, Bed to chair/wheelchair/BSC Sit to Stand: Independent   Step pivot transfers: Independent       General transfer comment: pt performed sit to stand without difficulty without use of AD.    Ambulation/Gait Ambulation/Gait assistance: Independent Gait Distance (Feet): 50 Feet Assistive device: None Gait Pattern/deviations: Step-through pattern, Wide base of support   Gait velocity interpretation: 1.31 - 2.62 ft/sec, indicative of limited community ambulator   General Gait Details: Pt has very mild unsteadiness, used a SPC at home when going outside. Would benefit from continuing with this practice.        Balance Overall balance assessment: Mild deficits observed, not formally tested Sitting-balance support: No upper extremity supported Sitting balance-Leahy Scale: Normal     Standing balance support: No upper extremity supported Standing balance-Leahy Scale: Good Standing balance comment: no overt LOB with activity.          Cognition Arousal/Alertness: Awake/alert Behavior During Therapy: WFL for tasks assessed/performed Overall Cognitive Status: Within Functional Limits for tasks assessed                       Pertinent Vitals/Pain Pain Assessment Pain Assessment: No/denies pain    Home Living Family/patient expects to be discharged to:: Private residence Living Arrangements: Alone Available Help at Discharge: Available PRN/intermittently Type of Home: House Home Access: Stairs to enter   [employer] of Steps: a couple   Home Layout: One level Home Equipment: None          PT Goals (current goals can now  be found in the care plan section) Acute Rehab PT Goals Patient Stated Goal: to get up and move around more, less bowel incontinence PT  Goal Formulation: With patient Time For Goal Achievement: 11/11/22 Potential to Achieve Goals: Good Progress towards PT goals: Progressing toward goals    Frequency    Min 3X/week      PT Plan Other (comment) (updated)       AM-PAC PT "6 Clicks" Mobility   Outcome Measure  Help needed turning from your back to your side while in a flat bed without using bedrails?: None Help needed moving from lying on your back to sitting on the side of a flat bed without using bedrails?: None Help needed moving to and from a bed to a chair (including a wheelchair)?: None Help needed standing up from a chair using your arms (e.g., wheelchair or bedside chair)?: None Help needed to walk in hospital room?: A Little Help needed climbing 3-5 steps with a railing? : A Lot 6 Click Score: 21    End of Session   Activity Tolerance: Patient tolerated treatment well Patient left: in chair;with call bell/phone within reach Nurse Communication: Mobility status       Time: 4174-0814 PT Time Calculation (min) (ACUTE ONLY): 26 min  Charges:  $Gait Training: 8-22 mins $Therapeutic Activity: 8-22 mins                     Tomma Rakers, DPT, [place] Office: [phone] (Secure chat preferred)    Ander Purpura 11/01/2022, 3:49 PM

## 2022-11-01 NOTE — Plan of Care (Signed)
  Problem: Education: Goal: Ability to describe self-care measures that may prevent or decrease complications (Diabetes Survival Skills Education) will improve Outcome: Progressing Goal: Individualized Educational Video(s) Outcome: Progressing   Problem: Coping: Goal: Ability to adjust to condition or change in health will improve Outcome: Progressing   Problem: Education: Goal: Knowledge of General Education information will improve Description: Including pain rating scale, medication(s)/side effects and non-pharmacologic comfort measures Outcome: Progressing   Problem: Activity: Goal: Risk for activity intolerance will decrease Outcome: Progressing   Problem: Coping: Goal: Level of anxiety will decrease Outcome: Progressing   Problem: Pain Managment: Goal: General experience of comfort will improve Outcome: Progressing   Problem: Safety: Goal: Ability to remain free from injury will improve Outcome: Progressing

## 2022-11-01 NOTE — Discharge Instructions (Signed)
? ?  ? ?  Outpatient Psychiatry and Counseling ? ?[facility]: Mobile Crisis Management:  [phone] ? ?[facility]         ?[address] ?[place], [place] [address] ?[phone] ? ?[facility] sliding scale fee and walk in schedule: M-F 8am-12pm/1pm-3pm ?[address] ?[place], [place] [address] ?[phone] ? ?[facility] Intensive [facility]patient Therapy Program ?[address] ?[place], [place] [address] ?[phone] ? ?[facility]   [facility] ?[address]   [address] ?[place], [place] [address]    [place], [place] [address] ?[phone]     [phone] ? ?[facility]               [facility] ?[address]  [address] ?[place] [address]    [place] [address] ?[phone]     [phone] ? ?Parish McKinney, MD    [facility] ?[address]    [address] ?[place] [address]    [place] [address] ?[phone]       [phone] ? ?[facility]   [facility] ?[address]   [address] ?[place]     [place], [place] ?[phone]     [phone] ? ?[facility]    [facility] ?[address]    Shamsher Ahluwalia, MD ?[place], [place]                  [address] ?[phone]     [place], [place] [address] ?[phone] ?[facility]: (Spanish speakin) ?[phone] ? ?[facility]    [facility] ?[address]    [address] ?[place], [place] [address]    , [place] [address] ?[phone]     [phone]  ?

## 2022-11-01 NOTE — Evaluation (Signed)
Occupational Therapy Evaluation Patient Details Name: Angel Holland MRN: [id number] DOB: 03/27/1941 Today's Date: 11/01/2022   History of Present Illness 82 yo female presents to [facility] on 12/26 with rectal and abdominal pain. CT abd shows large diverticulum with superimposed diverticulitis. covid +. PMH includes IBS-D, diabetes type 2, A-fib.   Clinical Impression   Prior to this admission, patient lives alone, is fully independent, driving, and manages all her own groceries and medications. Currently, patient is perseverative on bowels and impulsive, and demonstrating upper level cognitive impairments. Patient mod A for ADLs, and total A needed for peri-care with loose stools x2. Patient declining OOB to chair or ambulation with OT due to loose stools despite having undergarments to promote increased activity tolerance. Due to prior level of function, OT recommending short term SNF to promote increased ability as patient lives alone and does not have family support.      Recommendations for follow up therapy are one component of a multi-disciplinary discharge planning process, led by the attending physician.  Recommendations may be updated based on patient status, additional functional criteria and insurance authorization.   Follow Up Recommendations  Skilled nursing-short term rehab (<3 hours/day)     Assistance Recommended at Discharge Frequent or constant Supervision/Assistance  Patient can return home with the following A little help with walking and/or transfers;A lot of help with bathing/dressing/bathroom;Assistance with cooking/housework;Direct supervision/assist for medications management;Direct supervision/assist for financial management;Assist for transportation;Help with stairs or ramp for entrance    Functional Status Assessment  Patient has had a recent decline in their functional status and demonstrates the ability to make significant improvements in function in a reasonable and  predictable amount of time.  Equipment Recommendations  Other (comment) (Defer to next venue)    Recommendations for Other Services       Precautions / Restrictions Precautions Precautions: Fall Precaution Comments: Airborne/Contact precs; bring briefs, bowel incontinence Restrictions Weight Bearing Restrictions: No      Mobility Bed Mobility Overal bed mobility: Needs Assistance Bed Mobility: Rolling, Sidelying to Sit Rolling: Supervision Sidelying to sit: Supervision, HOB elevated       General bed mobility comments: increased time and cues    Transfers Overall transfer level: Needs assistance                 General transfer comment: deferring due to loose stools x2 despite OT offering brief to promote increased activity      Balance Overall balance assessment: Mild deficits observed, not formally tested                                         ADL either performed or assessed with clinical judgement   ADL Overall ADL's : Needs assistance/impaired Eating/Feeding: Set up;Sitting   Grooming: Set up;Sitting   Upper Body Bathing: Minimal assistance;Sitting   Lower Body Bathing: Maximal assistance;Sitting/lateral leans   Upper Body Dressing : Minimal assistance;Sitting   Lower Body Dressing: Maximal assistance;Sit to/from stand;Sitting/lateral leans   Toilet Transfer: Maximal assistance Toilet Transfer Details (indicate cue type and reason): patient unable to transition to toilet or BSC x2 in session, requiring peri care from [place] and Hygiene: Total assistance;Bed level Toileting - Clothing Manipulation Details (indicate cue type and reason): patient unable to transition to toilet or BSC x2 in session, requiring peri care from OT with patient rolling     Functional mobility during  ADLs: Moderate assistance;Cueing for safety;Cueing for sequencing General ADL Comments: Patient presenting with decreased  activity tolerance, cognitive deficits, and need for increased assistance for ADLs     Vision Baseline Vision/History: 1 Wears glasses Ability to See in Adequate Light: 0 Adequate Patient Visual Report: No change from baseline       Perception     Praxis      Pertinent Vitals/Pain Pain Assessment Pain Assessment: Faces Faces Pain Scale: Hurts little more Pain Location: buttocks/peri area skin breakdown and upper back (rash on upper back) Pain Descriptors / Indicators: Sore, Discomfort, Grimacing, Moaning, Tender, Other (Comment) Pain Intervention(s): Limited activity within patient's tolerance, Monitored during session, Repositioned     Hand Dominance Right   Extremity/Trunk Assessment Upper Extremity Assessment Upper Extremity Assessment: Generalized weakness   Lower Extremity Assessment Lower Extremity Assessment: Defer to PT evaluation   Cervical / Trunk Assessment Cervical / Trunk Assessment: Normal   Communication Communication Communication: No difficulties   Cognition Arousal/Alertness: Awake/alert Behavior During Therapy: Anxious, Impulsive Overall Cognitive Status: No family/caregiver present to determine baseline cognitive functioning                                 General Comments: Patient alert and appropriate, perseverative on MD telling her that she can go home, patient usually able to take care of self, manage medications, and drives others to church, patient requiring redirection to maintain attention to task. Patient able to state orientation questions, and state her medication regimen but would benefit from and upper level cognitive task     General Comments       Exercises     Shoulder Instructions      Home Living Family/patient expects to be discharged to:: Private residence Living Arrangements: Alone Available Help at Discharge: Available PRN/intermittently Type of Home: House Home Access: Stairs to enter [employer] of Steps: a couple   Home Layout: One level         Biochemist, clinical: [place]: None          Prior Functioning/Environment Prior Level of Function : Independent/Modified Independent;Driving             Mobility Comments: pt reports driving self and others to church, usually helps others pick up groceries etc ADLs Comments: independent        OT Problem List: Decreased strength;Decreased range of motion;Decreased activity tolerance;Impaired balance (sitting and/or standing);Decreased coordination;Decreased cognition;Decreased safety awareness;Decreased knowledge of use of DME or AE;Decreased knowledge of precautions      OT Treatment/Interventions: Self-care/ADL training;Therapeutic exercise;Neuromuscular education;Energy conservation;DME and/or AE instruction;Manual therapy;Cognitive remediation/compensation;Patient/family education;Balance training;Therapeutic activities    OT Goals(Current goals can be found in the care plan section) Acute Rehab OT Goals Patient Stated Goal: to get better before I go home OT Goal Formulation: With patient Time For Goal Achievement: 11/15/22 Potential to Achieve Goals: Good ADL Goals Pt Will Perform Lower Body Bathing: Independently;sitting/lateral leans;sit to/from stand Pt Will Perform Lower Body Dressing: Independently;sit to/from stand;sitting/lateral leans Pt Will Transfer to Toilet: Independently;ambulating Pt Will Perform Toileting - Clothing Manipulation and hygiene: Independently Additional ADL Goal #1: Patient will complete upper level cognitive test/task to assess ability to live indpendently and return to PLOF safely.  OT Frequency: Min 2X/week    Co-evaluation              AM-PAC OT "6 Clicks" Daily Activity  Outcome Measure Help from another person eating meals?: A Little Help from another person taking care of personal grooming?: A Little Help from another person toileting,  which includes using toliet, bedpan, or urinal?: A Lot Help from another person bathing (including washing, rinsing, drying)?: A Lot Help from another person to put on and taking off regular upper body clothing?: A Little Help from another person to put on and taking off regular lower body clothing?: A Lot 6 Click Score: 15   End of Session Nurse Communication: Mobility status  Activity Tolerance: Patient limited by fatigue;Patient limited by lethargy Patient left: in bed;with call bell/phone within reach  OT Visit Diagnosis: Unsteadiness on feet (R26.81);Other abnormalities of gait and mobility (R26.89);Muscle weakness (generalized) (M62.81);Other symptoms and signs involving cognitive function                Time: 7209-4709 OT Time Calculation (min): 29 min Charges:  OT General Charges $OT Visit: 1 Visit OT Evaluation $OT Eval Moderate Complexity: 1 Mod OT Treatments $Self Care/Home Management : 8-22 mins  Corinne Ports E. Azalea Cedar, OTR/L Acute Rehabilitation Services [phone]   Ascencion Dike 11/01/2022, 12:23 PM

## 2022-11-01 NOTE — Evaluation (Signed)
Speech Language Pathology Evaluation Patient Details Name: Angel Holland MRN: [id number] DOB: 12-10-1940 Today's Date: 11/01/2022 Time:  -     Problem List:  Patient Active Problem List   Diagnosis Date Noted   Acute diverticulitis 10/25/2022   Rectal pain 10/25/2022   Constipation 10/25/2022   Atrial fibrillation, chronic ([place]) 11/91/4782   Acute diastolic heart failure ([place]) 07/28/2022   Anxiety 07/28/2022   Hyperlipidemia associated with type 2 diabetes mellitus ([place]) 01/26/2022   History of basal cell cancer 01/26/2022   Rosacea 01/26/2022   Statin intolerance 01/26/2022   Low back pain 03/05/2019   PAF (paroxysmal atrial fibrillation) ([place]) 11/26/2018   Aortic atherosclerosis ([place]) 07/17/2017   Diabetes mellitus type II, controlled ([place]) 07/12/2015   History of IBS - followed by [facility] 03/30/2014   Past Medical History:  Past Medical History:  Diagnosis Date   Anxiety    no meds   Arthritis    back and fingers    Bruxism    Cataract    baby cataracts per pt.   Colon polyps    DDD (degenerative disc disease), lumbar    DDD (degenerative disc disease), lumbosacral    Depression    no meds   Diverticulosis of colon (without mention of hemorrhage)    Fibroid uterus    Headache, paroxysmal hemicrania, episodic 03/24/2014   Hemorrhoids    History of D&C    x2   History of night sweats    History of rectal bleeding    08/2017   Hx of diverticulitis of colon 03/30/2014   Hyperglycemia    Irritable bowel syndrome    PAF (paroxysmal atrial fibrillation) ([place]) 01/2018   pt declined [facility]   Pulmonary nodules - reports followed by pulmonology, reports following up with them in June 2015 03/26/2013   Repeat CT chest 04/20/14 > 1. Scattered pulmonary nodules measure 4 mm or less in size, are unchanged from 03/28/2013 and are therefore considered benign     Rosacea    Tobacco abuse    Unspecified hemorrhoids without mention of complication    Past Surgical History:   Past Surgical History:  Procedure Laterality Date   APPENDECTOMY  1957   bce     CATARACT EXTRACTION     Left June 2021-Right 05/2020   CATARACT EXTRACTION, BILATERAL     CHOLECYSTECTOMY  2009   DILATION AND CURETTAGE OF UTERUS     fibroid removed from uterus     HYSTEROSCOPY WITH D & C N/A 09/09/2015   Procedure: DILATATION AND CURETTAGE /HYSTEROSCOPY;  Surgeon: Vanessa Kick, MD;  Location: [facility];  Service: Gynecology;  Laterality: N/A;   HPI:  82 yo female presents to [facility] on 12/26 with rectal and abdominal pain. CT abd shows large diverticulum with superimposed diverticulitis. covid +. PMH includes IBS-D, diabetes type 2, A-fib.   Assessment / Plan / Recommendation Clinical Impression  Pt demonstrates no cognitive impairment on assessment. She completed the SLUMS exam, scoring 25/30 which is WNL (points lost were primarily memory related). Pt able to verbalize a good synopsis of recent events and was appropriate and on topic throughout. Verbal reasoning is adequate, though PT/OT assessments are a better judge of reasoning with mobility and function. She does seem to suffer from chronic bathroom anxiety as she has struggled with public incontinence for many years. This hospitalization seems to have exacerbated this, particularly related to her lack of control of her own mobilty and self care. Would not recommend f/u dedicated  to cognition. Will sign off.    SLP Assessment  SLP Recommendation/Assessment: Patient needs continued Speech [place] [facility] SLP Visit Diagnosis: Cognitive communication deficit (R41.841)    Recommendations for follow up therapy are one component of a multi-disciplinary discharge planning process, led by the attending physician.  Recommendations may be updated based on patient status, additional functional criteria and insurance authorization.    Follow Up Recommendations  No SLP follow up    Assistance Recommended at Discharge     Functional  Status Assessment    Frequency and Duration           SLP Evaluation Cognition  Overall Cognitive Status: Within Functional Limits for tasks assessed       Comprehension  Auditory Comprehension Overall Auditory Comprehension: Appears within functional limits for tasks assessed    Expression Verbal Expression Overall Verbal Expression: Appears within functional limits for tasks assessed Written Expression Dominant Hand: Right   Oral / Motor  Motor Speech Overall Motor Speech: Appears within functional limits for tasks assessed            Jakyri Brunkhorst, Katherene Ponto 11/01/2022, 2:29 PM

## 2022-11-01 NOTE — Progress Notes (Addendum)
PROGRESS NOTE  Angel Holland TIW:[id number] DOB: 02-14-1941 DOA: 10/24/2022 PCP: Charyl Dancer, NP   LOS: 7 days   Brief Narrative / Interim history: 82 year old with past medical history significant for IBS-D, diabetes type 2, A-fib presented to the ED complaining of rectal pain. She has been taking fiber pills for more than 20 years, she usually have diarrhea. After she discontinue fiber pills she developed constipation. Patient took some Imodium, to avoid having diarrhea at a family house. Last bowel movement 4 days prior to admission. She developed the day prior to admission severe rectal pain lasted for 6 hours. Evaluation in the ED she was found to have mild leukocytosis, CT scan showed large diverticulum with superimposed diverticulitis.   Subjective / 24h Interval events: Complains of persistent diarrhea this morning, appreciates no improvement whatsoever  Assesement and Plan: Principal Problem:   Acute diverticulitis Active Problems:   Diabetes mellitus type II, controlled ([place])   Rectal pain   Constipation   Atrial fibrillation, chronic ([place])   Principal problem Acute Diverticulitis -GI consulted, evaluated patient, she is now status post 7 days of antibiotics with cefepime and Flagyl.  She reports improvement in her abdominal pain.  Florastor was started.  Active problems Diarrhea -likely multifactorial in the setting of recent antibiotic use, Covid, also IBS.  Increase colestipol today, add dicyclomine.  Discussed with GI over the phone  Rectal Pain - Suspect related to fecal impaction.  Resolved after enema   Chronic A fib -On Cardizem and baby aspirin.   DM type 2 -Continue with a sliding scale insulin  CBG (last 3)  Recent Labs    10/31/22 2048 11/01/22 0745 11/01/22 1219  GLUCAP 105* 150* 114*   Hypokalemia - Replaced oral.  Normalized this morning   Endometrial fluid attenuation - she will need follow up with GYN. Patient and family aware.   Covid  viral illness -completed Paxlovid today.  No significant respiratory symptoms   Rash back; suspect contact dermatitis - Start Triamcinolone Eucerin.    Scheduled Meds:  ascorbic acid  500 mg Oral Daily   aspirin EC  81 mg Oral See admin instructions   Chlorhexidine Gluconate Cloth  6 each Topical Daily   colestipol  1 g Oral BID   dicyclomine  10 mg Oral TID AC   diltiazem  120 mg Oral Daily   enoxaparin (LOVENOX) injection  40 mg Subcutaneous Q24H   fluticasone  1 spray Each Nare Daily   Gerhardt's butt cream   Topical TID   insulin aspart  0-9 Units Subcutaneous TID WC   loratadine  10 mg Oral Daily   nirmatrelvir/ritonavir  3 tablet Oral BID   nystatin   Topical TID   polycarbophil  625 mg Oral Daily   psyllium  1 packet Oral BID   saccharomyces boulardii  250 mg Oral BID   triamcinolone 0.1 % cream : eucerin   Topical TID   Continuous Infusions: PRN Meds:.guaiFENesin, guaiFENesin-dextromethorphan, hydrocortisone cream, HYDROmorphone (DILAUDID) injection, ibuprofen, ondansetron (ZOFRAN) IV, oxyCODONE, sodium chloride, traMADol  Current Outpatient Medications  Medication Instructions   AMBULATORY NON FORMULARY MEDICATION 2 capsules, Oral, Daily, Medication Name: Osteo Matrix-[place]   Ascorbic Acid (VITAMIN C PO) 1,500 mg, Oral, 2 times daily with meals   aspirin EC 81 mg, Oral, See admin instructions, Take one tablet (81 mg) by mouth every morning, may also take one tablet (81 mg) at night as needed for pain/sleep    Cyanocobalamin (VITAMIN B-12 PO) 130 mg, Oral,  Daily with supper   dicyclomine (BENTYL) 10 MG capsule TAKE 1 CAPSULE BY MOUTH FOUR TIMES DAILY BEFORE MEALS AND AT BEDTIME   diltiazem (CARDIZEM CD) 120 mg, Oral, Daily   fluconazole (DIFLUCAN) 150 MG tablet Take 1 tablet after finishing your antibiotic, you can take a second one in 3 days if needed   hydrocodone-ibuprofen (VICOPROFEN) 5-200 MG tablet 1 tablet, Oral, Daily PRN   loperamide (IMODIUM) 2 mg, Oral, Daily  PRN   metroNIDAZOLE (METROGEL) 1.94 % gel 1 application , Topical, Every morning, Applies to face for rosacea   Multiple Vitamins-Minerals (OCUVITE PO) 1 tablet, Oral, Daily at bedtime   triamcinolone (NASACORT) 55 MCG/ACT AERO nasal inhaler 2 sprays, Nasal, Daily PRN   VITAMIN D PO 1,000 Units, Oral, Daily with breakfast   Wheat Dextrin (BENEFIBER DRINK MIX PO) 15 mLs, 2 times daily    Diet Orders (From admission, onward)     Start     Ordered   10/25/22 0627  Diet heart healthy/carb modified Room service appropriate? Yes; Fluid consistency: Thin  Diet effective now       Question Answer Comment  Diet-HS Snack? Nothing   Room service appropriate? Yes   Fluid consistency: Thin      10/25/22 0626            DVT prophylaxis: enoxaparin (LOVENOX) injection 40 mg Start: 10/25/22 1400 SCDs Start: 10/25/22 1740   Lab Results  Component Value Date   PLT 252 11/01/2022      Code Status: Full Code  Family Communication: no family at bedside   Status is: Inpatient  Remains inpatient appropriate because: Persistent symptoms   Level of care: Med-Surg  Consultants:  GI  Objective: Vitals:   10/31/22 1655 10/31/22 2040 11/01/22 0441 11/01/22 0747  BP: 126/63 125/69 119/68 116/62  Pulse: 78 73 71 76  Resp: '17 17 18 18  '$ Temp: 98.6 F (37 C) 98.4 F (36.9 C) 98.6 F (37 C) 98.6 F (37 C)  TempSrc: Oral Oral Oral Oral  SpO2: 94% 94% 93% 97%  Weight:      Height:        Intake/Output Summary (Last 24 hours) at 11/01/2022 1409 Last data filed at 11/01/2022 0845 Gross per 24 hour  Intake 180 ml  Output 2600 ml  Net -2420 ml   Wt Readings from Last 3 Encounters:  10/28/22 56.7 kg  07/28/22 56.7 kg  04/28/22 58.7 kg    Examination:  Constitutional: NAD Eyes: no scleral icterus ENMT: Mucous membranes are moist.  Neck: normal, supple Respiratory: clear to auscultation bilaterally, no wheezing, no crackles. Normal respiratory effort. No accessory muscle use.   Cardiovascular: Regular rate and rhythm, no murmurs / rubs / gallops.  Abdomen: non distended, no tenderness. Bowel sounds positive.  Musculoskeletal: no clubbing / cyanosis.   Data Reviewed: I have independently reviewed following labs and imaging studies   CBC Recent Labs  Lab 10/27/22 1128 10/28/22 0831 10/29/22 0206 10/30/22 0614 11/01/22 0221  WBC 7.1 5.7 4.6 4.7 7.9  HGB 12.2 12.3 11.7* 12.4 12.2  HCT 36.6 37.5 36.2 39.3 37.5  PLT 191 224 232 222 252  MCV 85.9 87.0 87.4 88.9 87.8  MCH 28.6 28.5 28.3 28.1 28.6  MCHC 33.3 32.8 32.3 31.6 32.5  RDW 13.6 13.7 13.9 14.3 14.2    Recent Labs  Lab 10/27/22 1128 10/28/22 0229 10/28/22 0831 10/29/22 0206 10/30/22 0614 10/31/22 0323 10/31/22 0746 11/01/22 0221  NA 134*  --  138 139 137  --  134* 138  K 3.6  --  3.6 2.8* 4.7  --  4.6 3.9  CL 107  --  111 107 106  --  104 103  CO2 18*  --  19* 22 21*  --  21* 23  GLUCOSE 111*  --  117* 184* 115*  --  111* 118*  BUN 8  --  7* 8 8  --  9 8  CREATININE 0.58  --  0.48 0.75 0.64  --  0.73 0.68  CALCIUM 8.5*  --  8.4* 8.4* 8.8*  --  9.0 9.1  MG 2.2  --  2.3 2.2  --   --   --   --   CRP  --  2.2*  --  1.0* 0.5 <0.5  --  0.5    ------------------------------------------------------------------------------------------------------------------ No results for input(s): "CHOL", "HDL", "LDLCALC", "TRIG", "CHOLHDL", "LDLDIRECT" in the last 72 hours.  Lab Results  Component Value Date   HGBA1C 6.6 (H) 07/28/2022   ------------------------------------------------------------------------------------------------------------------ No results for input(s): "TSH", "T4TOTAL", "T3FREE", "THYROIDAB" in the last 72 hours.  Invalid input(s): "FREET3"  Cardiac Enzymes No results for input(s): "CKMB", "TROPONINI", "MYOGLOBIN" in the last 168 hours.  Invalid input(s): "CK" ------------------------------------------------------------------------------------------------------------------ No  results found for: "BNP"  CBG: Recent Labs  Lab 10/31/22 1212 10/31/22 1657 10/31/22 2048 11/01/22 0745 11/01/22 1219  GLUCAP 118* 174* 105* 150* 114*    Recent Results (from the past 240 hour(s))  Urine Culture     Status: None   Collection Time: 10/25/22 12:55 AM   Specimen: Urine, Catheterized  Result Value Ref Range Status   Specimen Description URINE, CATHETERIZED  Final   Special Requests NONE  Final   Culture   Final    NO GROWTH Performed at [facility], [place] [address] [place], [place] [address]    Report Status 10/26/2022 FINAL  Final  Resp panel by RT-PCR (RSV, Flu A&B, Covid) Anterior Nasal Swab     Status: Abnormal   Collection Time: 10/27/22 11:08 AM   Specimen: Anterior Nasal Swab  Result Value Ref Range Status   SARS Coronavirus 2 by RT PCR POSITIVE (A) NEGATIVE Final    Comment: (NOTE) SARS-CoV-2 target nucleic acids are DETECTED.  The SARS-CoV-2 RNA is generally detectable in upper respiratory specimens during the acute phase of infection. Positive results are indicative of the presence of the identified virus, but do not rule out bacterial infection or co-infection with other pathogens not detected by the test. Clinical correlation with patient history and other diagnostic information is necessary to determine patient infection status. The expected result is Negative.  Fact Sheet for Patients: EntrepreneurPulse.com.au  Fact Sheet for Healthcare Providers: IncredibleEmployment.be  This test is not yet approved or cleared by the [place] FDA and  has been authorized for detection and/or diagnosis of SARS-CoV-2 by FDA under an Emergency Use Authorization (EUA).  This EUA will remain in effect (meaning this test can be used) for the duration of  the COVID-19 declaration under Section 564(b)(1) of the A ct, 21 U.S.C. section 360bbb-3(b)(1), unless the authorization is terminated or revoked sooner.      Influenza A by PCR NEGATIVE NEGATIVE Final   Influenza B by PCR NEGATIVE NEGATIVE Final    Comment: (NOTE) The Xpert Xpress SARS-CoV-2/FLU/RSV plus assay is intended as an aid in the diagnosis of influenza from Nasopharyngeal swab specimens and should not be used as a sole basis for treatment. Nasal washings and aspirates are unacceptable for Xpert Xpress  SARS-CoV-2/FLU/RSV testing.  Fact Sheet for Patients: EntrepreneurPulse.com.au  Fact Sheet for Healthcare Providers: IncredibleEmployment.be  This test is not yet approved or cleared by the [place] FDA and has been authorized for detection and/or diagnosis of SARS-CoV-2 by FDA under an Emergency Use Authorization (EUA). This EUA will remain in effect (meaning this test can be used) for the duration of the COVID-19 declaration under Section 564(b)(1) of the Act, 21 U.S.C. section 360bbb-3(b)(1), unless the authorization is terminated or revoked.     Resp Syncytial Virus by PCR NEGATIVE NEGATIVE Final    Comment: (NOTE) Fact Sheet for Patients: EntrepreneurPulse.com.au  Fact Sheet for Healthcare Providers: IncredibleEmployment.be  This test is not yet approved or cleared by the [place] FDA and has been authorized for detection and/or diagnosis of SARS-CoV-2 by FDA under an Emergency Use Authorization (EUA). This EUA will remain in effect (meaning this test can be used) for the duration of the COVID-19 declaration under Section 564(b)(1) of the Act, 21 U.S.C. section 360bbb-3(b)(1), unless the authorization is terminated or revoked.  Performed at [facility], [place] [address] [place], [place] [address]      Radiology Studies: No results found.   Marzetta Board, MD, PhD [facility]  Between 7 am - 7 pm I am available, please contact me via Amion (for emergencies) or Securechat (non urgent messages)  Between 7 pm - 7 am I  am not available, please contact night coverage MD/APP via Amion

## 2022-11-01 NOTE — Progress Notes (Signed)
Mobility Specialist - Progress Note   11/01/22 1614  Mobility  Activity Ambulated independently in hallway  Level of Assistance Modified independent, requires aide device or extra time  Assistive Device None  Distance Ambulated (ft) 750 ft  Activity Response Tolerated well  Mobility Referral Yes  $Mobility charge 1 Mobility    Pt received in room agreeable to mobility. Tolerated distance well, no complaints throughout. Left sitting EOB w/ call bell in reach and all needs met.   [place] Specialist Please contact via SecureChat or Rehab office at [phone]

## 2022-11-01 NOTE — Plan of Care (Signed)
  Problem: Education: Goal: Ability to describe self-care measures that may prevent or decrease complications (Diabetes Survival Skills Education) will improve Outcome: Progressing   Problem: Coping: Goal: Ability to adjust to condition or change in health will improve Outcome: Progressing   Problem: Skin Integrity: Goal: Risk for impaired skin integrity will decrease Outcome: Progressing   Problem: Education: Goal: Knowledge of General Education information will improve Description: Including pain rating scale, medication(s)/side effects and non-pharmacologic comfort measures Outcome: Progressing   Problem: Coping: Goal: Level of anxiety will decrease Outcome: Progressing   Problem: Pain Managment: Goal: General experience of comfort will improve Outcome: Progressing   Problem: Safety: Goal: Ability to remain free from injury will improve Outcome: Progressing   Problem: Respiratory: Goal: Will maintain a patent airway Outcome: Progressing Goal: Complications related to the disease process, condition or treatment will be avoided or minimized Outcome: Progressing

## 2022-11-01 NOTE — Progress Notes (Signed)
Progress Note  Primary GI: Dr. Fuller Plan   Subjective  Chief Complaint: Fecal incontinence, in setting of recently treated diverticulitis treated on IBS-D  Patient states she has no AB pain, no nausea, vomiting or fever.  No Bm's last night, had 2 this AM while trying to make it to the bathroom.  She is walking around and feeling better.  Continues to have vaginal itching.    Objective   Vital signs in last 24 hours: Temp:  [98.4 F (36.9 C)-98.6 F (37 C)] 98.6 F (37 C) (01/03 0747) Pulse Rate:  [71-78] 76 (01/03 0747) Resp:  [17-18] 18 (01/03 0747) BP: (116-126)/(62-69) 116/62 (01/03 0747) SpO2:  [93 %-97 %] 97 % (01/03 0747) Last BM Date : 11/01/22 Last BM recorded by nurses in past 5 days Stool Type: Type 5 (Soft blobs with clear-cut edges) (10/30/2022 10:00 PM)  General:   female in no acute distress  Heart:  Regular rate and rhythm; no murmurs Pulm: Clear anteriorly; no wheezing Abdomen:  Soft, Obese AB, Active bowel sounds. No tenderness . Without guarding and Without rebound, No organomegaly appreciated. Rectal: not done Extremities:  without  edema.  Erythematous macular rash on back. . Neurologic:  Alert and  oriented x4;  No focal deficits.  Psych:  Cooperative. Normal mood and affect.  Intake/Output from previous day: 01/02 0701 - 01/03 0700 In: 240 [P.O.:240] Out: 3250 [Urine:3250] Intake/Output this shift: Total I/O In: 180 [P.O.:180] Out: 450 [Urine:450]  Studies/Results: No results found.  Lab Results: Recent Labs    10/30/22 0614 11/01/22 0221  WBC 4.7 7.9  HGB 12.4 12.2  HCT 39.3 37.5  PLT 222 252   BMET Recent Labs    10/30/22 0614 10/31/22 0746 11/01/22 0221  NA 137 134* 138  K 4.7 4.6 3.9  CL 106 104 103  CO2 21* 21* 23  GLUCOSE 115* 111* 118*  BUN '8 9 8  '$ CREATININE 0.64 0.73 0.68  CALCIUM 8.8* 9.0 9.1   LFT No results for input(s): "PROT", "ALBUMIN", "AST", "ALT", "ALKPHOS", "BILITOT", "BILIDIR", "IBILI" in the last 72  hours. PT/INR No results for input(s): "LABPROT", "INR" in the last 72 hours.   Scheduled Meds:  ascorbic acid  500 mg Oral Daily   aspirin EC  81 mg Oral See admin instructions   Chlorhexidine Gluconate Cloth  6 each Topical Daily   colestipol  1 g Oral BID   dicyclomine  10 mg Oral TID AC   diltiazem  120 mg Oral Daily   enoxaparin (LOVENOX) injection  40 mg Subcutaneous Q24H   fluticasone  1 spray Each Nare Daily   Gerhardt's butt cream   Topical TID   insulin aspart  0-9 Units Subcutaneous TID WC   loratadine  10 mg Oral Daily   nirmatrelvir/ritonavir  3 tablet Oral BID   nystatin   Topical TID   polycarbophil  625 mg Oral Daily   psyllium  1 packet Oral BID   saccharomyces boulardii  250 mg Oral BID   triamcinolone 0.1 % cream : eucerin   Topical TID   Continuous Infusions:     Impression/Plan:   Fecal incontinence after receiving antibiotics for diverticulitis, and MiraLAX prep for constipation CT abdomen pelvis showed well-defined collection fecal material adjacent colon inflammatory changes possible diverticulitis/stercoral colitis/rectal prolapse Treated with Flagyl and cefepime has 1 more day No further abdominal pain, no fevers no chills.   She has only had 2 accidents this AM, responding well to the colestipol  and metamucil Continue this outpatient, monitor for constipation with colestipol May benefit from pelvic floor referral outpatient Has follow-up appointment in our office 11/15/2022 with Dr Fuller Plan, encourage patient keep appointment  Rectal irritation Continue Desitin and protective ointment.  Diverticulitis Has finished ABX No more abdominal discomfort 11/2017 colonoscopy for hematochezia and positive Cologuard and irregular appearance of sigmoid on CT scan showed left-sided severe diverticulosis associated with with colonic narrowing, spasm peridiverticular erythema, no bleeding nonbleeding internal hemorrhoids random biopsies no inflammation, increased  lymphocytes or subfoveal collagen thickening. Has follow up with Dr. Fuller Plan  10/27/2022 positive COVID On Paxlovid No shortness of breath or chest pain.   Principal Problem:   Acute diverticulitis Active Problems:   Diabetes mellitus type II, controlled ([place])   Rectal pain   Constipation   Atrial fibrillation, chronic ([place])    LOS: 7 days   Vladimir Crofts  11/01/2022, 1:53 PM

## 2022-11-01 NOTE — TOC Progression Note (Addendum)
Transition of Care [facility]) - Progression Note    Patient Details  Name: Angel Holland MRN: [id number] Date of Birth: 1940/12/07  [facility] CM/SW [place], [place] Phone Number: 11/01/2022, 2:00 PM  Clinical Narrative:     CSW consulted medical and therapy teams to determine disposition for pt. Pt previously walked 650 ft with no assist, cleared by PT. Most recent PT note stating pt had a decline to 200 ft and needing help with personal care and ADL's. OT saw pt today and noted pt required a lot of help with care. Per nursing staff and medical team, pt has been ambulating independently in room and caring for herself. She has done so today as well. Medical team currently working to manage pt's symptoms, but pt seems to be self limiting with therapies. At PT request, Mental Health/ Counseling resources were added to AVS. TOC will continue to follow for updates and appropriate disposition.   Expected Discharge Plan: Home/Self Care Barriers to Discharge: Continued Medical Work up  Expected Discharge Plan and Services       Living arrangements for the past 2 months: Single Family Home                   DME Agency: NA       HH Arranged: NA           Social Determinants of Health (SDOH) Interventions SDOH Screenings   Food Insecurity: No Food Insecurity (12/05/2021)  Housing: Low Risk  (12/05/2021)  Transportation Needs: No Transportation Needs (12/05/2021)  Alcohol Screen: Low Risk  (12/05/2021)  Depression (PHQ2-9): Low Risk  (12/05/2021)  Financial Resource Strain: Low Risk  (12/05/2021)  Physical Activity: Sufficiently Active (12/05/2021)  Social Connections: Moderately Integrated (12/05/2021)  Stress: No Stress Concern Present (12/05/2021)  Tobacco Use: Medium Risk (10/24/2022)    Readmission Risk Interventions     No data to display

## 2022-11-02 DIAGNOSIS — K5792 Diverticulitis of intestine, part unspecified, without perforation or abscess without bleeding: Secondary | ICD-10-CM | POA: Diagnosis not present

## 2022-11-02 LAB — GLUCOSE, CAPILLARY: Glucose-Capillary: 196 mg/dL — ABNORMAL HIGH (ref 70–99)

## 2022-11-02 LAB — COMPREHENSIVE METABOLIC PANEL
ALT: 28 U/L (ref 0–44)
AST: 26 U/L (ref 15–41)
Albumin: 3.6 g/dL (ref 3.5–5.0)
Alkaline Phosphatase: 47 U/L (ref 38–126)
Anion gap: 9 (ref 5–15)
BUN: 8 mg/dL (ref 8–23)
CO2: 24 mmol/L (ref 22–32)
Calcium: 9.2 mg/dL (ref 8.9–10.3)
Chloride: 104 mmol/L (ref 98–111)
Creatinine, Ser: 0.68 mg/dL (ref 0.44–1.00)
GFR, Estimated: >60 mL/min (ref >60)
Glucose, Bld: 124 mg/dL — ABNORMAL HIGH (ref 70–99)
Potassium: 3.7 mmol/L (ref 3.5–5.1)
Sodium: 137 mmol/L (ref 135–145)
Total Bilirubin: 0.3 mg/dL (ref 0.3–1.2)
Total Protein: 6.4 g/dL — ABNORMAL LOW (ref 6.5–8.1)

## 2022-11-02 LAB — CBC
HCT: 39.6 % (ref 36.0–46.0)
Hemoglobin: 12.6 g/dL (ref 12.0–15.0)
MCH: 28.2 pg (ref 26.0–34.0)
MCHC: 31.8 g/dL (ref 30.0–36.0)
MCV: 88.6 fL (ref 80.0–100.0)
Platelets: 307 10*3/uL (ref 150–400)
RBC: 4.47 MIL/uL (ref 3.87–5.11)
RDW: 14.2 % (ref 11.5–15.5)
WBC: 8.3 10*3/uL (ref 4.0–10.5)
nRBC: 0 % (ref 0.0–0.2)

## 2022-11-02 MED ORDER — TRIAMCINOLONE 0.1 % CREAM:EUCERIN CREAM 1:1: 1.0000 | TOPICAL_CREAM | Freq: Two times a day (BID) | CUTANEOUS | 0 refills | Status: AC

## 2022-11-02 MED ORDER — COLESTIPOL HCL 1 G PO TABS: 1.0000 g | ORAL_TABLET | Freq: Two times a day (BID) | ORAL | 0 refills | Status: DC

## 2022-11-02 NOTE — Progress Notes (Cosign Needed)
Pt requested pain medication. While flushing IV line it became red and painful. Nurse stopped flush and removed IV and did NOT give Dilauid 0.'5mg'$  pulled from pyxis. Per pharmacy nurse it to waste medication with charge nurse singed off

## 2022-11-02 NOTE — Discharge Summary (Signed)
Physician Discharge Summary  [name] RKY:[id number] DOB: [birth year] DOA: 10/24/2022  PCP: Charyl Dancer, NP  Admit date: 10/24/2022 Discharge date: 11/02/2022  Admitted From: home Disposition:  home  Recommendations for Outpatient Follow-up:  Follow up with PCP in 1-2 weeks  Home Health: PT Equipment/Devices: none  Discharge Condition: stable CODE STATUS: Full code  HPI: Per admitting MD, Angel Holland is a 82 y.o. female with medical history significant of IBS-D, T2DM, atrial fibrillation who presents to the emergency department due to rectal pain.  Patient states that she has been taking fiber pills for more than 20 years and this has resulted in her having diarrhea.  Her son told her that the fiber pills was possibly because of her chronic diarrhea, so she stopped taking it and shortly after this, she started to have constipation.  Due to need to spend time with some family members during the Christmas and did not want to have to use the bathroom at the family's house, she took 2 Imodium tablets.  Last bowel movement was about 4 days ago.  Yesterday, she developed a severe rectal pain while trying to have a bowel movement, and the pain has been persistent for about 6 hours, associated to go to the ED for further evaluation and management.   Hospital Course / Discharge diagnoses: Principal Problem:   Acute diverticulitis Active Problems:   Diabetes mellitus type II, controlled ([place])   Rectal pain   Constipation   Atrial fibrillation, chronic (HCC)   Diarrhea   Principal problem Acute Diverticulitis -GI consulted, evaluated patient, she is now status post 7 days of antibiotics with cefepime and Flagyl.  She reports improvement in her abdominal pain, now tolerating a regular diet.   Active problems Diarrhea -likely multifactorial in the setting of recent antibiotic use, Covid, also IBS.  She was placed on colestipol, dicyclomine was added, and now improved and she has  returned to baseline.  Rectal Pain - Suspect related to fecal impaction.  Resolved after enema Chronic A fib -On Cardizem and baby aspirin. DM type 2 -Continue home medications Hypokalemia -improved after repletion Endometrial fluid attenuation - she will need follow up with GYN. Patient and family aware.  Covid viral illness -completed Paxlovid.  No significant respiratory symptoms Rash back; suspect contact dermatitis - Start Triamcinolone Eucerin.   Sepsis ruled out   Discharge Instructions   Allergies as of 11/02/2022       Reactions   Biaxin [clarithromycin] Other (See Comments)   BREASTS INFLAMED   Codeine Other (See Comments)   PANIC ATTACKS, CAN'T SLEEP   Decongestant [pseudoephedrine Hcl Er] Other (See Comments)   Cannot take with Dilitiazem   Phenylephrine Other (See Comments)   Cannot take with Dilitiazem   Rosuvastatin Nausea And Vomiting, Other (See Comments)   Acetaminophen Other (See Comments)   Triggered her "heart problem". Referring to atrial fibrillation        Medication List     TAKE these medications    AMBULATORY NON FORMULARY MEDICATION Take 2 capsules by mouth daily. Medication Name: Leverne Humbles   aspirin EC 81 MG tablet Take 81 mg by mouth See admin instructions. Take one tablet (81 mg) by mouth every morning, may also take one tablet (81 mg) at night as needed for pain/sleep   BENEFIBER DRINK MIX PO Take 15 mLs by mouth 2 (two) times daily.   colestipol 1 g tablet Commonly known as: COLESTID Take 1 tablet (1 g total) by mouth  2 (two) times daily.   dicyclomine 10 MG capsule Commonly known as: BENTYL TAKE 1 CAPSULE BY MOUTH FOUR TIMES DAILY BEFORE MEALS AND AT BEDTIME What changed:  how much to take how to take this when to take this additional instructions   diltiazem 120 MG 24 hr capsule Commonly known as: CARDIZEM CD Take 1 capsule (120 mg total) by mouth daily.   fluconazole 150 MG tablet Commonly known as:  DIFLUCAN Take 1 tablet after finishing your antibiotic, you can take a second one in 3 days if needed   hydrocodone-ibuprofen 5-200 MG tablet Commonly known as: VICOPROFEN Take 1 tablet by mouth daily as needed for pain. What changed:  when to take this reasons to take this   loperamide 2 MG capsule Commonly known as: IMODIUM Take 2 mg by mouth daily as needed for diarrhea or loose stools.   metroNIDAZOLE 0.75 % gel Commonly known as: METROGEL Apply 1 application topically every morning. Applies to face for rosacea   OCUVITE PO Take 1 tablet by mouth at bedtime.   triamcinolone 0.1 % cream : eucerin Crea Apply 1 Application topically 2 (two) times daily for 5 days. Back itchy areas   triamcinolone 55 MCG/ACT Aero nasal inhaler Commonly known as: NASACORT Place 2 sprays into the nose daily as needed (congestion/sinus headache).   VITAMIN B-12 PO Take 130 mg by mouth daily with supper.   VITAMIN C PO Take 1,500 mg by mouth 2 (two) times daily with a meal.   VITAMIN D PO Take 1,000 Units by mouth daily with breakfast.        Follow-up Information     Ladene Artist, MD Follow up on 11/15/2022.   Specialty: Gastroenterology Why: 3pm Contact information: [address] [place] [address] [phone]                 Consultations: GI  Procedures/Studies:  CT ABDOMEN PELVIS W CONTRAST  Result Date: 10/24/2022 CLINICAL DATA:  Acute abdominal pain EXAM: CT ABDOMEN AND PELVIS WITH CONTRAST TECHNIQUE: Multidetector CT imaging of the abdomen and pelvis was performed using the standard protocol following bolus administration of intravenous contrast. RADIATION DOSE REDUCTION: This exam was performed according to the departmental dose-optimization program which includes automated exposure control, adjustment of the mA and/or kV according to patient size and/or use of iterative reconstruction technique. CONTRAST:  54m OMNIPAQUE IOHEXOL 350 MG/ML SOLN  COMPARISON:  12/14/2017 FINDINGS: Lower chest: Patchy basilar atelectasis is noted. No sizable effusion is seen. Hepatobiliary: Fatty infiltration of the liver is noted. The gallbladder has been surgically removed. Pancreas: Unremarkable. No pancreatic ductal dilatation or surrounding inflammatory changes. Spleen: Normal in size without focal abnormality. Adrenals/Urinary Tract: Adrenal glands are within normal limits. Kidneys demonstrate prominent extrarenal pelves without obstructive change. No calculi are noted. The bladder is well distended. Stomach/Bowel: Findings suspicious for rectal prolapse are noted. Diverticulosis is seen. There is a large pericolonic collection of fecal material which may represent a large diverticulum or possibly pericolonic abscess. This is larger than that noted in 2019 and currently measures approximately 4.7 cm in greatest dimension. Pericolonic inflammatory change consistent with diverticulitis is noted. The appendix has been surgically removed. Small bowel and stomach are within normal limits. Vascular/Lymphatic: Aortic atherosclerosis. No enlarged abdominal or pelvic lymph nodes. Reproductive: Fluid attenuation is noted within the endometrial cavity of uncertain significance. This is somewhat suspicious given the patient's age. No adnexal mass is noted. Other: No abdominal wall hernia or abnormality. No abdominopelvic ascites.  Musculoskeletal: No acute or significant osseous findings. IMPRESSION: Well-defined collection of fecal material adjacent to the colon with surrounding inflammatory change. This likely represents a large diverticulum given its chronic nature with superimposed diverticulitis on the current exam. Fluid attenuation within the endometrial canal somewhat suspicious given the patient's age. Further workup is necessary. Findings suggestive of rectal prolapse Fatty liver. Electronically Signed   By: Inez Catalina M.D.   On: 10/24/2022 21:56   DG Abdomen 1  View  Result Date: 10/24/2022 CLINICAL DATA:  Constipation EXAM: ABDOMEN - 1 VIEW COMPARISON:  None Available. FINDINGS: Moderate fecal loading in the colon. No bowel obstruction. An oval collection of air in the mid abdomen is likely in the stomach. No other bony or soft tissue abnormalities are identified. IMPRESSION: Moderate fecal loading in the colon. No other abnormalities. Electronically Signed   By: Dorise Bullion III M.D.   On: 10/24/2022 14:54     Subjective: - no chest pain, shortness of breath, no abdominal pain, nausea or vomiting.   Discharge Exam: BP (!) 103/55 (BP Location: Right Arm)   Pulse 74   Temp 98.1 F (36.7 C) (Oral)   Resp 17   Ht '4\' 10"'$  (1.473 m)   Wt 56.7 kg   SpO2 99%   BMI 26.13 kg/m   General: Pt is alert, awake, not in acute distress Cardiovascular: RRR, S1/S2 +, no rubs, no gallops Respiratory: CTA bilaterally, no wheezing, no rhonchi Abdominal: Soft, NT, ND, bowel sounds + Extremities: no edema, no cyanosis    The results of significant diagnostics from this hospitalization (including imaging, microbiology, ancillary and laboratory) are listed below for reference.     Microbiology: Recent Results (from the past 240 hour(s))  Urine Culture     Status: None   Collection Time: 10/25/22 12:55 AM   Specimen: Urine, Catheterized  Result Value Ref Range Status   Specimen Description URINE, CATHETERIZED  Final   Special Requests NONE  Final   Culture   Final    NO GROWTH Performed at [facility], [address] [place], [place] [address]    Report Status 10/26/2022 FINAL  Final  Resp panel by RT-PCR (RSV, Flu A&B, Covid) Anterior Nasal Swab     Status: Abnormal   Collection Time: 10/27/22 11:08 AM   Specimen: Anterior Nasal Swab  Result Value Ref Range Status   SARS Coronavirus 2 by RT PCR POSITIVE (A) NEGATIVE Final    Comment: (NOTE) SARS-CoV-2 target nucleic acids are DETECTED.  The SARS-CoV-2 RNA is generally detectable in  upper respiratory specimens during the acute phase of infection. Positive results are indicative of the presence of the identified virus, but do not rule out bacterial infection or co-infection with other pathogens not detected by the test. Clinical correlation with patient history and other diagnostic information is necessary to determine patient infection status. The expected result is Negative.  Fact Sheet for Patients: EntrepreneurPulse.com.au  Fact Sheet for Healthcare Providers: IncredibleEmployment.be  This test is not yet approved or cleared by the [place] FDA and  has been authorized for detection and/or diagnosis of SARS-CoV-2 by FDA under an Emergency Use Authorization (EUA).  This EUA will remain in effect (meaning this test can be used) for the duration of  the COVID-19 declaration under Section 564(b)(1) of the A ct, 21 U.S.C. section 360bbb-3(b)(1), unless the authorization is terminated or revoked sooner.     Influenza A by PCR NEGATIVE NEGATIVE Final   Influenza B by PCR  NEGATIVE NEGATIVE Final    Comment: (NOTE) The Xpert Xpress SARS-CoV-2/FLU/RSV plus assay is intended as an aid in the diagnosis of influenza from Nasopharyngeal swab specimens and should not be used as a sole basis for treatment. Nasal washings and aspirates are unacceptable for Xpert Xpress SARS-CoV-2/FLU/RSV testing.  Fact Sheet for Patients: EntrepreneurPulse.com.au  Fact Sheet for Healthcare Providers: IncredibleEmployment.be  This test is not yet approved or cleared by the [place] FDA and has been authorized for detection and/or diagnosis of SARS-CoV-2 by FDA under an Emergency Use Authorization (EUA). This EUA will remain in effect (meaning this test can be used) for the duration of the COVID-19 declaration under Section 564(b)(1) of the Act, 21 U.S.C. section 360bbb-3(b)(1), unless the authorization is  terminated or revoked.     Resp Syncytial Virus by PCR NEGATIVE NEGATIVE Final    Comment: (NOTE) Fact Sheet for Patients: EntrepreneurPulse.com.au  Fact Sheet for Healthcare Providers: IncredibleEmployment.be  This test is not yet approved or cleared by the [place] FDA and has been authorized for detection and/or diagnosis of SARS-CoV-2 by FDA under an Emergency Use Authorization (EUA). This EUA will remain in effect (meaning this test can be used) for the duration of the COVID-19 declaration under Section 564(b)(1) of the Act, 21 U.S.C. section 360bbb-3(b)(1), unless the authorization is terminated or revoked.  Performed at [facility], [place] [address] [place], [place] [address]      Labs: Basic Metabolic Panel: Recent Labs  Lab 10/27/22 1128 10/28/22 0831 10/29/22 0206 10/30/22 0614 10/31/22 0746 11/01/22 0221 11/02/22 0309  NA 134* 138 139 137 134* 138 137  K 3.6 3.6 2.8* 4.7 4.6 3.9 3.7  CL 107 111 107 106 104 103 104  CO2 18* 19* 22 21* 21* 23 24  GLUCOSE 111* 117* 184* 115* 111* 118* 124*  BUN 8 7* '8 8 9 8 8  '$ CREATININE 0.58 0.48 0.75 0.64 0.73 0.68 0.68  CALCIUM 8.5* 8.4* 8.4* 8.8* 9.0 9.1 9.2  MG 2.2 2.3 2.2  --   --   --   --    Liver Function Tests: Recent Labs  Lab 11/02/22 0309  AST 26  ALT 28  ALKPHOS 47  BILITOT 0.3  PROT 6.4*  ALBUMIN 3.6   CBC: Recent Labs  Lab 10/28/22 0831 10/29/22 0206 10/30/22 0614 11/01/22 0221 11/02/22 0309  WBC 5.7 4.6 4.7 7.9 8.3  HGB 12.3 11.7* 12.4 12.2 12.6  HCT 37.5 36.2 39.3 37.5 39.6  MCV 87.0 87.4 88.9 87.8 88.6  PLT 224 232 222 252 307   CBG: Recent Labs  Lab 11/01/22 0745 11/01/22 1219 11/01/22 1626 11/01/22 2028 11/02/22 0852  GLUCAP 150* 114* 149* 129* 196*   Hgb A1c No results for input(s): "HGBA1C" in the last 72 hours. Lipid Profile No results for input(s): "CHOL", "HDL", "LDLCALC", "TRIG", "CHOLHDL", "LDLDIRECT" in the last 72  hours. Thyroid function studies No results for input(s): "TSH", "T4TOTAL", "T3FREE", "THYROIDAB" in the last 72 hours.  Invalid input(s): "FREET3" Urinalysis    Component Value Date/Time   COLORURINE YELLOW 10/25/2022 0055   APPEARANCEUR CLEAR 10/25/2022 0055   LABSPEC 1.028 10/25/2022 0055   PHURINE 6.0 10/25/2022 0055   GLUCOSEU NEGATIVE 10/25/2022 0055   GLUCOSEU NEGATIVE 04/16/2019 1036   HGBUR NEGATIVE 10/25/2022 0055   HGBUR 1+ 08/11/2009 0829   BILIRUBINUR NEGATIVE 10/25/2022 0055   BILIRUBINUR negative 04/28/2022 1409   KETONESUR 5 (A) 10/25/2022 0055   PROTEINUR NEGATIVE 10/25/2022 0055   UROBILINOGEN negative (  A) 04/28/2022 1409   UROBILINOGEN 0.2 04/16/2019 1036   NITRITE NEGATIVE 10/25/2022 0055   LEUKOCYTESUR NEGATIVE 10/25/2022 0055    FURTHER DISCHARGE INSTRUCTIONS:   Get Medicines reviewed and adjusted: Please take all your medications with you for your next visit with your Primary MD   Laboratory/radiological data: Please request your Primary MD to go over all hospital tests and procedure/radiological results at the follow up, please ask your Primary MD to get all Hospital records sent to his/her office.   In some cases, they will be blood work, cultures and biopsy results pending at the time of your discharge. Please request that your primary care M.D. goes through all the records of your hospital data and follows up on these results.   Also Note the following: If you experience worsening of your admission symptoms, develop shortness of breath, life threatening emergency, suicidal or homicidal thoughts you must seek medical attention immediately by calling 911 or calling your MD immediately  if symptoms less severe.   You must read complete instructions/literature along with all the possible adverse reactions/side effects for all the Medicines you take and that have been prescribed to you. Take any new Medicines after you have completely understood and accpet  all the possible adverse reactions/side effects.    Do not drive when taking Pain medications or sleeping medications (Benzodaizepines)   Do not take more than prescribed Pain, Sleep and Anxiety Medications. It is not advisable to combine anxiety,sleep and pain medications without talking with your primary care practitioner   Special Instructions: If you have smoked or chewed Tobacco  in the last 2 yrs please stop smoking, stop any regular Alcohol  and or any Recreational drug use.   Wear Seat belts while driving.   Please note: You were cared for by a hospitalist during your hospital stay. Once you are discharged, your primary care physician will handle any further medical issues. Please note that NO REFILLS for any discharge medications will be authorized once you are discharged, as it is imperative that you return to your primary care physician (or establish a relationship with a primary care physician if you do not have one) for your post hospital discharge needs so that they can reassess your need for medications and monitor your lab values.  Time coordinating discharge: 35 minutes  SIGNED:  Marzetta Board, MD, PhD 11/02/2022, 9:22 AM

## 2022-11-02 NOTE — Progress Notes (Signed)
Mobility Specialist - Progress Note   11/02/22 1031  Mobility  Activity Ambulated independently in hallway  Level of Assistance Independent  Assistive Device None  Distance Ambulated (ft) 300 ft  Activity Response Tolerated well  Mobility Referral Yes  $Mobility charge 1 Mobility    Pt received in BR agreeable to mobility. No complaints throughout, distance limited by BM urgency. Left in room w/ all needs met.   [place] Specialist Please contact via SecureChat or Rehab office at [phone]

## 2022-11-02 NOTE — Progress Notes (Signed)
Butch Penny, LPN wasted 0.5 mg of IV dilaudid in stericycle bin.

## 2022-11-02 NOTE — TOC Progression Note (Signed)
Transition of Care [facility]) - Progression Note    Patient Details  Name: Angel Holland MRN: [id number] Date of Birth: 28-Jun-1941  [facility] CM/SW Contact  Jacalyn Lefevre Edson Snowball, RN Phone Number: 11/02/2022, 9:57 AM  Clinical Narrative:     PT now recommending HHPT. Discussed with patient. Patient in agreement. No preference. Malachy Mood with [facility] accepted referral, asked to add [facility] for medication management. NCM secure chatted MD   Expected Discharge Plan: [place] Barriers to Discharge: No Barriers Identified  Expected Discharge Plan and Services   Discharge Planning Services: CM Consult Post Acute Care Choice: [place] arrangements for the past 2 months: Single Family Home Expected Discharge Date: 11/02/22                 DME Agency: NA       HH Arranged: RN, PT [place] Agency: [place] Date HH Agency Contacted: 11/02/22 Time [place]: [phone] Representative spoke with at [place]: [place] Determinants of Health (SDOH) Interventions SDOH Screenings   Food Insecurity: No Food Insecurity (12/05/2021)  Housing: Low Risk  (12/05/2021)  Transportation Needs: No Transportation Needs (12/05/2021)  Alcohol Screen: Low Risk  (12/05/2021)  Depression (PHQ2-9): Low Risk  (12/05/2021)  Financial Resource Strain: Low Risk  (12/05/2021)  Physical Activity: Sufficiently Active (12/05/2021)  Social Connections: Moderately Integrated (12/05/2021)  Stress: No Stress Concern Present (12/05/2021)  Tobacco Use: Medium Risk (10/24/2022)    Readmission Risk Interventions     No data to display

## 2022-11-03 ENCOUNTER — Telehealth: Payer: Self-pay | Admitting: Nurse Practitioner

## 2022-11-03 MED ORDER — FLUCONAZOLE 150 MG PO TABS: 150.0000 mg | ORAL_TABLET | ORAL | 0 refills | Status: DC

## 2022-11-03 NOTE — Telephone Encounter (Signed)
Pt thinks she has a yeast infection, she is wanting a script for this. She has been in hosp for 9 days and she can not come into the office. She was in for Acute diverticulitis Principal problem.   [facility] [id number], [place], [place] [address] Phone: [phone]  Fax: [phone] DEA #: [id number]   Pt @ [phone] (Home)

## 2022-11-03 NOTE — Telephone Encounter (Signed)
Transition Care Management Follow-up Telephone Call Date of discharge and from where: 11/02/22 [facility]  How have you been since you were released from the hospital? [facility], still having diarrhea Any questions or concerns? Yes  - have a yeast infection and rash in her perineal area, requesting diflucan  Items Reviewed: Did the pt receive and understand the discharge instructions provided? Yes  Medications obtained and verified? Yes  - son is picking them up today Any new allergies since your discharge? No  Dietary orders reviewed? Yes Do you have support at home?  Son until he goes out of town Other (ie: DME, Home Health, etc) n/a  Functional Questionnaire: (I = Independent and D = Dependent)  Bathing/Dressing- I   Meal Prep- I  Eating- I  Maintaining continence- D  Transferring/Ambulation- I  Managing Meds- I   Follow up appointments reviewed:   PCP Hospital f/u appt confirmed? Yes  scheduled to see Vance Peper, NP on 11/08/22 @ 1120. [facility] f/u appt confirmed? Yes  scheduled to see Dr. Fuller Plan on 11/15/22 @ 1500. Are transportation arrangements needed? No  If their condition worsens, is the pt aware to call  their PCP or go to the ED? Yes Was the patient provided with contact information for the PCP's office or ED? Yes Was the pt encouraged to call back with questions or concerns? Yes

## 2022-11-03 NOTE — Telephone Encounter (Signed)
Pt has spoke with lauren she has gotten rx for yeast infection and  began treatment.

## 2022-11-04 DIAGNOSIS — I482 Chronic atrial fibrillation, unspecified: Secondary | ICD-10-CM | POA: Diagnosis not present

## 2022-11-04 DIAGNOSIS — K581 Irritable bowel syndrome with constipation: Secondary | ICD-10-CM | POA: Diagnosis not present

## 2022-11-04 DIAGNOSIS — R1312 Dysphagia, oropharyngeal phase: Secondary | ICD-10-CM | POA: Diagnosis not present

## 2022-11-04 DIAGNOSIS — E119 Type 2 diabetes mellitus without complications: Secondary | ICD-10-CM | POA: Diagnosis not present

## 2022-11-04 DIAGNOSIS — K5792 Diverticulitis of intestine, part unspecified, without perforation or abscess without bleeding: Secondary | ICD-10-CM | POA: Diagnosis not present

## 2022-11-04 DIAGNOSIS — Z79891 Long term (current) use of opiate analgesic: Secondary | ICD-10-CM | POA: Diagnosis not present

## 2022-11-04 DIAGNOSIS — K580 Irritable bowel syndrome with diarrhea: Secondary | ICD-10-CM | POA: Diagnosis not present

## 2022-11-06 DIAGNOSIS — K581 Irritable bowel syndrome with constipation: Secondary | ICD-10-CM | POA: Diagnosis not present

## 2022-11-06 DIAGNOSIS — K5792 Diverticulitis of intestine, part unspecified, without perforation or abscess without bleeding: Secondary | ICD-10-CM | POA: Diagnosis not present

## 2022-11-06 DIAGNOSIS — E119 Type 2 diabetes mellitus without complications: Secondary | ICD-10-CM | POA: Diagnosis not present

## 2022-11-06 DIAGNOSIS — I482 Chronic atrial fibrillation, unspecified: Secondary | ICD-10-CM | POA: Diagnosis not present

## 2022-11-06 DIAGNOSIS — Z79891 Long term (current) use of opiate analgesic: Secondary | ICD-10-CM | POA: Diagnosis not present

## 2022-11-06 DIAGNOSIS — K580 Irritable bowel syndrome with diarrhea: Secondary | ICD-10-CM | POA: Diagnosis not present

## 2022-11-07 ENCOUNTER — Telehealth: Payer: Self-pay | Admitting: Nurse Practitioner

## 2022-11-07 MED ORDER — COLESTIPOL HCL 5 G PO PACK: 3.0000 g | PACK | Freq: Every day | ORAL | 0 refills | Status: DC

## 2022-11-07 NOTE — Telephone Encounter (Signed)
Received a call with the following questions:   She has mentioned a few questions that she is wanting to speak about in person.    -bad taste in mouth  -lips are swollen and chapped ever since hospital visit  -bled from rectum Sunday (1/07)  -chokes on pills, nurse recommended GI to address this however pt believes it is more of a psychological issue than it is medical  -PT came to home, working with pt to get life alert. In order for insurance to cover PCP needs to recommend.  -Pt took meds for yeast infection and has 2 left. If infection flares up can she take the last two pills?  -Colestipol is too big for pt to swallow, is there an alternative?

## 2022-11-08 ENCOUNTER — Telehealth: Payer: Medicare Other | Admitting: Nurse Practitioner

## 2022-11-08 DIAGNOSIS — K5792 Diverticulitis of intestine, part unspecified, without perforation or abscess without bleeding: Secondary | ICD-10-CM | POA: Diagnosis not present

## 2022-11-08 DIAGNOSIS — I482 Chronic atrial fibrillation, unspecified: Secondary | ICD-10-CM | POA: Diagnosis not present

## 2022-11-08 DIAGNOSIS — E119 Type 2 diabetes mellitus without complications: Secondary | ICD-10-CM | POA: Diagnosis not present

## 2022-11-08 DIAGNOSIS — K580 Irritable bowel syndrome with diarrhea: Secondary | ICD-10-CM | POA: Diagnosis not present

## 2022-11-08 DIAGNOSIS — K581 Irritable bowel syndrome with constipation: Secondary | ICD-10-CM | POA: Diagnosis not present

## 2022-11-08 DIAGNOSIS — Z79891 Long term (current) use of opiate analgesic: Secondary | ICD-10-CM | POA: Diagnosis not present

## 2022-11-08 NOTE — Telephone Encounter (Signed)
Called and spoke with patient advised to  that she she had enough medication for yeast infection  and for use the cream also. Pt stated that she  understood , reminder her of appt for next Monday.

## 2022-11-08 NOTE — Telephone Encounter (Signed)
Called and spoke w/ pt  advised pt of recommendations and  can of medication  from tablet to a powder. Informed pt that all other concerns will be addressed at her appt. Pt wanted to know about the yeast that she is having , I told that ask about this , all other will be addressed at her appt.

## 2022-11-09 DIAGNOSIS — K580 Irritable bowel syndrome with diarrhea: Secondary | ICD-10-CM | POA: Diagnosis not present

## 2022-11-09 DIAGNOSIS — K5792 Diverticulitis of intestine, part unspecified, without perforation or abscess without bleeding: Secondary | ICD-10-CM | POA: Diagnosis not present

## 2022-11-09 DIAGNOSIS — E119 Type 2 diabetes mellitus without complications: Secondary | ICD-10-CM | POA: Diagnosis not present

## 2022-11-09 DIAGNOSIS — K581 Irritable bowel syndrome with constipation: Secondary | ICD-10-CM | POA: Diagnosis not present

## 2022-11-09 DIAGNOSIS — I482 Chronic atrial fibrillation, unspecified: Secondary | ICD-10-CM | POA: Diagnosis not present

## 2022-11-09 DIAGNOSIS — Z79891 Long term (current) use of opiate analgesic: Secondary | ICD-10-CM | POA: Diagnosis not present

## 2022-11-10 ENCOUNTER — Telehealth: Payer: Self-pay | Admitting: Nurse Practitioner

## 2022-11-10 MED ORDER — COLESTIPOL HCL 5 G PO PACK: 3.0000 g | PACK | Freq: Every day | ORAL | 0 refills | Status: DC

## 2022-11-10 NOTE — Telephone Encounter (Signed)
Pt stated he need medication in powder not pill. Pt stated her pharmacy do not have it in powder and [facility] [address] in [place] [phone]

## 2022-11-10 NOTE — Telephone Encounter (Signed)
Called and notified pt

## 2022-11-13 ENCOUNTER — Ambulatory Visit (INDEPENDENT_AMBULATORY_CARE_PROVIDER_SITE_OTHER): Payer: Medicare Other | Admitting: Nurse Practitioner

## 2022-11-13 ENCOUNTER — Encounter: Payer: Self-pay | Admitting: Nurse Practitioner

## 2022-11-13 VITALS — BP 108/62 | HR 74 | Ht <= 58 in | Wt 119.4 lb

## 2022-11-13 DIAGNOSIS — N858 Other specified noninflammatory disorders of uterus: Secondary | ICD-10-CM

## 2022-11-13 DIAGNOSIS — B379 Candidiasis, unspecified: Secondary | ICD-10-CM

## 2022-11-13 DIAGNOSIS — Z8616 Personal history of COVID-19: Secondary | ICD-10-CM | POA: Diagnosis not present

## 2022-11-13 DIAGNOSIS — K5792 Diverticulitis of intestine, part unspecified, without perforation or abscess without bleeding: Secondary | ICD-10-CM | POA: Diagnosis not present

## 2022-11-13 DIAGNOSIS — E119 Type 2 diabetes mellitus without complications: Secondary | ICD-10-CM | POA: Diagnosis not present

## 2022-11-13 LAB — CBC WITH DIFFERENTIAL/PLATELET
Basophils Absolute: 0.1 10*3/uL (ref 0.0–0.1)
Basophils Relative: 1 % (ref 0.0–3.0)
Eosinophils Absolute: 0 10*3/uL (ref 0.0–0.7)
Eosinophils Relative: 0.3 % (ref 0.0–5.0)
HCT: 40 % (ref 36.0–46.0)
Hemoglobin: 13.1 g/dL (ref 12.0–15.0)
Lymphocytes Relative: 16.9 % (ref 12.0–46.0)
Lymphs Abs: 1.5 10*3/uL (ref 0.7–4.0)
MCHC: 32.9 g/dL (ref 30.0–36.0)
MCV: 87.4 fl (ref 78.0–100.0)
Monocytes Absolute: 0.6 10*3/uL (ref 0.1–1.0)
Monocytes Relative: 7.3 % (ref 3.0–12.0)
Neutro Abs: 6.4 10*3/uL (ref 1.4–7.7)
Neutrophils Relative %: 74.5 % (ref 43.0–77.0)
Platelets: 390 10*3/uL (ref 150.0–400.0)
RBC: 4.58 Mil/uL (ref 3.87–5.11)
RDW: 14.2 % (ref 11.5–15.5)
WBC: 8.6 10*3/uL (ref 4.0–10.5)

## 2022-11-13 LAB — COMPREHENSIVE METABOLIC PANEL
ALT: 13 U/L (ref 0–35)
AST: 13 U/L (ref 0–37)
Albumin: 4.4 g/dL (ref 3.5–5.2)
Alkaline Phosphatase: 67 U/L (ref 39–117)
BUN: 16 mg/dL (ref 6–23)
CO2: 27 mEq/L (ref 19–32)
Calcium: 10 mg/dL (ref 8.4–10.5)
Chloride: 101 mEq/L (ref 96–112)
Creatinine, Ser: 0.69 mg/dL (ref 0.40–1.20)
GFR: 81.01 mL/min (ref >60.00)
Glucose, Bld: 105 mg/dL — ABNORMAL HIGH (ref 70–99)
Potassium: 4.1 mEq/L (ref 3.5–5.1)
Sodium: 139 mEq/L (ref 135–145)
Total Bilirubin: 0.3 mg/dL (ref 0.2–1.2)
Total Protein: 7.1 g/dL (ref 6.0–8.3)

## 2022-11-13 LAB — HEMOGLOBIN A1C: Hgb A1c MFr Bld: 6.6 % — ABNORMAL HIGH (ref 4.6–6.5)

## 2022-11-13 NOTE — Patient Instructions (Addendum)
It was great to see you!  We are checking your labs today and will let you know the results via mychart/phone.   Keep your appointment with Dr. Fuller Plan on 11/15/22  Take 1 more diflucan today. Keep using the cream on your inner thighs and bottom.   Let's follow-up in 1 month, sooner if you have concerns.  If a referral was placed today, you will be contacted for an appointment. Please note that routine referrals can sometimes take up to 3-4 weeks to process. Please call our office if you haven't heard anything after this time frame.  Take care,  Vance Peper, NP

## 2022-11-13 NOTE — Progress Notes (Unsigned)
Established Patient Office Visit  Subjective   Patient ID: Angel Holland, female    DOB: [birth year]  Age: 82 y.o. MRN: [id number]  Chief Complaint  Patient presents with   Hospitalization [facility] follow up     HPI  Angel Holland is here to follow-up after hospitalization for diverticulitis and also an incidental finding of covid-19. She was treated with IV antibiotics and given miralax. Since then, she has been having ongoing diarrhea. She states that it has slowed down some over the past 2 weeks. She has been having irritation and a yeast infection to her vaginal and inner thighs. This is doing slightly better since starting diflucan and using the Gerhardt's cream. She was discharged with [facility] home health.   "Admit date: 10/24/2022 Discharge date: 11/02/2022   Admitted From: home Disposition:  home   Recommendations for Outpatient Follow-up:  Follow up with PCP in 1-2 weeks   Home Health: PT Equipment/Devices: none   Discharge Condition: stable CODE STATUS: Full code   HPI: Per admitting MD, Angel Holland is a 82 y.o. female with medical history significant of IBS-D, T2DM, atrial fibrillation who presents to the emergency department due to rectal pain.  Patient states that she has been taking fiber pills for more than 20 years and this has resulted in her having diarrhea.  Her son told her that the fiber pills was possibly because of her chronic diarrhea, so she stopped taking it and shortly after this, she started to have constipation.  Due to need to spend time with some family members during the Christmas and did not want to have to use the bathroom at the family's house, she took 2 Imodium tablets.  Last bowel movement was about 4 days ago.  Yesterday, she developed a severe rectal pain while trying to have a bowel movement, and the pain has been persistent for about 6 hours, associated to go to the ED for further evaluation and management.    Hospital Course /  Discharge diagnoses: Principal Problem:   Acute diverticulitis Active Problems:   Diabetes mellitus type II, controlled ([place])   Rectal pain   Constipation   Atrial fibrillation, chronic (HCC)   Diarrhea     Principal problem Acute Diverticulitis -GI consulted, evaluated patient, she is now status post 7 days of antibiotics with cefepime and Flagyl.  She reports improvement in her abdominal pain, now tolerating a regular diet.   Active problems Diarrhea -likely multifactorial in the setting of recent antibiotic use, Covid, also IBS.  She was placed on colestipol, dicyclomine was added, and now improved and she has returned to baseline.  Rectal Pain - Suspect related to fecal impaction.  Resolved after enema Chronic A fib -On Cardizem and baby aspirin. DM type 2 -Continue home medications Hypokalemia -improved after repletion Endometrial fluid attenuation - she will need follow up with GYN. Patient and family aware.  Covid viral illness -completed Paxlovid.  No significant respiratory symptoms Rash back; suspect contact dermatitis - Start Triamcinolone Eucerin.    Sepsis ruled out     Discharge Instructions     Allergies as of 11/02/2022         Reactions    Biaxin [clarithromycin] Other (See Comments)    BREASTS INFLAMED    Codeine Other (See Comments)    PANIC ATTACKS, CAN'T SLEEP    Decongestant [pseudoephedrine Hcl Er] Other (See Comments)    Cannot take with Dilitiazem    Phenylephrine Other (  See Comments)    Cannot take with Dilitiazem    Rosuvastatin Nausea And Vomiting, Other (See Comments)    Acetaminophen Other (See Comments)    Triggered her "heart problem". Referring to atrial fibrillation            Medication List       TAKE these medications     AMBULATORY NON FORMULARY MEDICATION Take 2 capsules by mouth daily. Medication Name: Leverne Humbles    aspirin EC 81 MG tablet Take 81 mg by mouth See admin instructions. Take one tablet (81 mg) by  mouth every morning, may also take one tablet (81 mg) at night as needed for pain/sleep    BENEFIBER DRINK MIX PO Take 15 mLs by mouth 2 (two) times daily.    colestipol 1 g tablet Commonly known as: COLESTID Take 1 tablet (1 g total) by mouth 2 (two) times daily.    dicyclomine 10 MG capsule Commonly known as: BENTYL TAKE 1 CAPSULE BY MOUTH FOUR TIMES DAILY BEFORE MEALS AND AT BEDTIME What changed:  how much to take how to take this when to take this additional instructions    diltiazem 120 MG 24 hr capsule Commonly known as: CARDIZEM CD Take 1 capsule (120 mg total) by mouth daily.    fluconazole 150 MG tablet Commonly known as: DIFLUCAN Take 1 tablet after finishing your antibiotic, you can take a second one in 3 days if needed    hydrocodone-ibuprofen 5-200 MG tablet Commonly known as: VICOPROFEN Take 1 tablet by mouth daily as needed for pain. What changed:  when to take this reasons to take this    loperamide 2 MG capsule Commonly known as: IMODIUM Take 2 mg by mouth daily as needed for diarrhea or loose stools.    metroNIDAZOLE 0.75 % gel Commonly known as: METROGEL Apply 1 application topically every morning. Applies to face for rosacea    OCUVITE PO Take 1 tablet by mouth at bedtime.    triamcinolone 0.1 % cream : eucerin Crea Apply 1 Application topically 2 (two) times daily for 5 days. Back itchy areas    triamcinolone 55 MCG/ACT Aero nasal inhaler Commonly known as: NASACORT Place 2 sprays into the nose daily as needed (congestion/sinus headache).    VITAMIN B-12 PO Take 130 mg by mouth daily with supper.    VITAMIN C PO Take 1,500 mg by mouth 2 (two) times daily with a meal.    VITAMIN D PO Take 1,000 Units by mouth daily with breakfast.             Follow-up Information       Ladene Artist, MD Follow up on 11/15/2022.   Specialty: Gastroenterology Why: 3pm Contact information: [address] [place] [address] [phone]                           Consultations: GI"  Transition of [facility] Follow up.   Hospital/Facility: [facility]  D/C Physician: Dr. Marzetta Board D/C Date: 11/02/22  Records Requested: 11/14/22 Records Received:  11/14/22 Records Reviewed:  11/14/22  Diagnoses on Discharge: Diverticulitis, Diarrhea, Rectal Pain, Covid viral illness, hypokalemia, DM type 2, endometrial fluid attenuation   Date of interactive Contact within 48 hours of discharge:  Contact was through: phone  Date of 7 day or 14 day face-to-face visit:    within 14 days  Outpatient Encounter Medications as of 11/13/2022  Medication Sig  AMBULATORY NON FORMULARY MEDICATION Take 2 capsules by mouth daily. Medication Name: Osteo Matrix-Shaklee   Ascorbic Acid (VITAMIN C PO) Take 1,500 mg by mouth 2 (two) times daily with a meal.   aspirin EC 81 MG tablet Take 81 mg by mouth See admin instructions. Take one tablet (81 mg) by mouth every morning, may also take one tablet (81 mg) at night as needed for pain/sleep   Cyanocobalamin (VITAMIN B-12 PO) Take 130 mg by mouth daily with supper.   diltiazem (CARDIZEM CD) 120 MG 24 hr capsule Take 1 capsule (120 mg total) by mouth daily.   hydrocodone-ibuprofen (VICOPROFEN) 5-200 MG tablet Take 1 tablet by mouth daily as needed for pain. (Patient taking differently: Take 1 tablet by mouth at bedtime as needed for pain (for back pain, which helps with insomnia).)   Multiple Vitamins-Minerals (OCUVITE PO) Take 1 tablet by mouth at bedtime.   triamcinolone (NASACORT) 55 MCG/ACT AERO nasal inhaler Place 2 sprays into the nose daily as needed (congestion/sinus headache).    VITAMIN D PO Take 1,000 Units by mouth daily with breakfast.   Wheat Dextrin (BENEFIBER DRINK MIX PO) Take 15 mLs by mouth 2 (two) times daily.   colestipol (COLESTID) 5 g packet Take 3 g by mouth daily. 1/2 packet daily. (Patient not taking: Reported on 11/13/2022)   dicyclomine (BENTYL) 10 MG capsule  TAKE 1 CAPSULE BY MOUTH FOUR TIMES DAILY BEFORE MEALS AND AT BEDTIME (Patient not taking: Reported on 11/13/2022)   fluconazole (DIFLUCAN) 150 MG tablet Take 1 tablet (150 mg total) by mouth once a week. Take 1 tablet after finishing your antibiotic, you can take a second one in 3 days if needed (Patient not taking: Reported on 11/13/2022)   loperamide (IMODIUM) 2 MG capsule Take 2 mg by mouth daily as needed for diarrhea or loose stools.  (Patient not taking: Reported on 11/13/2022)   metroNIDAZOLE (METROGEL) 0.75 % gel Apply 1 application topically every morning. Applies to face for rosacea   No facility-administered encounter medications on file as of 11/13/2022.    Diagnostic Tests Reviewed/Disposition: Reviewed in chart  Consults: GI  Discharge Instructions: See above  Disease/illness Education: Completed today  Home Health/Community Services Discussions/Referrals: Discussed with patient  Establishment or re-establishment of referral orders for community resources: Dicussed with patient  Discussion with other health care providers: Reviewed notes  Assessment and Support of treatment regimen adherence: Discussed with patient  Appointments Coordinated with: GI, GYN  Education for self-management, independent living, and ADLs: Discussed with patient     ROS See pertinent positives and negatives per HPI.    Objective:     BP 108/62   Pulse 74   Ht '4\' 10"'$  (1.473 m)   Wt 119 lb 6.4 oz (54.2 kg)   SpO2 96%   BMI 24.95 kg/m  BP Readings from Last 3 Encounters:  11/13/22 108/62  11/02/22 (!) 103/55  07/28/22 112/62   Wt Readings from Last 3 Encounters:  11/13/22 119 lb 6.4 oz (54.2 kg)  10/28/22 125 lb (56.7 kg)  07/28/22 125 lb (56.7 kg)      Physical Exam Vitals and nursing note reviewed.  Constitutional:      General: She is not in acute distress.    Appearance: Normal appearance.  HENT:     Head: Normocephalic.  Eyes:     Conjunctiva/sclera: Conjunctivae  normal.  Cardiovascular:     Rate and Rhythm: Normal rate and regular rhythm.     Pulses: Normal pulses.  Heart sounds: Normal heart sounds.  Pulmonary:     Effort: Pulmonary effort is normal.     Breath sounds: Normal breath sounds.  Abdominal:     General: There is no distension.     Palpations: Abdomen is soft.     Tenderness: There is no abdominal tenderness. There is no guarding.  Musculoskeletal:     Cervical back: Normal range of motion.  Skin:    General: Skin is warm.     Findings: Erythema (redness, scaling skin to inner thighs) present.  Neurological:     General: No focal deficit present.     Mental Status: She is alert and oriented to person, place, and time.  Psychiatric:        Mood and Affect: Mood normal.        Behavior: Behavior normal.        Thought Content: Thought content normal.        Judgment: Judgment normal.      Assessment & Plan:   Problem List Items Addressed This Visit       Digestive   Acute diverticulitis - Primary    She was recently hospitalized for diverticulitis from 10/25/22 to 11/02/22. She received IV antibiotics. When she was admitted, she had fullness in her rectum was had constipation. She was treated with miralax prep for colonoscopy. Since then, she has been experiencing diarrhea. Her symptoms have slightly improved since being discharged. She did have low potassium during her admission, will repeat CBC and CMP today. Medication reconciliation completed. She has not started the colestid as she is waiting for the powder to become available. She has an appointment with GI on 11/15/22, encouraged her to keep this appointment. Will have her follow-up in 4 weeks.       Relevant Orders   CBC with Differential/Platelet (Completed)   Comprehensive metabolic panel (Completed)     Endocrine   Diabetes mellitus type II, controlled ([place])    Chronic, stable. Will check A1c today. She is bringing a urine cup home to provide a urine sample  for urine microalbumin next visit. Well controlled with diet alone.       Relevant Orders   Hemoglobin A1c (Completed)     Genitourinary   Abnormal collection of fluid in uterine cavity    CT scan noted abnormal fluid collection in endometrial canal. She was seeing Dr. Harrington Challenger at [facility] and would like to go back to this location. Referral placed.       Relevant Orders   Ambulatory referral to Gynecology   Other Visit Diagnoses     Yeast infection       Ongoing still to inner thighs/labia. Take 1 more diflucan '150mg'$  today and continue Gerhardt's cream. F/U if not improving   History of COVID-19       PCR test positive in hospital and treated with paxlovid. Doing well now, no further symtpoms.       Return in about 4 weeks (around 12/11/2022) for diarrhea .    Charyl Dancer, NP

## 2022-11-14 ENCOUNTER — Telehealth: Payer: Self-pay | Admitting: Nurse Practitioner

## 2022-11-14 DIAGNOSIS — N858 Other specified noninflammatory disorders of uterus: Secondary | ICD-10-CM | POA: Insufficient documentation

## 2022-11-14 NOTE — Telephone Encounter (Signed)
She has not received her colestid from the pharmacy and is confused about it needing to be ordered per the pharmacy staff.

## 2022-11-14 NOTE — Assessment & Plan Note (Signed)
She was recently hospitalized for diverticulitis from 10/25/22 to 11/02/22. She received IV antibiotics. When she was admitted, she had fullness in her rectum was had constipation. She was treated with miralax prep for colonoscopy. Since then, she has been experiencing diarrhea. Her symptoms have slightly improved since being discharged. She did have low potassium during her admission, will repeat CBC and CMP today. Medication reconciliation completed. She has not started the colestid as she is waiting for the powder to become available. She has an appointment with GI on 11/15/22, encouraged her to keep this appointment. Will have her follow-up in 4 weeks.

## 2022-11-14 NOTE — Assessment & Plan Note (Signed)
Chronic, stable. Will check A1c today. She is bringing a urine cup home to provide a urine sample for urine microalbumin next visit. Well controlled with diet alone.

## 2022-11-14 NOTE — Assessment & Plan Note (Signed)
CT scan noted abnormal fluid collection in endometrial canal. She was seeing Dr. Harrington Challenger at [facility] and would like to go back to this location. Referral placed.

## 2022-11-15 ENCOUNTER — Encounter: Payer: Self-pay | Admitting: Gastroenterology

## 2022-11-15 ENCOUNTER — Telehealth: Payer: Self-pay | Admitting: Nurse Practitioner

## 2022-11-15 ENCOUNTER — Other Ambulatory Visit: Payer: Self-pay | Admitting: Nurse Practitioner

## 2022-11-15 ENCOUNTER — Ambulatory Visit (INDEPENDENT_AMBULATORY_CARE_PROVIDER_SITE_OTHER): Payer: Medicare Other | Admitting: Gastroenterology

## 2022-11-15 VITALS — BP 106/60 | HR 97 | Ht <= 58 in | Wt 122.2 lb

## 2022-11-15 DIAGNOSIS — R1312 Dysphagia, oropharyngeal phase: Secondary | ICD-10-CM

## 2022-11-15 DIAGNOSIS — K5792 Diverticulitis of intestine, part unspecified, without perforation or abscess without bleeding: Secondary | ICD-10-CM

## 2022-11-15 DIAGNOSIS — R197 Diarrhea, unspecified: Secondary | ICD-10-CM | POA: Diagnosis not present

## 2022-11-15 NOTE — Telephone Encounter (Signed)
Verbal orders given written orders will be faxed for signature.

## 2022-11-15 NOTE — Progress Notes (Signed)
Assessment     Acute diverticulitis, resolved Multifactorial diarrhea, resolved IBS-D Pill dysphagia   Recommendations    Decrease dicyclomine to 10 mg bid. Stop Colestipol.  Avoid large or dry pills - seek alternatives from prescribing providers Follow up with PCP   HPI    This is an 82 year old female recently hospitalized for acute diverticulitis followed by diarrhea. Her diarrhea was felt to be multifactorial in the setting of recent antibiotic use, recent COVID, history of IBS-D.  Colestipol was used initially for diarrhea control and she discontinued as she was having difficulty swallowing it.  She notes difficulty swallowing other large or dry pills. Denies liquid or solid dysphagia. Notes little to no saliva for about 1 year.   Colonoscopy Feb 2019: severe left colon diverticulosis and internal hemorrhoids  Labs / Imaging       Latest Ref Rng & Units 11/13/2022   10:31 AM 11/02/2022    3:09 AM 07/28/2022   11:50 AM  Hepatic Function  Total Protein 6.0 - 8.3 g/dL 7.1  6.4  7.2   Albumin 3.5 - 5.2 g/dL 4.4  3.6  4.2   AST 0 - 37 U/L '13  26  13   '$ ALT 0 - 35 U/L '13  28  16   '$ Alk Phosphatase 39 - 117 U/L 67  47  66   Total Bilirubin 0.2 - 1.2 mg/dL 0.3  0.3  0.3        Latest Ref Rng & Units 11/13/2022   10:31 AM 11/02/2022    3:09 AM 11/01/2022    2:21 AM  CBC  WBC 4.0 - 10.5 K/uL 8.6  8.3  7.9   Hemoglobin 12.0 - 15.0 g/dL 13.1  12.6  12.2   Hematocrit 36.0 - 46.0 % 40.0  39.6  37.5   Platelets 150.0 - 400.0 K/uL 390.0  307  252      CT ABDOMEN PELVIS W CONTRAST  Result Date: 10/24/2022 CLINICAL DATA:  Acute abdominal pain EXAM: CT ABDOMEN AND PELVIS WITH CONTRAST TECHNIQUE: Multidetector CT imaging of the abdomen and pelvis was performed using the standard protocol following bolus administration of intravenous contrast. RADIATION DOSE REDUCTION: This exam was performed according to the departmental dose-optimization program which includes automated exposure  control, adjustment of the mA and/or kV according to patient size and/or use of iterative reconstruction technique. CONTRAST:  88m OMNIPAQUE IOHEXOL 350 MG/ML SOLN COMPARISON:  12/14/2017 FINDINGS: Lower chest: Patchy basilar atelectasis is noted. No sizable effusion is seen. Hepatobiliary: Fatty infiltration of the liver is noted. The gallbladder has been surgically removed. Pancreas: Unremarkable. No pancreatic ductal dilatation or surrounding inflammatory changes. Spleen: Normal in size without focal abnormality. Adrenals/Urinary Tract: Adrenal glands are within normal limits. Kidneys demonstrate prominent extrarenal pelves without obstructive change. No calculi are noted. The bladder is well distended. Stomach/Bowel: Findings suspicious for rectal prolapse are noted. Diverticulosis is seen. There is a large pericolonic collection of fecal material which may represent a large diverticulum or possibly pericolonic abscess. This is larger than that noted in 2019 and currently measures approximately 4.7 cm in greatest dimension. Pericolonic inflammatory change consistent with diverticulitis is noted. The appendix has been surgically removed. Small bowel and stomach are within normal limits. Vascular/Lymphatic: Aortic atherosclerosis. No enlarged abdominal or pelvic lymph nodes. Reproductive: Fluid attenuation is noted within the endometrial cavity of uncertain significance. This is somewhat suspicious given the patient's age. No adnexal mass is noted. Other: No abdominal wall hernia or abnormality.  No abdominopelvic ascites. Musculoskeletal: No acute or significant osseous findings. IMPRESSION: Well-defined collection of fecal material adjacent to the colon with surrounding inflammatory change. This likely represents a large diverticulum given its chronic nature with superimposed diverticulitis on the current exam. Fluid attenuation within the endometrial canal somewhat suspicious given the patient's age. Further  workup is necessary. Findings suggestive of rectal prolapse Fatty liver. Electronically Signed   By: Inez Catalina M.D.   On: 10/24/2022 21:56   DG Abdomen 1 View  Result Date: 10/24/2022 CLINICAL DATA:  Constipation EXAM: ABDOMEN - 1 VIEW COMPARISON:  None Available. FINDINGS: Moderate fecal loading in the colon. No bowel obstruction. An oval collection of air in the mid abdomen is likely in the stomach. No other bony or soft tissue abnormalities are identified. IMPRESSION: Moderate fecal loading in the colon. No other abnormalities. Electronically Signed   By: Dorise Bullion III M.D.   On: 10/24/2022 14:54    Current Medications, Allergies, Past Medical History, Past Surgical History, Family History and Social History were reviewed in [employer] record.   Physical Exam: General: Well developed, well nourished, no acute distress Head: Normocephalic and atraumatic Eyes: Sclerae anicteric, EOMI Ears: Normal auditory acuity Mouth: No deformities or lesions noted Lungs: Clear throughout to auscultation Heart: Regular rate and rhythm; No murmurs, rubs or bruits Abdomen: Soft, non tender and non distended. No masses, hepatosplenomegaly or hernias noted. Normal Bowel sounds Rectal: Not done Musculoskeletal: Symmetrical with no gross deformities  Pulses:  Normal pulses noted Extremities: No edema or deformities noted Neurological: Alert oriented x 4, grossly nonfocal Psychological:  Alert and cooperative. Normal mood and affect   Darcey Demma T. Fuller Plan, MD 11/15/2022, 2:47 PM

## 2022-11-15 NOTE — Telephone Encounter (Signed)
[place] Name: Annett Gula, [place] Name: Isaac Laud Phone #: [phone]  Service Requested: VO for ST eval next week, ok to move eval Frequency of Visits: pt was due for ST Eval today but requested to reschedule due to another doctor appointment

## 2022-11-15 NOTE — Telephone Encounter (Signed)
Noted  

## 2022-11-15 NOTE — Telephone Encounter (Signed)
Called and spoke with  Pharmacist Cristie Hem  he said that it has been order and hasn't come as of yet he said that he has one more delivery today if doesn't come in today then he will call us back to let know that he is unable to get medicine

## 2022-11-15 NOTE — Patient Instructions (Signed)
Discontinue colestipol.   Restart dicyclomine 10 mg twice daily.   Stop taking your large dry pills that make you choke.   Follow up with your primary care physician to discuss alternates to those pills.  The [place] [facility] providers would like to encourage you to use [facility] to communicate with providers for non-urgent requests or questions.  Due to long hold times on the telephone, sending your provider a message by [facility] may be a faster and more efficient way to get a response.  Please allow 48 business hours for a response.  Please remember that this is for non-urgent requests.   Thank you for choosing me and [facility].  Pricilla Riffle. Dagoberto Ligas., MD., Marval Regal

## 2022-11-17 DIAGNOSIS — E119 Type 2 diabetes mellitus without complications: Secondary | ICD-10-CM | POA: Diagnosis not present

## 2022-11-17 DIAGNOSIS — K5792 Diverticulitis of intestine, part unspecified, without perforation or abscess without bleeding: Secondary | ICD-10-CM | POA: Diagnosis not present

## 2022-11-17 DIAGNOSIS — Z79891 Long term (current) use of opiate analgesic: Secondary | ICD-10-CM | POA: Diagnosis not present

## 2022-11-17 DIAGNOSIS — K580 Irritable bowel syndrome with diarrhea: Secondary | ICD-10-CM | POA: Diagnosis not present

## 2022-11-17 DIAGNOSIS — K581 Irritable bowel syndrome with constipation: Secondary | ICD-10-CM | POA: Diagnosis not present

## 2022-11-17 DIAGNOSIS — I482 Chronic atrial fibrillation, unspecified: Secondary | ICD-10-CM | POA: Diagnosis not present

## 2022-11-22 DIAGNOSIS — K580 Irritable bowel syndrome with diarrhea: Secondary | ICD-10-CM | POA: Diagnosis not present

## 2022-11-22 DIAGNOSIS — I482 Chronic atrial fibrillation, unspecified: Secondary | ICD-10-CM | POA: Diagnosis not present

## 2022-11-22 DIAGNOSIS — E119 Type 2 diabetes mellitus without complications: Secondary | ICD-10-CM | POA: Diagnosis not present

## 2022-11-22 DIAGNOSIS — K581 Irritable bowel syndrome with constipation: Secondary | ICD-10-CM | POA: Diagnosis not present

## 2022-11-22 DIAGNOSIS — K5792 Diverticulitis of intestine, part unspecified, without perforation or abscess without bleeding: Secondary | ICD-10-CM | POA: Diagnosis not present

## 2022-11-22 DIAGNOSIS — Z79891 Long term (current) use of opiate analgesic: Secondary | ICD-10-CM | POA: Diagnosis not present

## 2022-11-30 DIAGNOSIS — K580 Irritable bowel syndrome with diarrhea: Secondary | ICD-10-CM | POA: Diagnosis not present

## 2022-11-30 DIAGNOSIS — K5792 Diverticulitis of intestine, part unspecified, without perforation or abscess without bleeding: Secondary | ICD-10-CM | POA: Diagnosis not present

## 2022-11-30 DIAGNOSIS — E119 Type 2 diabetes mellitus without complications: Secondary | ICD-10-CM | POA: Diagnosis not present

## 2022-11-30 DIAGNOSIS — K581 Irritable bowel syndrome with constipation: Secondary | ICD-10-CM | POA: Diagnosis not present

## 2022-11-30 DIAGNOSIS — I482 Chronic atrial fibrillation, unspecified: Secondary | ICD-10-CM | POA: Diagnosis not present

## 2022-11-30 DIAGNOSIS — Z79891 Long term (current) use of opiate analgesic: Secondary | ICD-10-CM | POA: Diagnosis not present

## 2022-12-04 DIAGNOSIS — Z79891 Long term (current) use of opiate analgesic: Secondary | ICD-10-CM | POA: Diagnosis not present

## 2022-12-04 DIAGNOSIS — R1312 Dysphagia, oropharyngeal phase: Secondary | ICD-10-CM | POA: Diagnosis not present

## 2022-12-04 DIAGNOSIS — K5792 Diverticulitis of intestine, part unspecified, without perforation or abscess without bleeding: Secondary | ICD-10-CM | POA: Diagnosis not present

## 2022-12-04 DIAGNOSIS — K580 Irritable bowel syndrome with diarrhea: Secondary | ICD-10-CM | POA: Diagnosis not present

## 2022-12-04 DIAGNOSIS — K581 Irritable bowel syndrome with constipation: Secondary | ICD-10-CM | POA: Diagnosis not present

## 2022-12-04 DIAGNOSIS — E119 Type 2 diabetes mellitus without complications: Secondary | ICD-10-CM | POA: Diagnosis not present

## 2022-12-04 DIAGNOSIS — I482 Chronic atrial fibrillation, unspecified: Secondary | ICD-10-CM | POA: Diagnosis not present

## 2022-12-06 DIAGNOSIS — E119 Type 2 diabetes mellitus without complications: Secondary | ICD-10-CM | POA: Diagnosis not present

## 2022-12-06 DIAGNOSIS — K581 Irritable bowel syndrome with constipation: Secondary | ICD-10-CM | POA: Diagnosis not present

## 2022-12-06 DIAGNOSIS — Z79891 Long term (current) use of opiate analgesic: Secondary | ICD-10-CM | POA: Diagnosis not present

## 2022-12-06 DIAGNOSIS — R1312 Dysphagia, oropharyngeal phase: Secondary | ICD-10-CM | POA: Diagnosis not present

## 2022-12-06 DIAGNOSIS — K5792 Diverticulitis of intestine, part unspecified, without perforation or abscess without bleeding: Secondary | ICD-10-CM | POA: Diagnosis not present

## 2022-12-06 DIAGNOSIS — K580 Irritable bowel syndrome with diarrhea: Secondary | ICD-10-CM | POA: Diagnosis not present

## 2022-12-06 DIAGNOSIS — I482 Chronic atrial fibrillation, unspecified: Secondary | ICD-10-CM | POA: Diagnosis not present

## 2022-12-07 ENCOUNTER — Telehealth: Payer: Self-pay

## 2022-12-07 NOTE — Telephone Encounter (Signed)
  CLINICAL USE BELOW THIS LINE (use X to signify action taken)  ___ Form received and placed in providers office for signature. _x__ Form completed and faxed to LOA Dept.  ___ Form completed & LVM to notify patient ready for pick up.  __x_ Charge sheet and copy of form in front office folder for office supervisor.

## 2022-12-08 ENCOUNTER — Ambulatory Visit (INDEPENDENT_AMBULATORY_CARE_PROVIDER_SITE_OTHER): Payer: Medicare Other

## 2022-12-08 VITALS — Ht <= 58 in | Wt 120.0 lb

## 2022-12-08 DIAGNOSIS — Z0000 Encounter for general adult medical examination without abnormal findings: Secondary | ICD-10-CM | POA: Diagnosis not present

## 2022-12-08 NOTE — Patient Instructions (Signed)
Angel Holland , Thank you for taking time to come for your Medicare Wellness Visit. I appreciate your ongoing commitment to your health goals. Please review the following plan we discussed and let me know if I can assist you in the future.   These are the goals we discussed:  Goals      Increase physical activity     As you are able; this will help with your chronic back pain     Patient Stated     Decrease fecal incontinence and diarrhea.     Patient Stated     12/08/2022, wants to get cancer removed and make appointment with Ob/Gyn        This is a list of the screening recommended for you and due dates:  Health Maintenance  Topic Date Due   Zoster (Shingles) Vaccine (2 of 2) 11/17/2018   Yearly kidney health urinalysis for diabetes  12/22/2021   COVID-19 Vaccine (7 - 2023-24 season) 09/28/2022   DEXA scan (bone density measurement)  07/11/2025*   Eye exam for diabetics  02/28/2023   Hemoglobin A1C  05/14/2023   Mammogram  05/30/2023   Complete foot exam   07/29/2023   Yearly kidney function blood test for diabetes  11/14/2023   Medicare Annual Wellness Visit  12/09/2023   DTaP/Tdap/Td vaccine (3 - Td or Tdap) 11/02/2024   Pneumonia Vaccine  Completed   Flu Shot  Completed   HPV Vaccine  Aged Out  *Topic was postponed. The date shown is not the original due date.    Advanced directives: Please bring a copy of your POA (Power of Attorney) and/or Living Will to your next appointment.   Conditions/risks identified: none  Next appointment: Follow up in one year for your annual wellness visit    Preventive Care 65 Years and Older, Female Preventive care refers to lifestyle choices and visits with your health care provider that can promote health and wellness. What does preventive care include? A yearly physical exam. This is also called an annual well check. Dental exams once or twice a year. Routine eye exams. Ask your health care provider how often you should have your eyes  checked. Personal lifestyle choices, including: Daily care of your teeth and gums. Regular physical activity. Eating a healthy diet. Avoiding tobacco and drug use. Limiting alcohol use. Practicing safe sex. Taking low-dose aspirin every day. Taking vitamin and mineral supplements as recommended by your health care provider. What happens during an annual well check? The services and screenings done by your health care provider during your annual well check will depend on your age, overall health, lifestyle risk factors, and family history of disease. Counseling  Your health care provider may ask you questions about your: Alcohol use. Tobacco use. Drug use. Emotional well-being. Home and relationship well-being. Sexual activity. Eating habits. History of falls. Memory and ability to understand (cognition). Work and work Statistician. Reproductive health. Screening  You may have the following tests or measurements: Height, weight, and BMI. Blood pressure. Lipid and cholesterol levels. These may be checked every 5 years, or more frequently if you are over 30 years old. Skin check. Lung cancer screening. You may have this screening every year starting at age 30 if you have a 30-pack-year history of smoking and currently smoke or have quit within the past 15 years. Fecal occult blood test (FOBT) of the stool. You may have this test every year starting at age 36. Flexible sigmoidoscopy or colonoscopy. You may have a  sigmoidoscopy every 5 years or a colonoscopy every 10 years starting at age 56. Hepatitis C blood test. Hepatitis B blood test. Sexually transmitted disease (STD) testing. Diabetes screening. This is done by checking your blood sugar (glucose) after you have not eaten for a while (fasting). You may have this done every 1-3 years. Bone density scan. This is done to screen for osteoporosis. You may have this done starting at age 21. Mammogram. This may be done every 1-2  years. Talk to your health care provider about how often you should have regular mammograms. Talk with your health care provider about your test results, treatment options, and if necessary, the need for more tests. Vaccines  Your health care provider may recommend certain vaccines, such as: Influenza vaccine. This is recommended every year. Tetanus, diphtheria, and acellular pertussis (Tdap, Td) vaccine. You may need a Td booster every 10 years. Zoster vaccine. You may need this after age 35. Pneumococcal 13-valent conjugate (PCV13) vaccine. One dose is recommended after age 30. Pneumococcal polysaccharide (PPSV23) vaccine. One dose is recommended after age 27. Talk to your health care provider about which screenings and vaccines you need and how often you need them. This information is not intended to replace advice given to you by your health care provider. Make sure you discuss any questions you have with your health care provider. Document Released: 11/12/2015 Document Revised: 07/05/2016 Document Reviewed: 08/17/2015 Elsevier Interactive Patient Education  2017 [employer] Prevention in the Home Falls can cause injuries. They can happen to people of all ages. There are many things you can do to make your home safe and to help prevent falls. What can I do on the outside of my home? Regularly fix the edges of walkways and driveways and fix any cracks. Remove anything that might make you trip as you walk through a door, such as a raised step or threshold. Trim any bushes or trees on the path to your home. Use bright outdoor lighting. Clear any walking paths of anything that might make someone trip, such as rocks or tools. Regularly check to see if handrails are loose or broken. Make sure that both sides of any steps have handrails. Any raised decks and porches should have guardrails on the edges. Have any leaves, snow, or ice cleared regularly. Use sand or salt on walking paths  during winter. Clean up any spills in your garage right away. This includes oil or grease spills. What can I do in the bathroom? Use night lights. Install grab bars by the toilet and in the tub and shower. Do not use towel bars as grab bars. Use non-skid mats or decals in the tub or shower. If you need to sit down in the shower, use a plastic, non-slip stool. Keep the floor dry. Clean up any water that spills on the floor as soon as it happens. Remove soap buildup in the tub or shower regularly. Attach bath mats securely with double-sided non-slip rug tape. Do not have throw rugs and other things on the floor that can make you trip. What can I do in the bedroom? Use night lights. Make sure that you have a light by your bed that is easy to reach. Do not use any sheets or blankets that are too big for your bed. They should not hang down onto the floor. Have a firm chair that has side arms. You can use this for support while you get dressed. Do not have throw rugs and other  things on the floor that can make you trip. What can I do in the kitchen? Clean up any spills right away. Avoid walking on wet floors. Keep items that you use a lot in easy-to-reach places. If you need to reach something above you, use a strong step stool that has a grab bar. Keep electrical cords out of the way. Do not use floor polish or wax that makes floors slippery. If you must use wax, use non-skid floor wax. Do not have throw rugs and other things on the floor that can make you trip. What can I do with my stairs? Do not leave any items on the stairs. Make sure that there are handrails on both sides of the stairs and use them. Fix handrails that are broken or loose. Make sure that handrails are as long as the stairways. Check any carpeting to make sure that it is firmly attached to the stairs. Fix any carpet that is loose or worn. Avoid having throw rugs at the top or bottom of the stairs. If you do have throw  rugs, attach them to the floor with carpet tape. Make sure that you have a light switch at the top of the stairs and the bottom of the stairs. If you do not have them, ask someone to add them for you. What else can I do to help prevent falls? Wear shoes that: Do not have high heels. Have rubber bottoms. Are comfortable and fit you well. Are closed at the toe. Do not wear sandals. If you use a stepladder: Make sure that it is fully opened. Do not climb a closed stepladder. Make sure that both sides of the stepladder are locked into place. Ask someone to hold it for you, if possible. Clearly mark and make sure that you can see: Any grab bars or handrails. First and last steps. Where the edge of each step is. Use tools that help you move around (mobility aids) if they are needed. These include: Canes. Walkers. Scooters. Crutches. Turn on the lights when you go into a dark area. Replace any light bulbs as soon as they burn out. Set up your furniture so you have a clear path. Avoid moving your furniture around. If any of your floors are uneven, fix them. If there are any pets around you, be aware of where they are. Review your medicines with your doctor. Some medicines can make you feel dizzy. This can increase your chance of falling. Ask your doctor what other things that you can do to help prevent falls. This information is not intended to replace advice given to you by your health care provider. Make sure you discuss any questions you have with your health care provider. Document Released: 08/12/2009 Document Revised: 03/23/2016 Document Reviewed: 11/20/2014 Elsevier Interactive Patient Education  2017 [employer].

## 2022-12-08 NOTE — Progress Notes (Signed)
I connected with Angel Holland today by telephone and verified that I am speaking with the correct person using two identifiers. Location patient: home Location provider: work Persons participating in the virtual visit: Angel Holland, Glenna Durand LPN.   I discussed the limitations, risks, security and privacy concerns of performing an evaluation and management service by telephone and the availability of in person appointments. I also discussed with the patient that there may be a patient responsible charge related to this service. The patient expressed understanding and verbally consented to this telephonic visit.    Interactive audio and video telecommunications were attempted between this provider and patient, however failed, due to patient having technical difficulties OR patient did not have access to video capability.  We continued and completed visit with audio only.     Vital signs may be patient reported or missing.  Subjective:   Angel Holland is a 82 y.o. female who presents for Medicare Annual (Subsequent) preventive examination.  Review of Systems     Cardiac Risk Factors include: advanced age (>88mn, >>35women);diabetes mellitus;dyslipidemia     Objective:    Today's Vitals   12/08/22 1057  Weight: 120 lb (54.4 kg)  Height: 4' 10"$  (1.473 m)  PainSc: 4    Body mass index is 25.08 kg/m.     12/08/2022   11:08 AM 12/05/2021    3:08 PM 12/02/2020    3:02 PM 01/30/2020    3:56 PM 12/01/2019   10:15 AM 12/19/2017    9:38 AM 09/09/2015   10:38 AM  Advanced Directives  Does Patient Have a Medical Advance Directive? Yes Yes Yes No Yes Yes Yes  Type of AParamedicof AMarionLiving will [place]lower HillLiving will [place]Living will  [place]Living will [place]Walker ValleyLiving will Living will;Healthcare Power of Attorney  Does patient want to make changes to medical advance directive?  No -  Patient declined No - Patient declined  No - Patient declined  No - Patient declined  Copy of H[place]in Chart? No - copy requested No - copy requested No - copy requested  No - copy requested No - copy requested No - copy requested  Would patient like information on creating a medical advance directive?    No - Patient declined       Current Medications (verified) Outpatient Encounter Medications as of 12/08/2022  Medication Sig   AMBULATORY NON FORMULARY MEDICATION Take 2 capsules by mouth daily. Medication Name: Osteo Matrix-Shaklee   Ascorbic Acid (VITAMIN C PO) Take 1,500 mg by mouth 2 (two) times daily with a meal.   aspirin EC 81 MG tablet Take 81 mg by mouth See admin instructions. Take one tablet (81 mg) by mouth every morning, may also take one tablet (81 mg) at night as needed for pain/sleep   Cyanocobalamin (VITAMIN B-12 PO) Take 130 mg by mouth daily with supper.   dicyclomine (BENTYL) 10 MG capsule TAKE 1 CAPSULE BY MOUTH FOUR TIMES DAILY BEFORE MEALS AND AT BEDTIME   diltiazem (CARDIZEM CD) 120 MG 24 hr capsule Take 1 capsule (120 mg total) by mouth daily.   hydrocodone-ibuprofen (VICOPROFEN) 5-200 MG tablet Take 1 tablet by mouth daily as needed for pain. (Patient taking differently: Take 1 tablet by mouth at bedtime as needed for pain (for back pain, which helps with insomnia).)   metroNIDAZOLE (METROGEL) 0.75 % gel Apply 1 application topically every morning. Applies to face  for rosacea   triamcinolone (NASACORT) 55 MCG/ACT AERO nasal inhaler Place 2 sprays into the nose daily as needed (congestion/sinus headache).   VITAMIN D PO Take 1,000 Units by mouth daily with breakfast.   Wheat Dextrin (BENEFIBER DRINK MIX PO) Take 15 mLs by mouth 2 (two) times daily. Takes once every other day   colestipol (COLESTID) 5 g packet Take 3 g by mouth daily. 1/2 packet daily. (Patient not taking: Reported on 11/13/2022)   fluconazole (DIFLUCAN) 150 MG tablet Take 1 tablet  (150 mg total) by mouth once a week. Take 1 tablet after finishing your antibiotic, you can take a second one in 3 days if needed (Patient not taking: Reported on 12/08/2022)   loperamide (IMODIUM) 2 MG capsule Take 2 mg by mouth daily as needed for diarrhea or loose stools.  (Patient not taking: Reported on 11/13/2022)   Multiple Vitamins-Minerals (OCUVITE PO) Take 1 tablet by mouth at bedtime. (Patient not taking: Reported on 12/08/2022)   No facility-administered encounter medications on file as of 12/08/2022.    Allergies (verified) Biaxin [clarithromycin], Codeine, Decongestant [pseudoephedrine hcl er], Phenylephrine, Rosuvastatin, and Acetaminophen   History: Past Medical History:  Diagnosis Date   Anxiety    no meds   Arthritis    back and fingers    Bruxism    Cancer ([place])    Cataract    baby cataracts per pt.   Colon polyps    DDD (degenerative disc disease), lumbar    DDD (degenerative disc disease), lumbosacral    Depression    no meds   Diverticulosis of colon (without mention of hemorrhage)    Fibroid uterus    Headache, paroxysmal hemicrania, episodic 03/24/2014   Hemorrhoids    History of D&C    x2   History of night sweats    History of rectal bleeding    08/2017   Hx of diverticulitis of colon 03/30/2014   Hyperglycemia    Irritable bowel syndrome    PAF (paroxysmal atrial fibrillation) ([place]) 01/2018   pt declined [facility]   Pulmonary nodules - reports followed by pulmonology, reports following up with them in June 2015 03/26/2013   Repeat CT chest 04/20/14 > 1. Scattered pulmonary nodules measure 4 mm or less in size, are unchanged from 03/28/2013 and are therefore considered benign     Rosacea    Tobacco abuse    Unspecified hemorrhoids without mention of complication    Past Surgical History:  Procedure Laterality Date   APPENDECTOMY  1957   bce     CATARACT EXTRACTION     Left June 2021-Right 05/2020   CATARACT EXTRACTION, BILATERAL     CHOLECYSTECTOMY   2009   DILATION AND CURETTAGE OF UTERUS     fibroid removed from uterus     HYSTEROSCOPY WITH D & C N/A 09/09/2015   Procedure: DILATATION AND CURETTAGE /HYSTEROSCOPY;  Surgeon: Vanessa Kick, MD;  Location: [facility];  Service: Gynecology;  Laterality: N/A;   Family History  Problem Relation Age of Onset   Heart disease Mother        CHF   Diverticulosis Mother    Bone cancer Father        found in his mouth first   Colon cancer Neg Hx    Stomach cancer Neg Hx    Esophageal cancer Neg Hx    Pancreatic cancer Neg Hx    Colon polyps Neg Hx    Social History   Socioeconomic History  Marital status: Widowed    Spouse name: Not on file   Number of children: 2   Years of education: Not on file   Highest education level: Some college, no degree  Occupational History   Occupation: retired    Comment: Press photographer  Tobacco Use   Smoking status: Former    Packs/day: 1.00    Years: 50.00    Total pack years: 50.00    Types: Cigarettes    Quit date: 07/23/2008    Years since quitting: 14.3   Smokeless tobacco: Never  Vaping Use   Vaping Use: Never used  Substance and Sexual Activity   Alcohol use: No   Drug use: No   Sexual activity: Not Currently  Other Topics Concern   Not on file  Social History Narrative            Social Determinants of Health   Financial Resource Strain: Low Risk  (12/08/2022)   Overall Financial Resource Strain (CARDIA)    Difficulty of Paying Living Expenses: Not hard at all  Food Insecurity: No Food Insecurity (12/08/2022)   Hunger Vital Sign    Worried About Running Out of Food in the Last Year: Never true    [place] in the Last Year: Never true  Transportation Needs: No Transportation Needs (12/08/2022)   PRAPARE - Hydrologist (Medical): No    Lack of Transportation (Non-Medical): No  Physical Activity: Inactive (12/08/2022)   Exercise Vital Sign    Days of Exercise per Week: 0 days    Minutes of Exercise per  Session: 0 min  Stress: No Stress Concern Present (12/08/2022)   [place]    Feeling of Stress : Not at all  Social Connections: Moderately Integrated (12/05/2021)   Social Connection and Isolation Panel [NHANES]    Frequency of Communication with Friends and Family: Once a week    Frequency of Social Gatherings with Friends and Family: More than three times a week    Attends Religious Services: More than 4 times per year    Active Member of [employer] or Organizations: Yes    Attends Archivist Meetings: More than 4 times per year    Marital Status: Widowed    Tobacco Counseling Counseling given: Not Answered   Clinical Intake:  Pre-visit preparation completed: Yes  Pain : 0-10 Pain Score: 4  Pain Type: Chronic pain Pain Location: Back Pain Orientation: Lower Pain Descriptors / Indicators: Aching Pain Onset: More than a month ago Pain Frequency: Constant     Nutritional Status: BMI 25 -29 Overweight Nutritional Risks: None Diabetes: Yes  How often do you need to have someone help you when you read instructions, pamphlets, or other written materials from your doctor or pharmacy?: 1 - Never  Diabetic? Yes Nutrition Risk Assessment:  Has the patient had any N/V/D within the last 2 months?  No  Does the patient have any non-healing wounds?  No  Has the patient had any unintentional weight loss or weight gain?  No   Diabetes:  Is the patient diabetic?  Yes  If diabetic, was a CBG obtained today?  No  Did the patient bring in their glucometer from home?  No  How often do you monitor your CBG's? Does not.   Financial Strains and Diabetes Management:  Are you having any financial strains with the device, your supplies or your medication? No .  Does the  patient want to be seen by Chronic Care Management for management of their diabetes?  No  Would the patient like to be referred to a Nutritionist or  for Diabetic Management?  No   Diabetic Exams:  Diabetic Eye Exam: Completed 02/27/2022 Diabetic Foot Exam: Completed 07/28/2022   Interpreter Needed?: No  Information entered by :: NAllen LPN   Activities of Daily Living    12/08/2022   11:11 AM  In your present state of health, do you have any difficulty performing the following activities:  Hearing? 0  Vision? 0  Difficulty concentrating or making decisions? 0  Walking or climbing stairs? 1  Dressing or bathing? 0  Doing errands, shopping? 0  Preparing Food and eating ? N  Using the Toilet? N  In the past six months, have you accidently leaked urine? Y  Do you have problems with loss of bowel control? N  Managing your Medications? N  Managing your Finances? N  Housekeeping or managing your Housekeeping? N    Patient Care Team: Charyl Dancer, NP as PCP - General (Internal Medicine) Sydnee Levans, MD as Referring Physician (Dermatology)  Indicate any recent Medical Services you may have received from other than [facility] providers in the past year (date may be approximate).     Assessment:   This is a routine wellness examination for Angel Holland.  Hearing/Vision screen Vision Screening - Comments:: Regular eye exams, [facility]  Dietary issues and exercise activities discussed: Current Exercise Habits: The patient does not participate in regular exercise at present   Goals Addressed             This Visit's Progress    Patient Stated       12/08/2022, wants to get cancer removed and make appointment with Ob/Gyn       Depression Screen    12/08/2022   11:10 AM 12/05/2021    2:52 PM 12/02/2020    3:06 PM 08/11/2019    9:00 AM 07/22/2018    8:16 AM 07/17/2017    8:14 AM 07/13/2016    8:13 AM  PHQ 2/9 Scores  PHQ - 2 Score 0 0 0 0 0 3 3  PHQ- 9 Score   0   11     Fall Risk    12/08/2022   11:09 AM 11/13/2022    9:51 AM 12/05/2021    3:00 PM 12/02/2020    3:03 PM 12/01/2019   10:13 AM  Fall Risk   Falls in the  past year? 0 0 0 1 0  Number falls in past yr: 0  0 0   Injury with Fall? 0  0 1   Comment    Bruised breast   Risk for fall due to : Medication side effect No Fall Risks No Fall Risks Medication side effect Medication side effect;Orthopedic patient  Follow up Falls prevention discussed;Education provided;Falls evaluation completed Falls evaluation completed  Falls evaluation completed;Falls prevention discussed Falls evaluation completed;Education provided;Falls prevention discussed    FALL RISK PREVENTION PERTAINING TO THE HOME:  Any stairs in or around the home? No  If so, are there any without handrails? N/a Home free of loose throw rugs in walkways, pet beds, electrical cords, etc? Yes  Adequate lighting in your home to reduce risk of falls? Yes   ASSISTIVE DEVICES UTILIZED TO PREVENT FALLS:  Life alert? Yes  Use of a cane, walker or w/c? Yes  Grab bars in the bathroom? No  Shower chair or bench  in shower? No  Elevated toilet seat or a handicapped toilet? No   TIMED UP AND GO:  Was the test performed? No .      Cognitive Function:        12/08/2022   11:12 AM 12/05/2021    3:01 PM 12/01/2019   10:12 AM  6CIT Screen  What Year? 0 points 0 points 0 points  What month? 0 points 0 points 0 points  What time? 0 points 0 points 0 points  Count back from 20 0 points 0 points 0 points  Months in reverse 0 points 0 points 0 points  Repeat phrase 0 points 0 points 0 points  Total Score 0 points 0 points 0 points    Immunizations Immunization History  Administered Date(s) Administered   Fluad Quad(high Dose 65+) 07/31/2019, 07/21/2020, 07/27/2021, 07/28/2022   Influenza Split 06/30/2012   Influenza Whole 10/30/2006, 07/20/2010   Influenza, High Dose Seasonal PF 07/12/2015, 07/13/2016, 07/17/2017   Influenza,inj,Quad PF,6+ Mos 07/24/2013, 07/22/2018   Influenza,inj,quad, With Preservative 07/30/2020   Influenza-Unspecified 06/30/2014   PFIZER Comirnaty(Gray Top)Covid-19  Tri-Sucrose Vaccine 04/06/2021   PFIZER(Purple Top)SARS-COV-2 Vaccination 12/26/2019, 01/20/2020, 08/18/2020   Pfizer Covid-19 Vaccine Bivalent Booster 65yr & up 08/01/2021, 08/03/2022   Pneumococcal Conjugate-13 07/12/2015   Pneumococcal Polysaccharide-23 10/30/2004, 07/24/2013   Td 10/30/2004   Tdap 11/02/2014   Zoster Recombinat (Shingrix) 09/22/2018   Zoster, Live 11/02/2014    TDAP status: Up to date  Flu Vaccine status: Up to date  Pneumococcal vaccine status: Up to date  Covid-19 vaccine status: Completed vaccines  Qualifies for Shingles Vaccine? Yes   Zostavax completed Yes   Shingrix Completed?: Yes  Screening Tests Health Maintenance  Topic Date Due   Zoster Vaccines- Shingrix (2 of 2) 11/17/2018   Diabetic kidney evaluation - Urine ACR  12/22/2021   COVID-19 Vaccine (7 - 2023-24 season) 09/28/2022   Medicare Annual Wellness (AWV)  12/05/2022   DEXA SCAN  07/11/2025 (Originally 10/09/2005)   OPHTHALMOLOGY EXAM  02/28/2023   HEMOGLOBIN A1C  05/14/2023   MAMMOGRAM  05/30/2023   FOOT EXAM  07/29/2023   Diabetic kidney evaluation - eGFR measurement  11/14/2023   DTaP/Tdap/Td (3 - Td or Tdap) 11/02/2024   Pneumonia Vaccine 82 Years old  Completed   INFLUENZA VACCINE  Completed   HPV VACCINES  Aged Out    Health Maintenance  Health Maintenance Due  Topic Date Due   Zoster Vaccines- Shingrix (2 of 2) 11/17/2018   Diabetic kidney evaluation - Urine ACR  12/22/2021   COVID-19 Vaccine (7 - 2023-24 season) 09/28/2022   Medicare Annual Wellness (AWV)  12/05/2022    Colorectal cancer screening: No longer required.   Mammogram status: Completed 05/29/2022. Repeat every year  Bone Density status: decline  Lung Cancer Screening: (Low Dose CT Chest recommended if Age 83-80years, 30 pack-year currently smoking OR have quit w/in 15years.) does not qualify.   Lung Cancer Screening Referral: no  Additional Screening:  Hepatitis C Screening: does not  qualify;  Vision Screening: Recommended annual ophthalmology exams for early detection of glaucoma and other disorders of the eye. Is the patient up to date with their annual eye exam?  Yes  Who is the provider or what is the name of the office in which the patient attends annual eye exams? [facility] If pt is not established with a provider, would they like to be referred to a provider to establish care? No .   Dental Screening: Recommended annual  dental exams for proper oral hygiene  Community Resource Referral / Chronic Care Management: CRR required this visit?  No   CCM required this visit?  No      Plan:     I have personally reviewed and noted the following in the patient's chart:   Medical and social history Use of alcohol, tobacco or illicit drugs  Current medications and supplements including opioid prescriptions. Patient is currently taking opioid prescriptions. Information provided to patient regarding non-opioid alternatives. Patient advised to discuss non-opioid treatment plan with their provider. Functional ability and status Nutritional status Physical activity Advanced directives List of other physicians Hospitalizations, surgeries, and ER visits in previous 12 months Vitals Screenings to include cognitive, depression, and falls Referrals and appointments  In addition, I have reviewed and discussed with patient certain preventive protocols, quality metrics, and best practice recommendations. A written personalized care plan for preventive services as well as general preventive health recommendations were provided to patient.     Kellie Simmering, LPN   [id number]   Nurse Notes: none  Due to this being a virtual visit, the after visit summary with patients personalized plan was offered to patient via mail or my-chart.  to pick up at office at next visit

## 2022-12-10 NOTE — Progress Notes (Signed)
   Established Patient Office Visit  Subjective   Patient ID: Angel Holland, female    DOB: 1941-08-24  Age: 82 y.o. MRN: [id number]  No chief complaint on file.   HPI  Angel Holland is here to follow-up on diarrhea.   {History (Optional):23778}  ROS    Objective:     There were no vitals taken for this visit. {Vitals History (Optional):23777}  Physical Exam   No results found for any visits on 12/11/22.  {Labs (Optional):23779}  The ASCVD Risk score (Arnett DK, et al., 2019) failed to calculate for the following reasons:   The 2019 ASCVD risk score is only valid for ages 67 to 106    Assessment & Plan:   Problem List Items Addressed This Visit   None   No follow-ups on file.    Charyl Dancer, NP

## 2022-12-11 ENCOUNTER — Encounter: Payer: Self-pay | Admitting: Nurse Practitioner

## 2022-12-11 ENCOUNTER — Ambulatory Visit (INDEPENDENT_AMBULATORY_CARE_PROVIDER_SITE_OTHER): Payer: Medicare Other | Admitting: Nurse Practitioner

## 2022-12-11 VITALS — BP 104/70 | HR 76 | Temp 98.1°F | Ht <= 58 in | Wt 122.4 lb

## 2022-12-11 DIAGNOSIS — R197 Diarrhea, unspecified: Secondary | ICD-10-CM

## 2022-12-11 DIAGNOSIS — E119 Type 2 diabetes mellitus without complications: Secondary | ICD-10-CM

## 2022-12-11 DIAGNOSIS — N858 Other specified noninflammatory disorders of uterus: Secondary | ICD-10-CM | POA: Diagnosis not present

## 2022-12-11 NOTE — Assessment & Plan Note (Signed)
Symptoms are improving.  She has started taking the Benefiber every other day instead of twice a day.  She has not had any episodes of incontinence or trouble with abdominal pain in the past week.

## 2022-12-11 NOTE — Patient Instructions (Addendum)
It was great to see you!  Keep the appointment with your dermatologist on Wednesday.  Call the OB/GYN back to schedule an appointment when you are able.   Call your insurance and see if they offer rides to your doctor's appointments.   Let's follow-up in 3 months, sooner if you have concerns.  If a referral was placed today, you will be contacted for an appointment. Please note that routine referrals can sometimes take up to 3-4 weeks to process. Please call our office if you haven't heard anything after this time frame.  Take care,  Vance Peper, NP

## 2022-12-11 NOTE — Assessment & Plan Note (Signed)
Chronic, stable. She brought a urine sample today for urine microalbumin. Follow-up in 3 months.

## 2022-12-11 NOTE — Assessment & Plan Note (Addendum)
She had an appointment with GYN but then canceled it when she found out she needs to have surgery for skin cancer removal on her forehead.  Encouraged her to reach back out when able to reschedule this appointment with GYN.

## 2022-12-12 LAB — MICROALBUMIN / CREATININE URINE RATIO
Creatinine,U: 52.7 mg/dL
Microalb Creat Ratio: 1.3 mg/g (ref 0.0–30.0)
Microalb, Ur: <0.7 mg/dL (ref 0.0–1.9)

## 2022-12-13 DIAGNOSIS — C44329 Squamous cell carcinoma of skin of other parts of face: Secondary | ICD-10-CM | POA: Diagnosis not present

## 2022-12-13 DIAGNOSIS — Z85828 Personal history of other malignant neoplasm of skin: Secondary | ICD-10-CM | POA: Diagnosis not present

## 2022-12-14 DIAGNOSIS — E119 Type 2 diabetes mellitus without complications: Secondary | ICD-10-CM | POA: Diagnosis not present

## 2022-12-14 DIAGNOSIS — Z79891 Long term (current) use of opiate analgesic: Secondary | ICD-10-CM | POA: Diagnosis not present

## 2022-12-14 DIAGNOSIS — K5792 Diverticulitis of intestine, part unspecified, without perforation or abscess without bleeding: Secondary | ICD-10-CM | POA: Diagnosis not present

## 2022-12-14 DIAGNOSIS — I482 Chronic atrial fibrillation, unspecified: Secondary | ICD-10-CM | POA: Diagnosis not present

## 2022-12-14 DIAGNOSIS — K580 Irritable bowel syndrome with diarrhea: Secondary | ICD-10-CM | POA: Diagnosis not present

## 2022-12-14 DIAGNOSIS — K581 Irritable bowel syndrome with constipation: Secondary | ICD-10-CM | POA: Diagnosis not present

## 2022-12-27 DIAGNOSIS — N859 Noninflammatory disorder of uterus, unspecified: Secondary | ICD-10-CM | POA: Diagnosis not present

## 2022-12-29 DIAGNOSIS — K580 Irritable bowel syndrome with diarrhea: Secondary | ICD-10-CM | POA: Diagnosis not present

## 2022-12-29 DIAGNOSIS — K581 Irritable bowel syndrome with constipation: Secondary | ICD-10-CM | POA: Diagnosis not present

## 2022-12-29 DIAGNOSIS — I482 Chronic atrial fibrillation, unspecified: Secondary | ICD-10-CM | POA: Diagnosis not present

## 2022-12-29 DIAGNOSIS — K5792 Diverticulitis of intestine, part unspecified, without perforation or abscess without bleeding: Secondary | ICD-10-CM | POA: Diagnosis not present

## 2022-12-29 DIAGNOSIS — Z79891 Long term (current) use of opiate analgesic: Secondary | ICD-10-CM | POA: Diagnosis not present

## 2022-12-29 DIAGNOSIS — E119 Type 2 diabetes mellitus without complications: Secondary | ICD-10-CM | POA: Diagnosis not present

## 2023-01-01 DIAGNOSIS — Z5181 Encounter for therapeutic drug level monitoring: Secondary | ICD-10-CM | POA: Diagnosis not present

## 2023-01-01 DIAGNOSIS — M5416 Radiculopathy, lumbar region: Secondary | ICD-10-CM | POA: Diagnosis not present

## 2023-01-01 DIAGNOSIS — M5459 Other low back pain: Secondary | ICD-10-CM | POA: Diagnosis not present

## 2023-01-01 DIAGNOSIS — Z79899 Other long term (current) drug therapy: Secondary | ICD-10-CM | POA: Diagnosis not present

## 2023-01-02 DIAGNOSIS — N8300 Follicular cyst of ovary, unspecified side: Secondary | ICD-10-CM | POA: Diagnosis not present

## 2023-01-19 ENCOUNTER — Other Ambulatory Visit: Payer: Self-pay | Admitting: Gastroenterology

## 2023-01-22 ENCOUNTER — Other Ambulatory Visit: Payer: Self-pay | Admitting: Gastroenterology

## 2023-01-29 ENCOUNTER — Ambulatory Visit (INDEPENDENT_AMBULATORY_CARE_PROVIDER_SITE_OTHER): Payer: Medicare Other | Admitting: Nurse Practitioner

## 2023-01-29 ENCOUNTER — Encounter: Payer: Self-pay | Admitting: Nurse Practitioner

## 2023-01-29 VITALS — BP 112/78 | HR 70 | Temp 97.6°F | Ht <= 58 in | Wt 122.2 lb

## 2023-01-29 DIAGNOSIS — R5383 Other fatigue: Secondary | ICD-10-CM

## 2023-01-29 DIAGNOSIS — L089 Local infection of the skin and subcutaneous tissue, unspecified: Secondary | ICD-10-CM | POA: Diagnosis not present

## 2023-01-29 LAB — CBC WITH DIFFERENTIAL/PLATELET
Basophils Absolute: 0.1 10*3/uL (ref 0.0–0.1)
Basophils Relative: 0.7 % (ref 0.0–3.0)
Eosinophils Absolute: 0.1 10*3/uL (ref 0.0–0.7)
Eosinophils Relative: 1.1 % (ref 0.0–5.0)
HCT: 39.1 % (ref 36.0–46.0)
Hemoglobin: 13.1 g/dL (ref 12.0–15.0)
Lymphocytes Relative: 16.2 % (ref 12.0–46.0)
Lymphs Abs: 1.2 10*3/uL (ref 0.7–4.0)
MCHC: 33.5 g/dL (ref 30.0–36.0)
MCV: 87.3 fl (ref 78.0–100.0)
Monocytes Absolute: 0.6 10*3/uL (ref 0.1–1.0)
Monocytes Relative: 7.9 % (ref 3.0–12.0)
Neutro Abs: 5.7 10*3/uL (ref 1.4–7.7)
Neutrophils Relative %: 74.1 % (ref 43.0–77.0)
Platelets: 309 10*3/uL (ref 150.0–400.0)
RBC: 4.48 Mil/uL (ref 3.87–5.11)
RDW: 14.6 % (ref 11.5–15.5)
WBC: 7.7 10*3/uL (ref 4.0–10.5)

## 2023-01-29 LAB — COMPREHENSIVE METABOLIC PANEL
ALT: 13 U/L (ref 0–35)
AST: 15 U/L (ref 0–37)
Albumin: 4.3 g/dL (ref 3.5–5.2)
Alkaline Phosphatase: 74 U/L (ref 39–117)
BUN: 16 mg/dL (ref 6–23)
CO2: 26 mEq/L (ref 19–32)
Calcium: 9.7 mg/dL (ref 8.4–10.5)
Chloride: 103 mEq/L (ref 96–112)
Creatinine, Ser: 0.66 mg/dL (ref 0.40–1.20)
GFR: 81.76 mL/min (ref >60.00)
Glucose, Bld: 94 mg/dL (ref 70–99)
Potassium: 4.6 mEq/L (ref 3.5–5.1)
Sodium: 138 mEq/L (ref 135–145)
Total Bilirubin: 0.3 mg/dL (ref 0.2–1.2)
Total Protein: 6.9 g/dL (ref 6.0–8.3)

## 2023-01-29 LAB — TSH: TSH: 1.04 u[IU]/mL (ref 0.35–5.50)

## 2023-01-29 LAB — VITAMIN B12: Vitamin B-12: >1500 pg/mL — ABNORMAL HIGH (ref 211–911)

## 2023-01-29 MED ORDER — CEPHALEXIN 500 MG PO CAPS: 500.0000 mg | ORAL_CAPSULE | Freq: Two times a day (BID) | ORAL | 0 refills | Status: DC

## 2023-01-29 MED ORDER — MELOXICAM 7.5 MG PO TABS: 7.5000 mg | ORAL_TABLET | Freq: Every day | ORAL | 0 refills | Status: DC

## 2023-01-29 MED ORDER — FLUCONAZOLE 150 MG PO TABS: ORAL_TABLET | ORAL | 0 refills | Status: DC

## 2023-01-29 NOTE — Progress Notes (Signed)
Established Patient Office Visit  Subjective   Patient ID: Angel Holland, female    DOB: February 17, 1941  Age: 82 y.o. MRN: [id number]  Chief Complaint  Patient presents with   Discuss Surgery    Hot flashes, left foot-small toe pain    HPI  Angel Holland is here to follow-up on fatigue. She states that she has been feeling increased fatigue in the afternoons from 3p-5pm. This has been going on since her hospitalization in January.  She states that she has a hot flash, body aches, and headache and needs to lay down. She states that she had skin cancer removed on her forehead and was having pain. They gave her tramadol which helped with the pain and also helped with the fatigue. She denies anxiety. She is not sure if this is related to the fluid in her uterus.   She also notes pain in the nail and surrounding area on her left pinky toe. This started a few days ago after trimming her nails. She denies fevers and drainage.     ROS See pertinent positives and negatives per HPI.    Objective:     BP 112/78 (BP Location: Right Arm)   Pulse 70   Temp 97.6 F (36.4 C)   Ht 4\' 10"  (1.473 m)   Wt 122 lb 3.2 oz (55.4 kg)   SpO2 95%   BMI 25.54 kg/m    Physical Exam Vitals and nursing note reviewed.  Constitutional:      General: She is not in acute distress.    Appearance: Normal appearance.  HENT:     Head: Normocephalic.  Eyes:     Conjunctiva/sclera: Conjunctivae normal.  Cardiovascular:     Rate and Rhythm: Normal rate and regular rhythm.     Pulses: Normal pulses.     Heart sounds: Normal heart sounds.  Pulmonary:     Effort: Pulmonary effort is normal.     Breath sounds: Normal breath sounds.  Musculoskeletal:     Cervical back: Normal range of motion.  Skin:    General: Skin is warm.     Findings: Erythema (surrounding left 5th toe nail bed) present.  Neurological:     General: No focal deficit present.     Mental Status: She is alert and oriented to person, place,  and time.  Psychiatric:        Mood and Affect: Mood normal.        Behavior: Behavior normal.        Thought Content: Thought content normal.        Judgment: Judgment normal.      Assessment & Plan:   Problem List Items Addressed This Visit       Other   Fatigue - Primary    She has been experiencing episodes of fatigue, hot flashes, body aches in the afternoons where she needs to lay down.  This has been going on since she was hospitalized with diverticulitis.  She states that she was given tramadol for a recent skin surgery on her forehead and this has helped with the symptoms as well.  Will check CMP, CBC, TSH, vitamin B12, iron panel today.  Will give low-dose of meloxicam 7.5 mg to take daily as needed for pain and symptoms.  Discussed that tramadol is a controlled substance that she is already taking hydrocodone for her chronic pain.  Follow-up based on lab results.      Relevant Orders   CBC with Differential/Platelet  Comprehensive metabolic panel   TSH   Vitamin B12   Iron, TIBC and Ferritin Panel   Other Visit Diagnoses     Skin infection       Surrounding left 5th toe nail, which appears to be growing sideways. Treat with keflex 500mg  BIDx7 days and referral placed to podiatry.   Relevant Medications   cephALEXin (KEFLEX) 500 MG capsule   fluconazole (DIFLUCAN) 150 MG tablet   Other Relevant Orders   Ambulatory referral to Podiatry       Return in about 3 months (around 04/30/2023).    Charyl Dancer, NP

## 2023-01-29 NOTE — Assessment & Plan Note (Signed)
She has been experiencing episodes of fatigue, hot flashes, body aches in the afternoons where she needs to lay down.  This has been going on since she was hospitalized with diverticulitis.  She states that she was given tramadol for a recent skin surgery on her forehead and this has helped with the symptoms as well.  Will check CMP, CBC, TSH, vitamin B12, iron panel today.  Will give low-dose of meloxicam 7.5 mg to take daily as needed for pain and symptoms.  Discussed that tramadol is a controlled substance that she is already taking hydrocodone for her chronic pain.  Follow-up based on lab results.

## 2023-01-29 NOTE — Patient Instructions (Addendum)
It was great to see you!  Start keflex twice a day for 7 days for your toe. I have placed a referral to the foot doctor.   We are going to check some labs today to see what's causing the headaches/hot flashes/fatigue.   You can talk to Dr. Nelva Bush and see if he would be ok taking tramadol once day or adjust the hydrocodone dose.   Start meloxicam once a day in the afternoon as needed.   Let's follow-up in 3 months, sooner if you have concerns.  If a referral was placed today, you will be contacted for an appointment. Please note that routine referrals can sometimes take up to 3-4 weeks to process. Please call our office if you haven't heard anything after this time frame.  Take care,  Vance Peper, NP

## 2023-01-30 LAB — IRON,TIBC AND FERRITIN PANEL
%SAT: 21 % (calc) (ref 16–45)
Ferritin: 90 ng/mL (ref 16–288)
Iron: 57 ug/dL (ref 45–160)
TIBC: 277 mcg/dL (calc) (ref 250–450)

## 2023-02-08 ENCOUNTER — Ambulatory Visit (INDEPENDENT_AMBULATORY_CARE_PROVIDER_SITE_OTHER): Payer: Medicare Other | Admitting: Podiatry

## 2023-02-08 ENCOUNTER — Encounter: Payer: Self-pay | Admitting: Podiatry

## 2023-02-08 DIAGNOSIS — B351 Tinea unguium: Secondary | ICD-10-CM | POA: Diagnosis not present

## 2023-02-08 DIAGNOSIS — M79676 Pain in unspecified toe(s): Secondary | ICD-10-CM | POA: Diagnosis not present

## 2023-02-08 NOTE — Progress Notes (Signed)
Subjective:  Patient ID: Angel Holland, female    DOB: 1941/10/30,  MRN: [id number] HPI Chief Complaint  Patient presents with   Nail Problem    5th toenail left - concerned about the look of the nail and its tender now after wearing a pair of socks that had too much elastic, PCP checked last week and Rx'd an antibiotic-does feel a little better   New Patient (Initial Visit)    82 y.o. female presents with the above complaint.   ROS: Denies fever chills nausea vomit muscle aches pains calf pain back pain chest pain shortness of breath.  Past Medical History:  Diagnosis Date   Anxiety    no meds   Arthritis    back and fingers    Bruxism    Cancer    Cataract    baby cataracts per pt.   Colon polyps    DDD (degenerative disc disease), lumbar    DDD (degenerative disc disease), lumbosacral    Depression    no meds   Diverticulosis of colon (without mention of hemorrhage)    Fibroid uterus    Headache, paroxysmal hemicrania, episodic 03/24/2014   Hemorrhoids    History of D&C    x2   History of night sweats    History of rectal bleeding    08/2017   Hx of diverticulitis of colon 03/30/2014   Hyperglycemia    Irritable bowel syndrome    PAF (paroxysmal atrial fibrillation) 01/2018   pt declined OAC   Pulmonary nodules - reports followed by pulmonology, reports following up with them in June 2015 03/26/2013   Repeat CT chest 04/20/14 > 1. Scattered pulmonary nodules measure 4 mm or less in size, are unchanged from 03/28/2013 and are therefore considered benign     Rosacea    Tobacco abuse    Unspecified hemorrhoids without mention of complication    Past Surgical History:  Procedure Laterality Date   APPENDECTOMY  1957   bce     CATARACT EXTRACTION     Left June 2021-Right 05/2020   CATARACT EXTRACTION, BILATERAL     CHOLECYSTECTOMY  2009   DILATION AND CURETTAGE OF UTERUS     fibroid removed from uterus     HYSTEROSCOPY WITH D & C N/A 09/09/2015   Procedure:  DILATATION AND CURETTAGE /HYSTEROSCOPY;  Surgeon: Waynard Reeds, MD;  Location: [facility];  Service: Gynecology;  Laterality: N/A;    Current Outpatient Medications:    AMBULATORY NON FORMULARY MEDICATION, Take 2 capsules by mouth daily. Medication Name: Osteo Matrix-Shaklee, Disp: , Rfl:    Ascorbic Acid (VITAMIN C PO), Take 1,500 mg by mouth 2 (two) times daily with a meal., Disp: , Rfl:    aspirin EC 81 MG tablet, Take 81 mg by mouth See admin instructions. Take one tablet (81 mg) by mouth every morning, may also take one tablet (81 mg) at night as needed for pain/sleep, Disp: , Rfl:    Cyanocobalamin (VITAMIN B-12 PO), Take 130 mg by mouth daily with supper., Disp: , Rfl:    dicyclomine (BENTYL) 10 MG capsule, Take 1 capsule (10 mg total) by mouth in the morning and at bedtime. TAKE 1 CAPSULE BY MOUTH FOUR TIMES DAILY BEFORE MEALS AND AT BEDTIME, Disp: 60 capsule, Rfl: 11   diltiazem (CARDIZEM CD) 120 MG 24 hr capsule, Take 1 capsule (120 mg total) by mouth daily., Disp: 90 capsule, Rfl: 3   hydrocodone-ibuprofen (VICOPROFEN) 5-200 MG tablet, Take 1 tablet by mouth  daily as needed for pain. (Patient taking differently: Take 1 tablet by mouth at bedtime as needed for pain (for back pain, which helps with insomnia).), Disp: 30 tablet, Rfl: 0   meloxicam (MOBIC) 7.5 MG tablet, Take 1 tablet (7.5 mg total) by mouth daily., Disp: 30 tablet, Rfl: 0   metroNIDAZOLE (METROGEL) 0.75 % gel, Apply 1 application topically every morning. Applies to face for rosacea, Disp: , Rfl:    triamcinolone (NASACORT) 55 MCG/ACT AERO nasal inhaler, Place 2 sprays into the nose daily as needed (congestion/sinus headache)., Disp: , Rfl:    VITAMIN D PO, Take 1,000 Units by mouth daily with breakfast., Disp: , Rfl:    Wheat Dextrin (BENEFIBER DRINK MIX PO), Take 15 mLs by mouth 2 (two) times daily. Takes once every other day, Disp: , Rfl:   Allergies  Allergen Reactions   Biaxin [Clarithromycin] Other (See Comments)     BREASTS INFLAMED   Codeine Other (See Comments)    PANIC ATTACKS, CAN'T SLEEP   Decongestant [Pseudoephedrine Hcl Er] Other (See Comments)    Cannot take with Dilitiazem   Phenylephrine Other (See Comments)    Cannot take with Dilitiazem   Rosuvastatin Nausea And Vomiting and Other (See Comments)   Acetaminophen Other (See Comments)    Triggered her "heart problem". Referring to atrial fibrillation   Review of Systems Objective:  There were no vitals filed for this visit.  General: Well developed, nourished, in no acute distress, alert and oriented x3   Dermatological: Skin is warm, dry and supple bilateral. Nails x 10 are thick yellow dystrophic clinically mycotic painful on palpation and debridement; remaining integument appears unremarkable at this time. There are no open sores, no preulcerative lesions, no rash or signs of infection present.  Vascular: Dorsalis Pedis artery and Posterior Tibial artery pedal pulses are 2/4 bilateral with immedate capillary fill time. Pedal hair growth present. No varicosities and no lower extremity edema present bilateral.   Neruologic: Grossly intact via light touch bilateral. Vibratory intact via tuning fork bilateral. Protective threshold with Semmes Wienstein monofilament intact to all pedal sites bilateral. Patellar and Achilles deep tendon reflexes 2+ bilateral. No Babinski or clonus noted bilateral.   Musculoskeletal: No gross boney pedal deformities bilateral. No pain, crepitus, or limitation noted with foot and ankle range of motion bilateral. Muscular strength 5/5 in all groups tested bilateral.  Gait: Unassisted, Nonantalgic.    Radiographs:  None taken  Assessment & Plan:   Assessment: Nail dystrophy.  Plan: Debridement of toenails 1 through 5 bilateral.     Jatavis Malek T. [place], [place]

## 2023-02-16 ENCOUNTER — Other Ambulatory Visit: Payer: Self-pay

## 2023-02-16 ENCOUNTER — Encounter (HOSPITAL_BASED_OUTPATIENT_CLINIC_OR_DEPARTMENT_OTHER): Payer: Self-pay | Admitting: Obstetrics and Gynecology

## 2023-02-16 NOTE — Progress Notes (Signed)
Spoke w/ via phone for pre-op interview---pt Lab needs dos----   I stat and ekg            Lab results------see below COVID test -----patient states asymptomatic no test needed Arrive at -------1000 am NPO after MN NO Solid Food.  Clear liquids from MN until---900 am Med rec completed Medications to take morning of surgery -----dicyclomine Diabetic medication -----none taken Patient instructed no nail polish to be worn day of surgery Patient instructed to bring photo id and insurance card day of surgery Patient aware to have Driver (ride ) / caregiver   Angel Holland son  for 24 hours after surgery  Patient Special Instructions -----none Pre-Op special Instructions -----none Patient verbalized understanding of instructions that were given at this phone interview. Patient denies shortness of breath, chest pain, fever, cough at this phone interview.  Anesthesia Review:hx of paf, dm type 2  PCP: lauren mcelwee np Cardiologist :lov dr berry 05-27-2019 epic, now followed by pcp for paf Chest x-ray :none EKG :none Echo :02-15-2018 Event monitor 2019 epic Stress test:none Cardiac Cath : none Lov dr wert pulmonary 04-17-2014 epic saw and released for benign left pulmonary nodule Activity level: good activity level with no sob Sleep Study/ CPAP :none ASA / Instructions/ Last Dose : last dose to be day before surgery 03-04-2023  Medical clearance note lauren mcelwee np  dated 12-11-2022 received and on chart fpr 03-05-2023 surgery

## 2023-02-22 DIAGNOSIS — Z0182 Encounter for allergy testing: Secondary | ICD-10-CM | POA: Diagnosis not present

## 2023-02-25 ENCOUNTER — Other Ambulatory Visit: Payer: Self-pay | Admitting: Nurse Practitioner

## 2023-02-26 NOTE — Telephone Encounter (Signed)
Refill request for  Meloicam 7.5 mg LR  01/29/23, #30, 0 rf LOV 01/29/23 FOV  04/30/23  Please review and advise.  Thanks. Dm/cma

## 2023-03-03 NOTE — Anesthesia Preprocedure Evaluation (Signed)
Anesthesia Evaluation  Patient identified by MRN, date of birth, ID band Patient awake    Reviewed: Allergy & Precautions, H&P , NPO status , Patient's Chart, lab work & pertinent test results  Airway Mallampati: II  TM Distance: >3 FB Neck ROM: Full    Dental no notable dental hx. (+) Teeth Intact, Dental Advisory Given   Pulmonary former smoker   Pulmonary exam normal breath sounds clear to auscultation       Cardiovascular hypertension, Normal cardiovascular exam+ dysrhythmias Atrial Fibrillation  Rhythm:Regular Rate:Normal  2019 TTE EF55-60%   Neuro/Psych negative neurological ROS  negative psych ROS   GI/Hepatic negative GI ROS, Neg liver ROS,,,  Endo/Other  diabetes    Renal/GU negative Renal ROS  negative genitourinary   Musculoskeletal negative musculoskeletal ROS (+) Arthritis ,    Abdominal   Peds negative pediatric ROS (+)  Hematology negative hematology ROS (+)   Anesthesia Other Findings All: See list  Reproductive/Obstetrics negative OB ROS                              Anesthesia Physical Anesthesia Plan  ASA: 3  Anesthesia Plan: General   Post-op Pain Management: Precedex and Ofirmev IV (intra-op)*   Induction: Intravenous  PONV Risk Score and Plan: Treatment may vary due to age or medical condition, Ondansetron, TIVA and Propofol infusion  Airway Management Planned: LMA  Additional Equipment: None  Intra-op Plan:   Post-operative Plan:   Informed Consent: I have reviewed the patients History and Physical, chart, labs and discussed the procedure including the risks, benefits and alternatives for the proposed anesthesia with the patient or authorized representative who has indicated his/her understanding and acceptance.     Dental advisory given  Plan Discussed with:   Anesthesia Plan Comments: (TIVA LMA)         Anesthesia Quick Evaluation

## 2023-03-04 NOTE — H&P (Signed)
Angel Holland is an 82 y.o. female G2P2 with fluid in endometrial lining.  Finding noted on CT scan after abdominal/pelvic pain.  Pt unable to tolerate EMB or TVUS.  Will perform EUA with hysteroscopy D&C.  Prior to surgery to insert cytotec in vagina.  D/W pt r/b/a of hysteroscopy/D&C - will proceed  Pertinent Gynecological History: G2P2 SVD x 2, Menopause mid 40's No abn pap, last many years ago No STDs  Menstrual History:  No LMP recorded. Patient is postmenopausal.    Past Medical History:  Diagnosis Date   Anxiety    no meds   Arthritis    back and fingers    basal cell carcinoma    Bruxism    Colon polyps    DDD (degenerative disc disease), lumbar    DDD (degenerative disc disease), lumbosacral    Depression    no meds   Diabetes mellitus without complication Type 2    Diverticulosis of colon (without mention of hemorrhage)    DJD (degenerative joint disease)    lower back   Dysphagia    trouble with very  large pills @ times   Dysrhythmia    Fibroid uterus    Hemorrhoids    History of COVID-19 09/2022   History of migraine    age 28's none since   Hx of diverticulitis of colon 09/2022   resolved   Hypertension    Irritable bowel syndrome with diarrhea and fecal incontinence    PAF (paroxysmal atrial fibrillation) (HCC) 01/2018   Pulmonary nodules - reports followed by pulmonology, reports following up with them in June 2015 03/26/2013   Repeat CT chest 04/20/14 > 1. Scattered pulmonary nodules measure 4 mm or less in size, are unchanged from 03/28/2013 and are therefore considered benign     Rosacea    Wears glasses     Past Surgical History:  Procedure Laterality Date   APPENDECTOMY  1957   CATARACT EXTRACTION     Left June 2021-Right 05/2020   CHOLECYSTECTOMY  2009   colonscopy  11/2017   DILATION AND CURETTAGE OF UTERUS     fibroid removed from uterus  1967   HYSTEROSCOPY WITH D & C N/A 09/09/2015   Procedure: DILATATION AND CURETTAGE /HYSTEROSCOPY;   Surgeon: Waynard Reeds, MD;  Location: [facility];  Service: Gynecology;  Laterality: N/A;    Family History  Problem Relation Age of Onset   Heart disease Mother        CHF   Diverticulosis Mother    Bone cancer Father        found in his mouth first   Colon cancer Neg Hx    Stomach cancer Neg Hx    Esophageal cancer Neg Hx    Pancreatic cancer Neg Hx    Colon polyps Neg Hx     Social History:  reports that she quit smoking about 14 years ago. Her smoking use included cigarettes. She has a 50.00 pack-year smoking history. She has never used smokeless tobacco. She reports that she does not drink alcohol and does not use drugs. Widowed, retired  Allergies:  Allergies  Allergen Reactions   Biaxin [Clarithromycin] Other (See Comments)    BREASTS INFLAMED   Codeine Other (See Comments)    PANIC ATTACKS, CAN'T SLEEP   Decongestant [Pseudoephedrine Hcl Er] Other (See Comments)    Cannot take with Dilitiazem   Phenylephrine Other (See Comments)    Cannot take with Dilitiazem   Rosuvastatin Nausea And Vomiting and  Other (See Comments)   Acetaminophen Other (See Comments)    Triggered her "heart problem". Referring to atrial fibrillation    Meds dicyclomine, diltiazm, hydrocodone/ibuptofen, meloxicam, metronidazole topical, biaxin (given cyto tec - instructed to break in half and place in vagia  Review of Systems  Constitutional: Negative.   Respiratory: Negative.    Cardiovascular: Negative.   Gastrointestinal: Negative.   Genitourinary: Negative.   Musculoskeletal: Negative.   Skin: Negative.   Neurological: Negative.   Hematological: Negative.   Psychiatric/Behavioral: Negative.      Height 4\' 10"  (1.473 m), weight 54.4 kg. Physical Exam Constitutional:      Appearance: Normal appearance.  HENT:     Head: Normocephalic and atraumatic.  Cardiovascular:     Rate and Rhythm: Normal rate and regular rhythm.  Pulmonary:     Effort: Pulmonary effort is normal.     Breath  sounds: Normal breath sounds.  Abdominal:     General: Bowel sounds are normal.     Palpations: Abdomen is soft.  Genitourinary:    General: Normal vulva.  Musculoskeletal:        General: Normal range of motion.     Cervical back: Normal range of motion and neck supple.  Skin:    General: Skin is warm and dry.  Neurological:     General: No focal deficit present.     Mental Status: She is alert and oriented to person, place, and time.  Psychiatric:        Mood and Affect: Mood normal.        Behavior: Behavior normal.     TVUS unable to tolerate, CT scan fluid in endometrial stripe'  Assessment/Plan: 81yo G2P2 for EUA/hysteroscopy/D&C to evaluate fluid in her uterus D/w pt r/b/a will proceed  Lyna Laningham Bovard-Stuckert 03/04/2023, 6:00 PM

## 2023-03-05 ENCOUNTER — Encounter (HOSPITAL_BASED_OUTPATIENT_CLINIC_OR_DEPARTMENT_OTHER)
Admission: RE | Disposition: A | Discharge status: Home / Self Care | Payer: Self-pay | Source: Home / Self Care | Attending: Obstetrics and Gynecology

## 2023-03-05 ENCOUNTER — Ambulatory Visit (HOSPITAL_BASED_OUTPATIENT_CLINIC_OR_DEPARTMENT_OTHER)
Admission: RE | Admit: 2023-03-05 | Discharge: 2023-03-05 | Disposition: A | Discharge status: Home / Self Care | Payer: Medicare Other | Attending: Obstetrics and Gynecology | Admitting: Obstetrics and Gynecology

## 2023-03-05 ENCOUNTER — Ambulatory Visit (HOSPITAL_BASED_OUTPATIENT_CLINIC_OR_DEPARTMENT_OTHER): Payer: Medicare Other | Admitting: Anesthesiology

## 2023-03-05 ENCOUNTER — Other Ambulatory Visit: Payer: Self-pay

## 2023-03-05 ENCOUNTER — Encounter (HOSPITAL_BASED_OUTPATIENT_CLINIC_OR_DEPARTMENT_OTHER): Payer: Self-pay | Admitting: Obstetrics and Gynecology

## 2023-03-05 DIAGNOSIS — Z01818 Encounter for other preprocedural examination: Secondary | ICD-10-CM

## 2023-03-05 DIAGNOSIS — R9389 Abnormal findings on diagnostic imaging of other specified body structures: Secondary | ICD-10-CM | POA: Insufficient documentation

## 2023-03-05 DIAGNOSIS — E119 Type 2 diabetes mellitus without complications: Secondary | ICD-10-CM | POA: Insufficient documentation

## 2023-03-05 DIAGNOSIS — I4891 Unspecified atrial fibrillation: Secondary | ICD-10-CM | POA: Diagnosis not present

## 2023-03-05 DIAGNOSIS — Z87891 Personal history of nicotine dependence: Secondary | ICD-10-CM | POA: Insufficient documentation

## 2023-03-05 DIAGNOSIS — I444 Left anterior fascicular block: Secondary | ICD-10-CM | POA: Diagnosis not present

## 2023-03-05 DIAGNOSIS — N858 Other specified noninflammatory disorders of uterus: Secondary | ICD-10-CM

## 2023-03-05 DIAGNOSIS — N859 Noninflammatory disorder of uterus, unspecified: Secondary | ICD-10-CM | POA: Diagnosis not present

## 2023-03-05 DIAGNOSIS — M1990 Unspecified osteoarthritis, unspecified site: Secondary | ICD-10-CM | POA: Diagnosis not present

## 2023-03-05 DIAGNOSIS — I10 Essential (primary) hypertension: Secondary | ICD-10-CM

## 2023-03-05 DIAGNOSIS — I5031 Acute diastolic (congestive) heart failure: Secondary | ICD-10-CM

## 2023-03-05 HISTORY — PX: HYSTEROSCOPY WITH D & C: SHX1775

## 2023-03-05 HISTORY — DX: Cardiac arrhythmia, unspecified: I49.9

## 2023-03-05 HISTORY — DX: Presence of spectacles and contact lenses: Z97.3

## 2023-03-05 HISTORY — DX: Dysphagia, unspecified: R13.10

## 2023-03-05 HISTORY — DX: Unspecified osteoarthritis, unspecified site: M19.90

## 2023-03-05 HISTORY — DX: Essential (primary) hypertension: I10

## 2023-03-05 HISTORY — DX: Type 2 diabetes mellitus without complications: E11.9

## 2023-03-05 HISTORY — DX: Personal history of other diseases of the nervous system and sense organs: Z86.69

## 2023-03-05 LAB — POCT I-STAT, CHEM 8
BUN: 17 mg/dL (ref 8–23)
BUN: 23 mg/dL (ref 8–23)
Calcium, Ion: 1 mmol/L — ABNORMAL LOW (ref 1.15–1.40)
Calcium, Ion: 1.22 mmol/L (ref 1.15–1.40)
Chloride: 104 mmol/L (ref 98–111)
Chloride: 107 mmol/L (ref 98–111)
Creatinine, Ser: 0.6 mg/dL (ref 0.44–1.00)
Creatinine, Ser: 0.6 mg/dL (ref 0.44–1.00)
Glucose, Bld: 127 mg/dL — ABNORMAL HIGH (ref 70–99)
Glucose, Bld: 128 mg/dL — ABNORMAL HIGH (ref 70–99)
HCT: 41 % (ref 36.0–46.0)
HCT: 43 % (ref 36.0–46.0)
Hemoglobin: 13.9 g/dL (ref 12.0–15.0)
Hemoglobin: 14.6 g/dL (ref 12.0–15.0)
Potassium: 4.4 mmol/L (ref 3.5–5.1)
Potassium: 6.1 mmol/L — ABNORMAL HIGH (ref 3.5–5.1)
Sodium: 136 mmol/L (ref 135–145)
Sodium: 139 mmol/L (ref 135–145)
TCO2: 23 mmol/L (ref 22–32)
TCO2: 23 mmol/L (ref 22–32)

## 2023-03-05 LAB — CBC
HCT: 42.4 % (ref 36.0–46.0)
Hemoglobin: 13.8 g/dL (ref 12.0–15.0)
MCH: 29.2 pg (ref 26.0–34.0)
MCHC: 32.5 g/dL (ref 30.0–36.0)
MCV: 89.8 fL (ref 80.0–100.0)
Platelets: 314 10*3/uL (ref 150–400)
RBC: 4.72 MIL/uL (ref 3.87–5.11)
RDW: 14.1 % (ref 11.5–15.5)
WBC: 7.7 10*3/uL (ref 4.0–10.5)
nRBC: 0 % (ref 0.0–0.2)

## 2023-03-05 LAB — GLUCOSE, CAPILLARY: Glucose-Capillary: 96 mg/dL (ref 70–99)

## 2023-03-05 SURGERY — DILATATION AND CURETTAGE /HYSTEROSCOPY
Anesthesia: General | Site: Vagina

## 2023-03-05 MED ORDER — LIDOCAINE HCL 1 % IJ SOLN
INTRAMUSCULAR | Status: DC | PRN
  Administered 2023-03-05: 10 mL

## 2023-03-05 MED ORDER — SODIUM CHLORIDE 0.9 % IR SOLN
Status: DC | PRN
  Administered 2023-03-05: 3000 mL

## 2023-03-05 MED ORDER — DEXMEDETOMIDINE HCL IN NACL 80 MCG/20ML IV SOLN
INTRAVENOUS | Status: DC | PRN
  Administered 2023-03-05: 8 ug via INTRAVENOUS

## 2023-03-05 MED ORDER — LACTATED RINGERS IV SOLN: INTRAVENOUS | Status: DC

## 2023-03-05 MED ORDER — ONDANSETRON HCL 4 MG/2ML IJ SOLN
INTRAMUSCULAR | Status: DC | PRN
  Administered 2023-03-05: 4 mg via INTRAVENOUS

## 2023-03-05 MED ORDER — POVIDONE-IODINE 10 % EX SWAB: 2.0000 | Freq: Once | CUTANEOUS | Status: DC

## 2023-03-05 MED ORDER — PROPOFOL 500 MG/50ML IV EMUL
INTRAVENOUS | Status: AC
  Filled 2023-03-05: qty 50

## 2023-03-05 MED ORDER — PROPOFOL 10 MG/ML IV BOLUS
INTRAVENOUS | Status: DC | PRN
  Administered 2023-03-05 (×2): 20 mg via INTRAVENOUS
  Administered 2023-03-05: 90 mg via INTRAVENOUS

## 2023-03-05 MED ORDER — FENTANYL CITRATE (PF) 250 MCG/5ML IJ SOLN
INTRAMUSCULAR | Status: DC | PRN
  Administered 2023-03-05: 50 ug via INTRAVENOUS
  Administered 2023-03-05 (×2): 25 ug via INTRAVENOUS

## 2023-03-05 MED ORDER — PROPOFOL 500 MG/50ML IV EMUL
INTRAVENOUS | Status: DC | PRN
  Administered 2023-03-05: 150 ug/kg/min via INTRAVENOUS

## 2023-03-05 MED ORDER — LIDOCAINE 2% (20 MG/ML) 5 ML SYRINGE
INTRAMUSCULAR | Status: DC | PRN
  Administered 2023-03-05: 80 mg via INTRAVENOUS

## 2023-03-05 MED ORDER — FENTANYL CITRATE (PF) 100 MCG/2ML IJ SOLN
INTRAMUSCULAR | Status: AC
  Filled 2023-03-05: qty 2

## 2023-03-05 SURGICAL SUPPLY — 15 items
CATH ROBINSON RED A/P 16FR (CATHETERS) ×1 IMPLANT
DILATOR CANAL MILEX (MISCELLANEOUS) IMPLANT
GLOVE BIO SURGEON STRL SZ 6.5 (GLOVE) ×1 IMPLANT
GLOVE BIOGEL PI IND STRL 6 (GLOVE) IMPLANT
GLOVE BIOGEL PI IND STRL 6.5 (GLOVE) IMPLANT
GOWN STRL REUS W/TWL LRG LVL3 (GOWN DISPOSABLE) ×1 IMPLANT
IV NS IRRIG 3000ML ARTHROMATIC (IV SOLUTION) IMPLANT
KIT PROCEDURE FLUENT (KITS) ×1 IMPLANT
KIT TURNOVER CYSTO (KITS) ×1 IMPLANT
PACK VAGINAL MINOR WOMEN LF (CUSTOM PROCEDURE TRAY) ×1 IMPLANT
PAD OB MATERNITY 4.3X12.25 (PERSONAL CARE ITEMS) ×1 IMPLANT
PAD PREP 24X48 CUFFED NSTRL (MISCELLANEOUS) ×1 IMPLANT
SEAL ROD LENS SCOPE MYOSURE (ABLATOR) ×1 IMPLANT
SLEEVE SCD COMPRESS KNEE MED (STOCKING) ×1 IMPLANT
TOWEL OR 17X24 6PK STRL BLUE (TOWEL DISPOSABLE) ×2 IMPLANT

## 2023-03-05 NOTE — Interval H&P Note (Signed)
History and Physical Interval Note:  03/05/2023 10:49 AM  Angel Holland  has presented today for surgery, with the diagnosis of endometrial disorder.  The various methods of treatment have been discussed with the patient and family. After consideration of risks, benefits and other options for treatment, the patient has consented to  Procedure(s): DILATATION AND CURETTAGE /HYSTEROSCOPY (N/A) as a surgical intervention.  The patient's history has been reviewed, patient examined, no change in status, stable for surgery.  I have reviewed the patient's chart and labs.  Questions were answered to the patient's satisfaction.     [name]

## 2023-03-05 NOTE — Discharge Instructions (Signed)

## 2023-03-05 NOTE — Transfer of Care (Signed)
Immediate Anesthesia Transfer of Care Note  Patient: Angel Holland  Procedure(s) Performed: DILATATION AND CURETTAGE /HYSTEROSCOPY (Vagina )  Patient Location: PACU  Anesthesia Type:General  Level of Consciousness: sedated  Airway & Oxygen Therapy: Patient Spontanous Breathing and Patient connected to face mask oxygen  Post-op Assessment: Report given to RN and Post -op Vital signs reviewed and stable  Post vital signs: Reviewed and stable  Last Vitals:  Vitals Value Taken Time  BP 104/54 03/05/23 1253  Temp    Pulse 66 03/05/23 1256  Resp 8 03/05/23 1256  SpO2 98 % 03/05/23 1256  Vitals shown include unvalidated device data.  Last Pain:  Vitals:   03/05/23 1025  TempSrc: Oral  PainSc: 2       Patients Stated Pain Goal: 4 (03/05/23 1025)  Complications: No notable events documented.

## 2023-03-05 NOTE — Op Note (Signed)
NAME: Angel Holland, Angel Holland MEDICAL RECORD NO: [id number] ACCOUNT NO: [id number] DATE OF BIRTH: February 21, 1941 FACILITY: [facility] LOCATION: [facility] PHYSICIAN: Sherian Rein, MD  Operative Report   DATE OF PROCEDURE: 03/05/2023  PREOPERATIVE DIAGNOSIS:  Endometrial fluid noted on a CT scan.  POSTOPERATIVE DIAGNOSIS:  Endometrial fluid noted on a CT scan.  PROCEDURE:  Hysteroscopy, D and C.  SURGEON:  Sherian Rein, MD  ANESTHESIA:  Local and general by LMA.  ESTIMATED BLOOD LOSS:  5 mL.  IV FLUID AND URINE OUTPUT:  Per anesthesia.  COMPLICATIONS:  None.  PATHOLOGY: Endometrial curettings and endometrial lesion biopsy.  DESCRIPTION OF PROCEDURE:  After informed consent was reviewed with the patient including risks, benefits, alternatives, and surgical procedure she was transported to the operating room, placed on the table in supine position.  General anesthesia was  induced and found to be adequate.  She was then placed in the [place] stirrups, prepped and draped in normal sterile fashion.  After an appropriate timeout was performed a Pederson open-sided speculum was placed in her vagina and her cervix was easily  visualized, grasped with a single tooth tenaculum and a 10 mL paracervical block was placed.  Her cervix was attempted to be dilated and had to use a disposable dilator throughout the process.  It was dilated to 79- [place] to accommodate the  hysteroscope.  Hysteroscope was introduced.  Bilateral ostia were noted mostly atrophic lining with a small lesion on the patient's left side posteriorly.  This lesion was biopsied through the hysteroscope and this biopsy was sent separately.  The  instruments were removed from the vagina.  Her cervix was noted to be hemostatic.  She was awakened in stable condition.  Sponge, lap and needle counts were correct x2 per the operating staff.  She was transferred to the PACU.   PUS D: 03/05/2023 12:59:42 pm T: 03/05/2023 3:14:00 pm   JOB: [id number]/ [id number]

## 2023-03-05 NOTE — Brief Op Note (Signed)
03/05/2023  12:54 PM  PATIENT:  Angel Holland  82 y.o. female  PRE-OPERATIVE DIAGNOSIS:  endometrial disorder  POST-OPERATIVE DIAGNOSIS:  endometrial disorder  PROCEDURE:  Procedure(s): DILATATION AND CURETTAGE /HYSTEROSCOPY (N/A)  SURGEON:  Surgeon(s) and Role:    * Bovard-Stuckert, Majd Tissue, MD - Primary  ANESTHESIA:   local and general by LMA  EBL:  5 mL IVF and uop by anesthesia  BLOOD ADMINISTERED:none  DRAINS: none   LOCAL MEDICATIONS USED:  LIDOCAINE   SPECIMEN:  Source of Specimen:  endometrial curretting, endometrial lesion biopsy  DISPOSITION OF SPECIMEN:  PATHOLOGY  COUNTS:  YES  TOURNIQUET:  * No tourniquets in log *  DICTATION: .Other Dictation: Dictation Number [id number]  PLAN OF CARE: Discharge to home after PACU  PATIENT DISPOSITION:  PACU - hemodynamically stable.   Delay start of Pharmacological VTE agent (>24hrs) due to surgical blood loss or risk of bleeding: not applicable

## 2023-03-05 NOTE — Discharge Instr - Supplementary Instructions (Signed)
Take short walks around the house every 1-2 hours while awake.

## 2023-03-05 NOTE — Anesthesia Postprocedure Evaluation (Signed)
Anesthesia Post Note  Patient: Angel Holland  Procedure(s) Performed: DILATATION AND CURETTAGE /HYSTEROSCOPY (Vagina )     Patient location during evaluation: PACU Anesthesia Type: General Level of consciousness: awake and alert Pain management: pain level controlled Vital Signs Assessment: post-procedure vital signs reviewed and stable Respiratory status: spontaneous breathing, nonlabored ventilation, respiratory function stable and patient connected to nasal cannula oxygen Cardiovascular status: blood pressure returned to baseline and stable Postop Assessment: no apparent nausea or vomiting Anesthetic complications: no  No notable events documented.  Last Vitals:  Vitals:   03/05/23 1345 03/05/23 1356  BP:    Pulse:  79  Resp:  18  Temp: 36.6 C   SpO2:  98%    Last Pain:  Vitals:   03/05/23 1356  TempSrc:   PainSc: 0-No pain                 Trevor Iha

## 2023-03-05 NOTE — Anesthesia Procedure Notes (Signed)
Procedure Name: LMA Insertion Date/Time: 03/05/2023 12:12 PM  Performed by: Dairl Ponder, CRNAPre-anesthesia Checklist: Patient identified, Emergency Drugs available, Suction available and Patient being monitored Patient Re-evaluated:Patient Re-evaluated prior to induction Oxygen Delivery Method: Circle System Utilized Preoxygenation: Pre-oxygenation with 100% oxygen Induction Type: IV induction Ventilation: Mask ventilation without difficulty LMA: LMA inserted LMA Size: 3.0 Number of attempts: 1 Airway Equipment and Method: Bite block Placement Confirmation: positive ETCO2 Tube secured with: Tape Dental Injury: Teeth and Oropharynx as per pre-operative assessment

## 2023-03-06 LAB — SURGICAL PATHOLOGY

## 2023-03-07 ENCOUNTER — Encounter (HOSPITAL_BASED_OUTPATIENT_CLINIC_OR_DEPARTMENT_OTHER): Payer: Self-pay | Admitting: Obstetrics and Gynecology

## 2023-03-19 DIAGNOSIS — L570 Actinic keratosis: Secondary | ICD-10-CM | POA: Diagnosis not present

## 2023-03-19 DIAGNOSIS — Z85828 Personal history of other malignant neoplasm of skin: Secondary | ICD-10-CM | POA: Diagnosis not present

## 2023-03-19 DIAGNOSIS — L820 Inflamed seborrheic keratosis: Secondary | ICD-10-CM | POA: Diagnosis not present

## 2023-04-30 ENCOUNTER — Ambulatory Visit (INDEPENDENT_AMBULATORY_CARE_PROVIDER_SITE_OTHER): Payer: Medicare Other | Admitting: Nurse Practitioner

## 2023-04-30 ENCOUNTER — Encounter: Payer: Self-pay | Admitting: Nurse Practitioner

## 2023-04-30 VITALS — BP 104/78 | HR 71 | Temp 97.1°F | Resp 16 | Ht <= 58 in | Wt 124.0 lb

## 2023-04-30 DIAGNOSIS — M5416 Radiculopathy, lumbar region: Secondary | ICD-10-CM

## 2023-04-30 DIAGNOSIS — F5101 Primary insomnia: Secondary | ICD-10-CM

## 2023-04-30 DIAGNOSIS — E119 Type 2 diabetes mellitus without complications: Secondary | ICD-10-CM

## 2023-04-30 MED ORDER — DOXEPIN HCL 3 MG PO TABS: 3.0000 mg | ORAL_TABLET | Freq: Every evening | ORAL | 2 refills | Status: DC | PRN

## 2023-04-30 NOTE — Assessment & Plan Note (Signed)
Chronic, stable. Will request recent eye exam results. Check labs next visit. Currently controlled with diet. Follow-up in 3 months.

## 2023-04-30 NOTE — Assessment & Plan Note (Signed)
Chronic, stable. The hydrocodone-ibuprofen was causing her to have blisters in her mouth and lips peeling. Stop this and follow-up with Dr. Ethelene Hal. She can take ibuprofen as needed for pain. Continue washing out her mouth with warm salt water as needed. No blisters seen on exam today.

## 2023-04-30 NOTE — Progress Notes (Signed)
Established Patient Office Visit  Subjective   Patient ID: Angel Holland, female    DOB: 09/20/1941  Age: 82 y.o. MRN: [id number]  Chief Complaint  Patient presents with   Follow-up    3 mos f/u     HPI  ALLYSSIA SISTO is here to follow-up on chronic back pain and diabetes.   She states that she started having an allergic reaction to her hydrocodone-ibuprofen. She was taking this at bedtime for her chronic back pain. She stopped taking this for a few days now, but it caused her lips to swell and peel along with some blisters in her mouth. She has rinsed her mouth out with salt water and was taking an over the counter allergy medication which helped. She is going back to see her orthopedist next month.   She also notes that the hydrocodone was helping her sleep. Since she stopped taking it, she has been having trouble sleeping for more than an hour at a time. She has not tried anything over the counter as she didn't want it to interfere with her heart medication. She is also sleeping during the day because she is not sleeping at night.   She is not checking her blood sugars at home. She denies chest pain and shortness of breath. She went to the eye doctor last week.     ROS See pertinent positives and negatives per HPI.    Objective:     BP 104/78 (BP Location: Left Arm, Patient Position: Sitting, Cuff Size: Normal)   Pulse 71   Temp (!) 97.1 F (36.2 C)   Resp 16   Ht 4\' 10"  (1.473 m)   Wt 124 lb (56.2 kg)   SpO2 99%   BMI 25.92 kg/m    Physical Exam Vitals and nursing note reviewed.  Constitutional:      General: She is not in acute distress.    Appearance: Normal appearance.  HENT:     Head: Normocephalic.  Eyes:     Conjunctiva/sclera: Conjunctivae normal.  Cardiovascular:     Rate and Rhythm: Normal rate and regular rhythm.     Pulses: Normal pulses.     Heart sounds: Normal heart sounds.  Pulmonary:     Effort: Pulmonary effort is normal.     Breath  sounds: Normal breath sounds.  Musculoskeletal:     Cervical back: Normal range of motion.  Skin:    General: Skin is warm.  Neurological:     General: No focal deficit present.     Mental Status: She is alert and oriented to person, place, and time.  Psychiatric:        Mood and Affect: Mood normal.        Behavior: Behavior normal.        Thought Content: Thought content normal.        Judgment: Judgment normal.       Assessment & Plan:   Problem List Items Addressed This Visit       Endocrine   Diabetes mellitus type II, controlled (HCC) - Primary    Chronic, stable. Will request recent eye exam results. Check labs next visit. Currently controlled with diet. Follow-up in 3 months.         Nervous and Auditory   Lumbar radiculopathy    Chronic, stable. The hydrocodone-ibuprofen was causing her to have blisters in her mouth and lips peeling. Stop this and follow-up with Dr. Ethelene Hal. She can take ibuprofen as needed for  pain. Continue washing out her mouth with warm salt water as needed. No blisters seen on exam today.       Relevant Medications   Doxepin HCl 3 MG TABS     Other   Primary insomnia    She states the hydrocodone was helping her sleep and since she stopped taking it, she is only sleeping for an hour at a time. Will have her start doxepin 3mg  at bedtime as needed for sleep. Follow-up in 3 months or sooner with concerns.        Return in about 3 months (around 07/31/2023) for Diabetes.    Gerre Scull, NP

## 2023-04-30 NOTE — Assessment & Plan Note (Signed)
She states the hydrocodone was helping her sleep and since she stopped taking it, she is only sleeping for an hour at a time. Will have her start doxepin 3mg  at bedtime as needed for sleep. Follow-up in 3 months or sooner with concerns.

## 2023-04-30 NOTE — Patient Instructions (Signed)
It was great to see you!  Keep gargling with warm salt water as needed.  Start doxepin 1 capsule at bedtime as needed for sleep  Let's follow-up in 3 months, sooner if you have concerns.  If a referral was placed today, you will be contacted for an appointment. Please note that routine referrals can sometimes take up to 3-4 weeks to process. Please call our office if you haven't heard anything after this time frame.  Take care,  Rodman Pickle, NP

## 2023-06-04 DIAGNOSIS — M48062 Spinal stenosis, lumbar region with neurogenic claudication: Secondary | ICD-10-CM | POA: Diagnosis not present

## 2023-06-04 DIAGNOSIS — M5416 Radiculopathy, lumbar region: Secondary | ICD-10-CM | POA: Diagnosis not present

## 2023-06-04 DIAGNOSIS — T7840XD Allergy, unspecified, subsequent encounter: Secondary | ICD-10-CM | POA: Diagnosis not present

## 2023-06-15 ENCOUNTER — Other Ambulatory Visit: Payer: Self-pay

## 2023-06-15 ENCOUNTER — Encounter: Payer: Self-pay | Admitting: Allergy

## 2023-06-15 ENCOUNTER — Ambulatory Visit (INDEPENDENT_AMBULATORY_CARE_PROVIDER_SITE_OTHER): Payer: Medicare Other | Admitting: Allergy

## 2023-06-15 ENCOUNTER — Telehealth: Payer: Self-pay | Admitting: Allergy

## 2023-06-15 VITALS — BP 120/70 | HR 73 | Temp 98.5°F | Resp 18 | Ht 58.5 in | Wt 130.5 lb

## 2023-06-15 DIAGNOSIS — S00522D Blister (nonthermal) of oral cavity, subsequent encounter: Secondary | ICD-10-CM

## 2023-06-15 DIAGNOSIS — T783XXD Angioneurotic edema, subsequent encounter: Secondary | ICD-10-CM | POA: Diagnosis not present

## 2023-06-15 NOTE — Patient Instructions (Addendum)
Lip swelling Tongue swelling Blisters in mouth  - concerned the initial lip swelling is related to side effect of Hydrocodone (this is an opiate medication and opiates are known to cause swelling, itching and hives as a side effect of the medication).  If possible would avoid opiate medications - tongue swelling and oral lesions after use of allergy relief medication (likely Loratadine).  Would avoid Loratadine as antihistamine option.  - will obtain labwork to assess for swelling.  Will call you when all results return in 1-2 weeks.  - recommend use of plain Vaseline on the lips.   Some people are reactive to ingredients in Aquafor thus would change to Vaseline for now to see if this helps the lips heal - if needing antihistamine for swelling/hive/itch relief then would recommend Xyzal (Levocetirizine) 5mg  daily as needed.    Follow-up pending labs

## 2023-06-15 NOTE — Progress Notes (Signed)
New Patient Note  RE: Angel Holland MRN: [id number] DOB: 08/04/1941 Date of Office Visit: 06/15/2023   Primary care provider: Gerre Scull, NP  Chief Complaint: Swelling  History of present illness: Angel Holland is a 82 y.o. female presenting today for evaluation of allergy.  She states several years ago she started having back pain and states one of her doctors prescribed hydrocodone for her back pain.  She states she had another incident where she fell on concrete floor and for pain control hydrocodone was used for that pain control.  Thus she had had this pain medication before that would work for her. She states she had surgery in May with a dilation and curettage/hysteroscopy.  For pain control she was prescribed hydrocodone.  She states that she increased the dose of the hydrocodone she developed lip swelling.  She states she had an appt at her dentist and the hygienist recommended she take an allergy medication for the swelling. States she got a [facility] allergy relief brand (blue box that loratidine) and states that she had a reaction to that where it caused her tongue to swell and developed blisters in the mouth. She did warm salt water gargling for about a week and the blister.  She states she did not take any more hydrocodone after the initial lip swelling.    She states her lips are still a bit swollen.  She has to keep Aquaphor on her lips otherwise it feels like it is drying out.  She has not noted any foods that she eats in her diet that have triggered her worsening symptoms.  Review of systems: 10pt ROS negative unless noted above in HPI  Past medical history: Past Medical History:  Diagnosis Date   Angio-edema    Anxiety    no meds   Arthritis    back and fingers    basal cell carcinoma    Bruxism    Colon polyps    DDD (degenerative disc disease), lumbar    DDD (degenerative disc disease), lumbosacral    Depression    no meds   Diabetes mellitus without  complication Type 2    Diverticulosis of colon (without mention of hemorrhage)    DJD (degenerative joint disease)    lower back   Dysphagia    trouble with very  large pills @ times   Dysrhythmia    Fibroid uterus    Hemorrhoids    History of COVID-19 09/2022   History of migraine    age 88's none since   Hx of diverticulitis of colon 09/2022   resolved   Hypertension    Irritable bowel syndrome with diarrhea and fecal incontinence    PAF (paroxysmal atrial fibrillation) (HCC) 01/2018   Pulmonary nodules - reports followed by pulmonology, reports following up with them in June 2015 03/26/2013   Repeat CT chest 04/20/14 > 1. Scattered pulmonary nodules measure 4 mm or less in size, are unchanged from 03/28/2013 and are therefore considered benign     Rosacea    Wears glasses     Past surgical history: Past Surgical History:  Procedure Laterality Date   APPENDECTOMY  1957   CATARACT EXTRACTION     Left June 2021-Right 05/2020   CHOLECYSTECTOMY  2009   colonscopy  11/2017   DILATION AND CURETTAGE OF UTERUS     fibroid removed from uterus  1967   HYSTEROSCOPY WITH D & C N/A 09/09/2015   Procedure: DILATATION AND CURETTAGE /  HYSTEROSCOPY;  Surgeon: Waynard Reeds, MD;  Location: [facility];  Service: Gynecology;  Laterality: N/A;   HYSTEROSCOPY WITH D & C N/A 03/05/2023   Procedure: DILATATION AND CURETTAGE /HYSTEROSCOPY;  Surgeon: Sherian Rein, MD;  Location: [facility];  Service: Gynecology;  Laterality: N/A;   TONSILLECTOMY      Family history:  Family History  Problem Relation Age of Onset   Heart disease Mother        CHF   Diverticulosis Mother    Bone cancer Father        found in his mouth first   Colon cancer Neg Hx    Stomach cancer Neg Hx    Esophageal cancer Neg Hx    Pancreatic cancer Neg Hx    Colon polyps Neg Hx     Social history: Lives in a home with carpeting with gas heating and central cooling.  No pets in the home.  There is no  concern for water damage, mildew or roaches in the home. Occupational History   Occupation: retired    Comment: Audiological scientist  Tobacco Use   Smoking status: Former    Current packs/day: 0.00    Average packs/day: 1 pack/day for 50.0 years (50.0 ttl pk-yrs)    Types: Cigarettes    Start date: 07/23/1958    Quit date: 07/23/2008    Years since quitting: 14.9    Passive exposure: Never   Smokeless tobacco: Never  Vaping Use   Vaping status: Never Used     Medication List: Current Outpatient Medications  Medication Sig Dispense Refill   AMBULATORY NON FORMULARY MEDICATION Take 2 capsules by mouth daily. Medication Name: Osteo Matrix-Shaklee     Ascorbic Acid (VITAMIN C PO) Take 1,500 mg by mouth 2 (two) times daily with a meal.     aspirin EC 81 MG tablet Take 81 mg by mouth See admin instructions. Take one tablet (81 mg) by mouth every morning, may also take one tablet (81 mg) at night as needed for pain/sleep     Cyanocobalamin (VITAMIN B-12 PO) Take 130 mg by mouth daily with supper.     dicyclomine (BENTYL) 10 MG capsule Take 1 capsule (10 mg total) by mouth in the morning and at bedtime. TAKE 1 CAPSULE BY MOUTH FOUR TIMES DAILY BEFORE MEALS AND AT BEDTIME (Patient taking differently: Take 10 mg by mouth in the morning and at bedtime.) 60 capsule 11   diltiazem (CARDIZEM CD) 120 MG 24 hr capsule Take 1 capsule (120 mg total) by mouth daily. (Patient taking differently: Take 120 mg by mouth every evening. Q evening at 500 pm) 90 capsule 3   triamcinolone (NASACORT) 55 MCG/ACT AERO nasal inhaler Place 2 sprays into the nose daily as needed (congestion/sinus headache).     VITAMIN D PO Take 1,000 Units by mouth daily with breakfast.     Wheat Dextrin (BENEFIBER DRINK MIX PO) Take 15 mLs by mouth. Takes once every other day     Doxepin HCl 3 MG TABS Take 1 tablet (3 mg total) by mouth at bedtime as needed (sleep). (Patient not taking: Reported on 06/15/2023) 30 tablet 2   hydrocodone-ibuprofen  (VICOPROFEN) 5-200 MG tablet Take 1 tablet by mouth daily as needed for pain. (Patient not taking: Reported on 06/15/2023) 30 tablet 0   metroNIDAZOLE (METROGEL) 0.75 % gel Apply 1 application topically every morning. Applies to face for rosacea (Patient not taking: Reported on 06/15/2023)     No current facility-administered medications for  this visit.    Known medication allergies: Allergies  Allergen Reactions   Hydrocodone Swelling    Lip Swelling    Biaxin [Clarithromycin] Other (See Comments)    BREASTS INFLAMED   Codeine Other (See Comments)    PANIC ATTACKS, CAN'T SLEEP   Decongestant [Pseudoephedrine Hcl Er] Other (See Comments)    Cannot take with Dilitiazem   Mobic [Meloxicam] Swelling    Lip swelling   Phenylephrine Other (See Comments)    Cannot take with Dilitiazem   Rosuvastatin Nausea And Vomiting and Other (See Comments)   Acetaminophen Other (See Comments)    Triggered her "heart problem". Referring to atrial fibrillation     Physical examination: Blood pressure 120/70, pulse 73, temperature 98.5 F (36.9 C), temperature source Temporal, resp. rate 18, height 4' 10.5" (1.486 m), weight 130 lb 8 oz (59.2 kg), SpO2 97%.  General: Alert, interactive, in no acute distress. HEENT: PERRLA, TMs pearly gray, turbinates non-edematous without discharge, post-pharynx non erythematous. Neck: Supple without lymphadenopathy. Lungs: Clear to auscultation without wheezing, rhonchi or rales. {no increased work of breathing. CV: Normal S1, S2 without murmurs. Abdomen: Nondistended, nontender. Skin: Lower lip with erythema and mild edema . Extremities:  No clubbing, cyanosis or edema. Neuro:   Grossly intact.  Diagnositics/Labs: None today  Assessment and plan: Lip swelling Tongue swelling Blisters in mouth  - concerned the initial lip swelling is related to side effect of Hydrocodone (this is an opiate medication and opiates are known to cause swelling, itching and  hives as a side effect of the medication).  If possible would avoid opiate medications - tongue swelling and oral lesions after use of allergy relief medication (likely Loratadine).  Would avoid Loratadine as antihistamine option.  - will obtain labwork to assess for swelling.  Will call you when all results return in 1-2 weeks.  - recommend use of plain Vaseline on the lips.   Some people are reactive to ingredients in Aquafor thus would change to Vaseline for now to see if this helps the lips heal - if needing antihistamine for swelling/hive/itch relief then would recommend Xyzal (Levocetirizine) 5mg  daily as needed.    Follow-up pending labs  I appreciate the opportunity to take part in Destaney's care. Please do not hesitate to contact me with questions.  Sincerely,   Margo Aye, MD Allergy/Immunology [facility]

## 2023-06-20 DIAGNOSIS — H353132 Nonexudative age-related macular degeneration, bilateral, intermediate dry stage: Secondary | ICD-10-CM | POA: Diagnosis not present

## 2023-06-20 LAB — HM DIABETES EYE EXAM

## 2023-06-22 LAB — COMPLEMENT COMPONENT C1Q: Complement C1Q: 7 mg/dL — ABNORMAL LOW (ref 10.3–20.5)

## 2023-06-22 LAB — ALPHA-GAL PANEL
Allergen Lamb IgE: <0.1 kU/L
Beef IgE: <0.1 kU/L
IgE (Immunoglobulin E), Serum: 22 [IU]/mL (ref 6–495)
O215-IgE Alpha-Gal: <0.1 kU/L
Pork IgE: <0.1 kU/L

## 2023-06-22 LAB — ALLERGENS W/TOTAL IGE AREA 2

## 2023-06-22 LAB — ANA W/REFLEX: Anti Nuclear Antibody (ANA): NEGATIVE

## 2023-06-22 LAB — C1 ESTERASE INHIBITOR, FUNCTIONAL: C1INH Functional/C1INH Total MFr SerPl: >110 %{normal}

## 2023-06-22 LAB — SEDIMENTATION RATE: Sed Rate: 18 mm/h (ref 0–40)

## 2023-06-22 LAB — C-REACTIVE PROTEIN: CRP: 3 mg/L (ref 0–10)

## 2023-06-22 LAB — C4 COMPLEMENT: Complement C4, Serum: 20 mg/dL (ref 12–38)

## 2023-06-22 LAB — TRYPTASE: Tryptase: 5.6 ug/L (ref 2.2–13.2)

## 2023-06-22 LAB — C1 ESTERASE INHIBITOR: C1INH SerPl-mCnc: 38 mg/dL (ref 21–39)

## 2023-07-26 DIAGNOSIS — D1801 Hemangioma of skin and subcutaneous tissue: Secondary | ICD-10-CM | POA: Diagnosis not present

## 2023-07-26 DIAGNOSIS — L820 Inflamed seborrheic keratosis: Secondary | ICD-10-CM | POA: Diagnosis not present

## 2023-07-26 DIAGNOSIS — Z85828 Personal history of other malignant neoplasm of skin: Secondary | ICD-10-CM | POA: Diagnosis not present

## 2023-07-26 DIAGNOSIS — L570 Actinic keratosis: Secondary | ICD-10-CM | POA: Diagnosis not present

## 2023-07-26 DIAGNOSIS — L308 Other specified dermatitis: Secondary | ICD-10-CM | POA: Diagnosis not present

## 2023-07-26 DIAGNOSIS — L821 Other seborrheic keratosis: Secondary | ICD-10-CM | POA: Diagnosis not present

## 2023-07-31 ENCOUNTER — Ambulatory Visit (INDEPENDENT_AMBULATORY_CARE_PROVIDER_SITE_OTHER): Payer: Medicare Other | Admitting: Nurse Practitioner

## 2023-07-31 ENCOUNTER — Encounter: Payer: Self-pay | Admitting: Nurse Practitioner

## 2023-07-31 VITALS — BP 122/70 | HR 75 | Temp 97.6°F | Ht 58.5 in | Wt 130.6 lb

## 2023-07-31 DIAGNOSIS — Z23 Encounter for immunization: Secondary | ICD-10-CM | POA: Diagnosis not present

## 2023-07-31 DIAGNOSIS — M79645 Pain in left finger(s): Secondary | ICD-10-CM | POA: Diagnosis not present

## 2023-07-31 DIAGNOSIS — E785 Hyperlipidemia, unspecified: Secondary | ICD-10-CM

## 2023-07-31 DIAGNOSIS — E1169 Type 2 diabetes mellitus with other specified complication: Secondary | ICD-10-CM | POA: Diagnosis not present

## 2023-07-31 DIAGNOSIS — F5101 Primary insomnia: Secondary | ICD-10-CM

## 2023-07-31 DIAGNOSIS — E119 Type 2 diabetes mellitus without complications: Secondary | ICD-10-CM

## 2023-07-31 LAB — COMPREHENSIVE METABOLIC PANEL
ALT: 13 U/L (ref 0–35)
AST: 13 U/L (ref 0–37)
Albumin: 4.2 g/dL (ref 3.5–5.2)
Alkaline Phosphatase: 79 U/L (ref 39–117)
BUN: 16 mg/dL (ref 6–23)
CO2: 26 meq/L (ref 19–32)
Calcium: 9.6 mg/dL (ref 8.4–10.5)
Chloride: 103 meq/L (ref 96–112)
Creatinine, Ser: 0.74 mg/dL (ref 0.40–1.20)
GFR: 75.14 mL/min (ref >60.00)
Glucose, Bld: 101 mg/dL — ABNORMAL HIGH (ref 70–99)
Potassium: 4.3 meq/L (ref 3.5–5.1)
Sodium: 138 meq/L (ref 135–145)
Total Bilirubin: 0.3 mg/dL (ref 0.2–1.2)
Total Protein: 6.9 g/dL (ref 6.0–8.3)

## 2023-07-31 LAB — CBC WITH DIFFERENTIAL/PLATELET
Basophils Absolute: 0.1 10*3/uL (ref 0.0–0.1)
Basophils Relative: 0.8 % (ref 0.0–3.0)
Eosinophils Absolute: 0 10*3/uL (ref 0.0–0.7)
Eosinophils Relative: 0.5 % (ref 0.0–5.0)
HCT: 39.6 % (ref 36.0–46.0)
Hemoglobin: 12.7 g/dL (ref 12.0–15.0)
Lymphocytes Relative: 19.9 % (ref 12.0–46.0)
Lymphs Abs: 1.7 10*3/uL (ref 0.7–4.0)
MCHC: 32 g/dL (ref 30.0–36.0)
MCV: 88.5 fL (ref 78.0–100.0)
Monocytes Absolute: 0.7 10*3/uL (ref 0.1–1.0)
Monocytes Relative: 8.3 % (ref 3.0–12.0)
Neutro Abs: 6.1 10*3/uL (ref 1.4–7.7)
Neutrophils Relative %: 70.5 % (ref 43.0–77.0)
Platelets: 293 10*3/uL (ref 150.0–400.0)
RBC: 4.47 Mil/uL (ref 3.87–5.11)
RDW: 14.3 % (ref 11.5–15.5)
WBC: 8.7 10*3/uL (ref 4.0–10.5)

## 2023-07-31 LAB — LIPID PANEL
Cholesterol: 148 mg/dL (ref 0–200)
HDL: 43.8 mg/dL (ref >39.00)
LDL Cholesterol: 68 mg/dL (ref 0–99)
NonHDL: 104.23
Total CHOL/HDL Ratio: 3
Triglycerides: 181 mg/dL — ABNORMAL HIGH (ref 0.0–149.0)
VLDL: 36.2 mg/dL (ref 0.0–40.0)

## 2023-07-31 LAB — HEMOGLOBIN A1C: Hgb A1c MFr Bld: 6.6 % — ABNORMAL HIGH (ref 4.6–6.5)

## 2023-07-31 NOTE — Assessment & Plan Note (Signed)
Chronic, stable. Will request recent eye exam results.  Check CMP, CBC, A1c today. Currently controlled with diet. Follow-up in 3 months.

## 2023-07-31 NOTE — Assessment & Plan Note (Signed)
Chronic, not controlled.  She has been having some trouble sleeping since stopping the hydrocodone.  She was prescribed doxepin last visit, however she was nervous to try it because of the side effects.  She states that she is still having trouble sleeping and will try the doxepin 3 mg soon.  Follow-up in 3 months.

## 2023-07-31 NOTE — Progress Notes (Signed)
Established Patient Office Visit  Subjective   Patient ID: Angel Holland, female    DOB: December 19, 1940  Age: 82 y.o. MRN: [id number]  Chief Complaint  Patient presents with   Diabetes    Follow up, Flu Vaccine, and concerns with lip swelling, left thumb pain    HPI  Angel Holland is here to follow-up on diabetes and insomnia.   Last visit she was started on doxepin to help with sleep.  She states that she did not start this because she was reading side effects which can cause blisters in the mouth and peeling skin which she is already dealing with.  She went to a dermatologist for follow-up and also mentioned the blisters and lips peeling.  She was started on cortibalm which has really helped her lips become less red and not peel as much.  She is also using Aquaphor in between.  She did not start the meloxicam either as she was afraid of the similar side effects.  She would like to wait for her lips to heal further before starting either new medication.  She states that she is still having a lot of pain.  She also noticed that she is having pain and trouble bending her left thumb.  She remembers injuring it but does not remember when or how.  She denies pain unless she is trying to bend it.  She states that it does pop at times.  She does not check her sugars at home. She denies chest pain, shortness of breath, and polyuria/polydipsia.     ROS See pertinent positives and negatives per HPI.    Objective:     BP 122/70 (BP Location: Left Arm)   Pulse 75   Temp 97.6 F (36.4 C) (Oral)   Ht 4' 10.5" (1.486 m)   Wt 130 lb 9.6 oz (59.2 kg)   SpO2 95%   BMI 26.83 kg/m  BP Readings from Last 3 Encounters:  07/31/23 122/70  06/15/23 120/70  04/30/23 104/78   Wt Readings from Last 3 Encounters:  07/31/23 130 lb 9.6 oz (59.2 kg)  06/15/23 130 lb 8 oz (59.2 kg)  04/30/23 124 lb (56.2 kg)      Physical Exam Vitals and nursing note reviewed.  Constitutional:      General: She is  not in acute distress.    Appearance: Normal appearance.  HENT:     Head: Normocephalic.  Eyes:     Conjunctiva/sclera: Conjunctivae normal.  Cardiovascular:     Rate and Rhythm: Normal rate and regular rhythm.     Pulses: Normal pulses.     Heart sounds: Normal heart sounds.  Pulmonary:     Effort: Pulmonary effort is normal.     Breath sounds: Normal breath sounds.  Musculoskeletal:        General: No swelling or tenderness.     Cervical back: Normal range of motion.     Comments: Limited ROM to left thumb  Skin:    General: Skin is warm.  Neurological:     General: No focal deficit present.     Mental Status: She is alert and oriented to person, place, and time.  Psychiatric:        Mood and Affect: Mood normal.        Behavior: Behavior normal.        Thought Content: Thought content normal.        Judgment: Judgment normal.     Assessment & Plan:  Problem List Items Addressed This Visit       Endocrine   Diabetes mellitus type II, controlled (HCC) - Primary    Chronic, stable. Will request recent eye exam results.  Check CMP, CBC, A1c today. Currently controlled with diet. Follow-up in 3 months.       Relevant Orders   CBC with Differential/Platelet   Comprehensive metabolic panel   Hemoglobin A1c   Hyperlipidemia associated with type 2 diabetes mellitus (HCC)    Chronic, stable.  Check CMP, CBC, lipid panel today.  Continue aspirin 81 mg daily.      Relevant Orders   CBC with Differential/Platelet   Comprehensive metabolic panel   Lipid panel     Other   Primary insomnia    Chronic, not controlled.  She has been having some trouble sleeping since stopping the hydrocodone.  She was prescribed doxepin last visit, however she was nervous to try it because of the side effects.  She states that she is still having trouble sleeping and will try the doxepin 3 mg soon.  Follow-up in 3 months.      Other Visit Diagnoses     Pain of left thumb       She can  start meloxicam 7.5 mg daily as ordered last visit.  Can also use Voltaren gel/ice.  Declines imaging today.   Immunization due       Flu vaccine given today   Relevant Orders   Flu Vaccine Trivalent High Dose (Fluad) (Completed)       Return in about 3 months (around 10/31/2023) for Diabetes.    Gerre Scull, NP

## 2023-07-31 NOTE — Assessment & Plan Note (Signed)
Chronic, stable.  Check CMP, CBC, lipid panel today.  Continue aspirin 81 mg daily.

## 2023-07-31 NOTE — Patient Instructions (Signed)
It was great to see you!  We are checking your labs today and will let you know the results via mychart/phone.   You can start the doxepin and meloxicam when you are ready  For your thumb, use ice for 5-10 minutes a few times a day and voltaren gel 4 times a day as needed. You can get this at any pharmacy.   Let's follow-up in 3 months, sooner if you have concerns.  If a referral was placed today, you will be contacted for an appointment. Please note that routine referrals can sometimes take up to 3-4 weeks to process. Please call our office if you haven't heard anything after this time frame.  Take care,  Rodman Pickle, NP

## 2023-08-01 ENCOUNTER — Encounter: Payer: Self-pay | Admitting: Nurse Practitioner

## 2023-08-10 ENCOUNTER — Other Ambulatory Visit: Payer: Self-pay | Admitting: Nurse Practitioner

## 2023-08-10 NOTE — Telephone Encounter (Signed)
Requesting: DILTIAZEM CD 120MG  CAPSULES (24 HR)  Last Visit: 07/31/2023 Next Visit: 11/01/2023 Last Refill: 08/22/2022  Please Advise

## 2023-08-21 ENCOUNTER — Other Ambulatory Visit: Payer: Self-pay | Admitting: Nurse Practitioner

## 2023-08-22 DIAGNOSIS — Z1231 Encounter for screening mammogram for malignant neoplasm of breast: Secondary | ICD-10-CM | POA: Diagnosis not present

## 2023-08-22 LAB — HM MAMMOGRAPHY

## 2023-08-23 ENCOUNTER — Encounter: Payer: Self-pay | Admitting: Nurse Practitioner

## 2023-10-10 DIAGNOSIS — H353132 Nonexudative age-related macular degeneration, bilateral, intermediate dry stage: Secondary | ICD-10-CM | POA: Diagnosis not present

## 2023-10-10 DIAGNOSIS — H40013 Open angle with borderline findings, low risk, bilateral: Secondary | ICD-10-CM | POA: Diagnosis not present

## 2023-11-01 ENCOUNTER — Encounter: Payer: Self-pay | Admitting: Nurse Practitioner

## 2023-11-01 ENCOUNTER — Ambulatory Visit: Payer: Medicare Other | Admitting: Nurse Practitioner

## 2023-11-01 VITALS — BP 122/68 | HR 77 | Temp 96.3°F | Ht 58.5 in | Wt 134.8 lb

## 2023-11-01 DIAGNOSIS — R159 Full incontinence of feces: Secondary | ICD-10-CM

## 2023-11-01 DIAGNOSIS — R2681 Unsteadiness on feet: Secondary | ICD-10-CM | POA: Diagnosis not present

## 2023-11-01 DIAGNOSIS — E785 Hyperlipidemia, unspecified: Secondary | ICD-10-CM

## 2023-11-01 DIAGNOSIS — I480 Paroxysmal atrial fibrillation: Secondary | ICD-10-CM | POA: Diagnosis not present

## 2023-11-01 DIAGNOSIS — M545 Low back pain, unspecified: Secondary | ICD-10-CM

## 2023-11-01 DIAGNOSIS — R1310 Dysphagia, unspecified: Secondary | ICD-10-CM | POA: Insufficient documentation

## 2023-11-01 DIAGNOSIS — E1169 Type 2 diabetes mellitus with other specified complication: Secondary | ICD-10-CM

## 2023-11-01 DIAGNOSIS — I700 Atherosclerosis of aorta: Secondary | ICD-10-CM | POA: Diagnosis not present

## 2023-11-01 DIAGNOSIS — E119 Type 2 diabetes mellitus without complications: Secondary | ICD-10-CM

## 2023-11-01 DIAGNOSIS — G8929 Other chronic pain: Secondary | ICD-10-CM

## 2023-11-01 NOTE — Assessment & Plan Note (Signed)
 She reports episodes of bowel incontinence. She has stopped her benfiber which helped for a short period of time, but it started again. Encouraged her to keep her appointment with GI in a few weeks.

## 2023-11-01 NOTE — Assessment & Plan Note (Addendum)
 History of low back pain from an injury, without sciatica at this point.  She follows with Dr. Bonner with orthopedics and has an appointment with him on Monday.  She takes hydrocodone -ibuprofen  5-200 mg at bedtime.  Continue recommendations and collaboration from orthopedics. Meloxicam  has helped with pain as well and sleep. Continue meloxicam  7.5mg  daily as needed

## 2023-11-01 NOTE — Assessment & Plan Note (Signed)
 She reports difficulty swallowing pills, which has led to weight gain due to increased food intake to aid in swallowing. We will consider a pill crusher or chewable forms of medications and suggest trying carbonated drinks to aid swallowing.

## 2023-11-01 NOTE — Progress Notes (Signed)
 Established Patient Office Visit  Subjective   Patient ID: Angel Holland, female    DOB: 30-Sep-1941  Age: 83 y.o. MRN: [id number]  Chief Complaint  Patient presents with   Diabetes    Follow up, trouble swallowing pills that are not coated    HPI  Discussed the use of AI scribe software for clinical note transcription with the patient, who gave verbal consent to proceed.  History of Present Illness   Angel Holland, a patient with a history of back pain and macular degeneration, presents with difficulty swallowing her pills and vitamins for the past year. She reports choking on non-coated pills, particularly her vitamin C  and PrimaVision. She has tried various strategies to aid swallowing, including splitting pills, taking them with different types of food and drink, and even chewing them, but these have not been consistently successful. She also reports a significant increase in appetite and weight gain since a surgery she had in May.  In addition to her swallowing difficulties, Miss Angel Holland has been experiencing bowel incontinence. She reports having accidents, sometimes even during sleep, and has been struggling with alternating constipation and diarrhea. She has tried adjusting her diet and stopped taking Benefiber, which initially seemed to help, but the problem has persisted.  Miss Angel Holland also mentions a recent issue with stumbling, which has led her to use a cane for support. She reports no weakness or confusion, but does note occasional forgetfulness, particularly with word recall.        ROS See pertinent positives and negatives per HPI.    Objective:     BP 122/68 (BP Location: Left Arm, Patient Position: Sitting, Cuff Size: Small)   Pulse 77   Temp (!) 96.3 F (35.7 C)   Ht 4' 10.5 (1.486 m)   Wt 134 lb 12.8 oz (61.1 kg)   SpO2 95%   BMI 27.69 kg/m  BP Readings from Last 3 Encounters:  11/01/23 122/68  07/31/23 122/70  06/15/23 120/70   Wt Readings from Last 3  Encounters:  11/01/23 134 lb 12.8 oz (61.1 kg)  07/31/23 130 lb 9.6 oz (59.2 kg)  06/15/23 130 lb 8 oz (59.2 kg)      Physical Exam Vitals and nursing note reviewed.  Constitutional:      General: She is not in acute distress.    Appearance: Normal appearance.  HENT:     Head: Normocephalic.  Eyes:     Conjunctiva/sclera: Conjunctivae normal.  Cardiovascular:     Rate and Rhythm: Normal rate and regular rhythm.     Pulses: Normal pulses.     Heart sounds: Normal heart sounds.  Pulmonary:     Effort: Pulmonary effort is normal.     Breath sounds: Normal breath sounds.  Musculoskeletal:     Cervical back: Normal range of motion.  Skin:    General: Skin is warm.  Neurological:     General: No focal deficit present.     Mental Status: She is alert and oriented to person, place, and time.     Motor: No weakness.     Gait: Gait normal.  Psychiatric:        Mood and Affect: Mood normal.        Behavior: Behavior normal.        Thought Content: Thought content normal.        Judgment: Judgment normal.       Assessment & Plan:   Problem List Items Addressed This Visit  Cardiovascular and Mediastinum   Aortic atherosclerosis (HCC)   Noted on imaging in the past.  She has been watching her diet and controlling her cholesterol with diet alone.       PAF (paroxysmal atrial fibrillation) (HCC)   Chronic, stable. She is taking diltiazem  120 mg daily.  We will continue this regimen.  She is not on anticoagulation due to family members having bleeding issues with this in the past.  Continue aspirin  81 mg daily.  Follow-up in 6 months or sooner with concerns.        Endocrine   Diabetes mellitus type II, controlled (HCC)   Chronic, stable. Currently controlled with diet. Follow-up in 3 months.       Hyperlipidemia associated with type 2 diabetes mellitus (HCC)   Chronic, stable. Continue aspirin  81 mg daily.        Other   Low back pain - Primary   History of  low back pain from an injury, without sciatica at this point.  She follows with Dr. Bonner with orthopedics and has an appointment with him on Monday.  She takes hydrocodone -ibuprofen  5-200 mg at bedtime.  Continue recommendations and collaboration from orthopedics. Meloxicam  has helped with pain as well and sleep. Continue meloxicam  7.5mg  daily as needed      Difficulty swallowing pills   She reports difficulty swallowing pills, which has led to weight gain due to increased food intake to aid in swallowing. We will consider a pill crusher or chewable forms of medications and suggest trying carbonated drinks to aid swallowing.      Incontinence of feces   She reports episodes of bowel incontinence. She has stopped her benfiber which helped for a short period of time, but it started again. Encouraged her to keep her appointment with GI in a few weeks.       Gait instability   She reports stumbling and difficulty walking but declined physical therapy. Continue using cane for support. Symptoms will continue to be monitored.        Return in about 3 months (around 01/30/2024) for routine follow-up.    Tinnie Angel Harada, NP

## 2023-11-01 NOTE — Assessment & Plan Note (Signed)
Chronic, stable  Continue aspirin 81 mg daily

## 2023-11-01 NOTE — Assessment & Plan Note (Signed)
Noted on imaging in the past.  She has been watching her diet and controlling her cholesterol with diet alone.  ?

## 2023-11-01 NOTE — Assessment & Plan Note (Signed)
 She reports stumbling and difficulty walking but declined physical therapy. Continue using cane for support. Symptoms will continue to be monitored.

## 2023-11-01 NOTE — Patient Instructions (Signed)
 It was great to see you!  You can soda to take your medicines or see if they come in chewable. You can also try to put your tongue on the roof of your mouth before you swallow  Keep your appointment with GI  Let's follow-up in 3 months, sooner if you have concerns.  If a referral was placed today, you will be contacted for an appointment. Please note that routine referrals can sometimes take up to 3-4 weeks to process. Please call our office if you haven't heard anything after this time frame.  Take care,  Tinnie Harada, NP

## 2023-11-01 NOTE — Assessment & Plan Note (Signed)
 Chronic, stable. She is taking diltiazem 120 mg daily.  We will continue this regimen.  She is not on anticoagulation due to family members having bleeding issues with this in the past.  Continue aspirin 81 mg daily.  Follow-up in 6 months or sooner with concerns.

## 2023-11-01 NOTE — Assessment & Plan Note (Signed)
 Chronic, stable. Currently controlled with diet. Follow-up in 3 months.

## 2023-11-15 ENCOUNTER — Ambulatory Visit: Payer: Medicare Other | Admitting: Gastroenterology

## 2023-11-15 ENCOUNTER — Encounter: Payer: Self-pay | Admitting: Gastroenterology

## 2023-11-15 VITALS — BP 150/80 | HR 78 | Ht 58.5 in | Wt 135.5 lb

## 2023-11-15 DIAGNOSIS — R159 Full incontinence of feces: Secondary | ICD-10-CM

## 2023-11-15 DIAGNOSIS — R197 Diarrhea, unspecified: Secondary | ICD-10-CM

## 2023-11-15 DIAGNOSIS — R1310 Dysphagia, unspecified: Secondary | ICD-10-CM | POA: Diagnosis not present

## 2023-11-15 DIAGNOSIS — K915 Postcholecystectomy syndrome: Secondary | ICD-10-CM

## 2023-11-15 MED ORDER — CHOLESTYRAMINE 4 G PO PACK: 4.0000 g | PACK | Freq: Every day | ORAL | 1 refills | Status: DC

## 2023-11-15 NOTE — Progress Notes (Signed)
Discussed the use of AI scribe software for clinical note transcription with the patient, who gave verbal consent to proceed.  HPI : Angel Holland is a 83 y.o. female with a history of anxiety, diabetes, IBS and severe diverticulosis who presents for follow up of IBS and chronic diarrhea with incontinence.  She was previously followed by Dr. Russella Dar and was last seen by him in Jan 2024.  At that visit, she was complaining of difficult swallowing her dicyclomine tablet and so her dose was reduced from 20 mg to 10 mg.  Today, she reports worsening symptoms of diarrhea, urgency and incontinence.. The patient reports episodes of sudden, uncontrollable bowel movements, occurring without warning, and leading to significant distress and lifestyle disruption. These episodes can occur as frequently as twice a week or as infrequently as once a month, but the patient notes an increasing frequency in general over the past few years.  Her stools are often loose and poorly formed when she has incontinence.  Otherwise her stools are usually solid.  She has not noted any particular foods that trigger the diarrhea/incontinence.  She denies any abdominal pain.  No nocturnal incontinence.  No seepage or small volume incontinence.    The onset of diarrhea was approximately ten years ago, around the same time the patient started taking dicyclomine. The patient has been taking Benefiber for about forty years, but stopped she heard it can cause diarrhea. The patient also reports a history of diverticulitis and has been avoiding certain foods (popcorn, seeds/nuts) due to this diagnosis.  The patient has been taking dicyclomine for many years, initially prescribed for irritable bowel syndrome. However, the patient reports no improvement in symptoms and has experienced side effects such as dry mouth. The patient has reduced the dosage from four to two tablets daily, but the dry mouth persists and the diarrhea has worsened.  The  patient also reports difficulty swallowing uncoated pills, which has led to a complex pill-taking routine involving various foods and drinks. This issue has been present since a hospital stay for diverticulitis and bowel obstruction, during which the patient was required to take large pills. The patient does not report any difficulty swallowing food or liquids, only pills  The patient has a history of an allergic reaction to hydrocodone, which manifested as white blisters in the mouth, tongue swelling, and lip swelling. The patient also reports sleep disturbances since discontinuing hydrocodone, and currently takes meloxicam to aid sleep.  The patient's medication regimen is complex and spread throughout the day, with some medications taken in the morning, some in the afternoon, and meloxicam taken at night for sleep.  She spreads out her medications because it is so difficult for her to swallow pills.          Colonoscopy Feb 2019: severe left colon diverticulosis and internal hemorrhoids    Past Medical History:  Diagnosis Date   Angio-edema    Anxiety    no meds   Arthritis    back and fingers    basal cell carcinoma    Bruxism    Colon polyps    DDD (degenerative disc disease), lumbar    DDD (degenerative disc disease), lumbosacral    Depression    no meds   Diabetes mellitus without complication Type 2    Diverticulosis of colon (without mention of hemorrhage)    DJD (degenerative joint disease)    lower back   Dysphagia    trouble with very  large pills @  times   Dysrhythmia    Fibroid uterus    Hemorrhoids    History of COVID-19 09/2022   History of migraine    age 6's none since   Hx of diverticulitis of colon 09/2022   resolved   Hypertension    Irritable bowel syndrome with diarrhea and fecal incontinence    PAF (paroxysmal atrial fibrillation) (HCC) 01/2018   Pulmonary nodules - reports followed by pulmonology, reports following up with them in June 2015  03/26/2013   Repeat CT chest 04/20/14 > 1. Scattered pulmonary nodules measure 4 mm or less in size, are unchanged from 03/28/2013 and are therefore considered benign     Rosacea    Wears glasses      Past Surgical History:  Procedure Laterality Date   APPENDECTOMY  1957   CATARACT EXTRACTION     Left June 2021-Right 05/2020   CHOLECYSTECTOMY  2009   colonscopy  11/2017   DILATION AND CURETTAGE OF UTERUS     fibroid removed from uterus  1967   HYSTEROSCOPY WITH D & C N/A 09/09/2015   Procedure: DILATATION AND CURETTAGE /HYSTEROSCOPY;  Surgeon: Waynard Reeds, MD;  Location: [facility];  Service: Gynecology;  Laterality: N/A;   HYSTEROSCOPY WITH D & C N/A 03/05/2023   Procedure: DILATATION AND CURETTAGE /HYSTEROSCOPY;  Surgeon: Sherian Rein, MD;  Location: [facility];  Service: Gynecology;  Laterality: N/A;   TONSILLECTOMY     Family History  Problem Relation Age of Onset   Heart disease Mother        CHF   Diverticulosis Mother    Bone cancer Father        found in his mouth first   Colon cancer Neg Hx    Stomach cancer Neg Hx    Esophageal cancer Neg Hx    Pancreatic cancer Neg Hx    Colon polyps Neg Hx    Social History   Tobacco Use   Smoking status: Former    Current packs/day: 0.00    Average packs/day: 1 pack/day for 50.0 years (50.0 ttl pk-yrs)    Types: Cigarettes    Start date: 07/23/1958    Quit date: 07/23/2008    Years since quitting: 15.3    Passive exposure: Never   Smokeless tobacco: Never  Vaping Use   Vaping status: Never Used  Substance Use Topics   Alcohol use: No   Drug use: No   Current Outpatient Medications  Medication Sig Dispense Refill   AMBULATORY NON FORMULARY MEDICATION Take 2 capsules by mouth daily. Medication Name: Osteo Matrix-Shaklee     Ascorbic Acid (VITAMIN C PO) Take 1,500 mg by mouth 2 (two) times daily with a meal.     aspirin EC 81 MG tablet Take 81 mg by mouth See admin instructions. Take one tablet (81  mg) by mouth every morning, may also take one tablet (81 mg) at night as needed for pain/sleep     Cyanocobalamin (VITAMIN B-12 PO) Take 130 mg by mouth daily with supper.     dicyclomine (BENTYL) 10 MG capsule Take 1 capsule (10 mg total) by mouth in the morning and at bedtime. TAKE 1 CAPSULE BY MOUTH FOUR TIMES DAILY BEFORE MEALS AND AT BEDTIME 60 capsule 11   diltiazem (CARDIZEM CD) 120 MG 24 hr capsule TAKE 1 CAPSULE(120 MG) BY MOUTH DAILY 90 capsule 3   meloxicam (MOBIC) 7.5 MG tablet TAKE 1 TABLET(7.5 MG) BY MOUTH DAILY 90 tablet 1   Multiple Vitamins-Minerals (  PRESERVISION AREDS PO) Take by mouth.     triamcinolone (NASACORT) 55 MCG/ACT AERO nasal inhaler Place 2 sprays into the nose daily as needed (congestion/sinus headache).     VITAMIN D PO Take 1,000 Units by mouth daily with breakfast.     No current facility-administered medications for this visit.   Allergies  Allergen Reactions   Hydrocodone Swelling    Lip Swelling    Biaxin [Clarithromycin] Other (See Comments)    BREASTS INFLAMED   Codeine Other (See Comments)    PANIC ATTACKS, CAN'T SLEEP   Decongestant [Pseudoephedrine Hcl Er] Other (See Comments)    Cannot take with Dilitiazem   Mobic [Meloxicam] Swelling    Lip swelling   Phenylephrine Other (See Comments)    Cannot take with Dilitiazem   Rosuvastatin Nausea And Vomiting and Other (See Comments)   Acetaminophen Other (See Comments)    Triggered her "heart problem". Referring to atrial fibrillation     Review of Systems: All systems reviewed and negative except where noted in HPI.    No results found.  Physical Exam: BP (!) 150/80   Pulse 78   Ht 4' 10.5" (1.486 m)   Wt 135 lb 8 oz (61.5 kg)   BMI 27.84 kg/m  Constitutional: Pleasant,well-developed, elderly Caucasian female in no acute distress. HEENT: Normocephalic and atraumatic. Conjunctivae are normal. No scleral icterus. Neck supple.  Cardiovascular: Normal rate, regular rhythm.   Pulmonary/chest: Effort normal and breath sounds normal. No wheezing, rales or rhonchi. Abdominal: Soft, nondistended, nontender. Bowel sounds active throughout. There are no masses palpable. No hepatomegaly. Extremities: no edema Neurological: Alert and oriented to person place and time. Skin: Skin is warm and dry. No rashes noted. Psychiatric: Appears anxious. Behavior is normal.  CBC    Component Value Date/Time   WBC 8.7 07/31/2023 1109   RBC 4.47 07/31/2023 1109   HGB 12.7 07/31/2023 1109   HCT 39.6 07/31/2023 1109   PLT 293.0 07/31/2023 1109   MCV 88.5 07/31/2023 1109   MCH 29.2 03/05/2023 1030   MCHC 32.0 07/31/2023 1109   RDW 14.3 07/31/2023 1109   LYMPHSABS 1.7 07/31/2023 1109   MONOABS 0.7 07/31/2023 1109   EOSABS 0.0 07/31/2023 1109   BASOSABS 0.1 07/31/2023 1109    CMP     Component Value Date/Time   NA 138 07/31/2023 1109   K 4.3 07/31/2023 1109   CL 103 07/31/2023 1109   CO2 26 07/31/2023 1109   GLUCOSE 101 (H) 07/31/2023 1109   BUN 16 07/31/2023 1109   CREATININE 0.74 07/31/2023 1109   CALCIUM 9.6 07/31/2023 1109   PROT 6.9 07/31/2023 1109   ALBUMIN 4.2 07/31/2023 1109   AST 13 07/31/2023 1109   ALT 13 07/31/2023 1109   ALKPHOS 79 07/31/2023 1109   BILITOT 0.3 07/31/2023 1109   GFRNONAA >60 11/02/2022 0309   GFRAA >60 01/30/2020 1621       Latest Ref Rng & Units 07/31/2023   11:09 AM 03/05/2023   10:48 AM 03/05/2023   10:36 AM  CBC EXTENDED  WBC 4.0 - 10.5 K/uL 8.7     RBC 3.87 - 5.11 Mil/uL 4.47     Hemoglobin 12.0 - 15.0 g/dL 16.1  09.6  04.5   HCT 36.0 - 46.0 % 39.6  41.0  43.0   Platelets 150.0 - 400.0 K/uL 293.0     NEUT# 1.4 - 7.7 K/uL 6.1     Lymph# 0.7 - 4.0 K/uL 1.7  ASSESSMENT AND PLAN:  83 year old female with chronic history of IBS/incontinence, with increasing episodes of incontinence over the past year or so.    Chronic Diarrhea with Fecal Incontinence Chronic diarrhea and fecal incontinence for several years,  worsening over time.  History of diverticulitis and cholecystectomy may contribute. Dicyclomine ineffective and causes xerostomia. Patient denies abdominal pain.   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 Cholestyramine recommended to bind excess bile acids. Discussed risks of cholestyramine interfering with medication absorption and the need to take it 1 hour after her other medications or 4 hours before.  Recommended avoiding fatty/greasy foods, sugary foods/artificial sweeteners. - Ok to discontinue dicyclomine given absence of abdominal pain and suspected side effects of xerostomia - Prescribe cholestyramine powder - Instruct to take cholestyramine at bedtime, one hour after other medications.  If able, would recommend adding a morning dose - Avoid fatty/greasy foods, sugary foods and sugar-substitutes which can worsened diarrhea. - Follow up in six weeks  Severe diverticulosis with history of diverticulitis Diverticulitis confirmed by CT scan in December 2023.  - Recommend high fiber diet  Dysphagia for Pills Difficulty swallowing uncoated pills, leading to choking episodes. Uses applesauce and potato chips to aid swallowing. Suspected functional issue as there is no trouble swallowing food. Discussed healthier alternatives for pill swallowing. - Recommend continuing to use food to aid in swallowing pills - Suggest low calorie/healthier alternatives like rice cakes for pill swallowing  Belmont Valli E. Tomasa Rand, MD [facility]  I spent a total of 40 minutes reviewing the patient's medical record, interviewing and examining the patient, discussing her diagnosis and management of her condition going forward, and documenting in the medical record     McElwee, Jake Church, NP

## 2023-11-15 NOTE — Patient Instructions (Addendum)
We have sent the following medications to your pharmacy for you to pick up at your convenience: Cholestyramine 4 mg .  You have a follow up appointment 01/01/24 at 11:10am.  _______________________________________________________  If your blood pressure at your visit was 140/90 or greater, please contact your primary care physician to follow up on this.  _______________________________________________________  If you are age 84 or older, your body mass index should be between 23-30. Your Body mass index is 27.84 kg/m. If this is out of the aforementioned range listed, please consider follow up with your Primary Care Provider.  If you are age 77 or younger, your body mass index should be between 19-25. Your Body mass index is 27.84 kg/m. If this is out of the aformentioned range listed, please consider follow up with your Primary Care Provider.   ________________________________________________________  The [facility] providers would like to encourage you to use [facility] to communicate with providers for non-urgent requests or questions.  Due to long hold times on the telephone, sending your provider a message by [facility] may be a faster and more efficient way to get a response.  Please allow 48 business hours for a response.  Please remember that this is for non-urgent requests.   It was a pleasure to see you today!  Thank you for trusting me with your gastrointestinal care!    Scott E. Tomasa Rand, MD

## 2023-11-20 ENCOUNTER — Telehealth: Payer: Self-pay | Admitting: Gastroenterology

## 2023-11-20 NOTE — Telephone Encounter (Signed)
Inbound call from patient, states she has questions about her cholestyramine medication recently prescribed by Dr. Tomasa Rand, She states she has read the side effects on this medication and that it may cause damage on periodontal teeth. She would like to know if this is correct, if so, discuss alternatives.

## 2023-11-20 NOTE — Telephone Encounter (Signed)
See note below from pt regarding her concerns with cholestyramine.

## 2023-11-21 NOTE — Telephone Encounter (Signed)
Spoke with pt and she is aware of recommendations per Dr. Tomasa Rand. She will continue to rinse with mouthwash and brush her teeth after taking the medication.

## 2023-12-06 DIAGNOSIS — L820 Inflamed seborrheic keratosis: Secondary | ICD-10-CM | POA: Diagnosis not present

## 2023-12-06 DIAGNOSIS — K130 Diseases of lips: Secondary | ICD-10-CM | POA: Diagnosis not present

## 2023-12-06 DIAGNOSIS — Z85828 Personal history of other malignant neoplasm of skin: Secondary | ICD-10-CM | POA: Diagnosis not present

## 2024-01-01 ENCOUNTER — Encounter: Payer: Self-pay | Admitting: Gastroenterology

## 2024-01-01 ENCOUNTER — Ambulatory Visit: Payer: Medicare Other | Admitting: Gastroenterology

## 2024-01-01 VITALS — BP 128/70 | HR 84 | Ht <= 58 in | Wt 137.0 lb

## 2024-01-01 DIAGNOSIS — K580 Irritable bowel syndrome with diarrhea: Secondary | ICD-10-CM

## 2024-01-01 DIAGNOSIS — K056 Periodontal disease, unspecified: Secondary | ICD-10-CM | POA: Diagnosis not present

## 2024-01-01 DIAGNOSIS — R519 Headache, unspecified: Secondary | ICD-10-CM

## 2024-01-01 DIAGNOSIS — K915 Postcholecystectomy syndrome: Secondary | ICD-10-CM | POA: Diagnosis not present

## 2024-01-01 DIAGNOSIS — Z87891 Personal history of nicotine dependence: Secondary | ICD-10-CM

## 2024-01-01 DIAGNOSIS — R197 Diarrhea, unspecified: Secondary | ICD-10-CM | POA: Diagnosis not present

## 2024-01-01 NOTE — Progress Notes (Signed)
 Discussed the use of AI scribe software for clinical note transcription with the patient, who gave verbal consent to proceed.  HPI : Angel Holland is a 83 y.o. female with a history of anxiety, diabetes, IBD and severe diverticulosis who presents for follow-up of diarrhea and incontinence.  She was last seen by me January 16th, at which time I recommended she try taking cholestyramine to help with her diarrhea and urgency/incontinence, given her history of cholecystectomy.  Today, she reports she has experienced significant improvement in bowel control since starting cholestyramine, which she takes at 9 PM. Initially, she mixed it with milk but now uses light orange juice, drinking it quickly through a straw to minimize contact with her teeth to minimize potential dental discoloration. Despite improvements, she remains cautious about going out without protection due to fear of accidents. Her bowel movements have become more formed and solid, occurring multiple times a day. She still experiences pressure in the rectal area when walking, particularly in public places, but denies any incontinence. Initially, she experienced leakage of a paste-like substance, which has since resolved.   She experiences occasional headaches, approximately once a week, which she manages with as needed ibuprofen. She has not had headaches in the past 40 years until starting this medication.  She is concerned about potential dental discoloration due to her long-standing periodontal condition, for which she sees a periodontist biannually. She takes measures to protect her teeth, including rinsing with water and biotin, and brushing her teeth after taking the medication.  Her current medications include diltiazem for high blood pressure, taken between 4 and 5 PM, and ibuprofen as needed for headaches. She has discontinued dicyclomine without side effects.  She denies any problems with abdominal pain.   Past Medical History:   Diagnosis Date   Angio-edema    Anxiety    no meds   Arthritis    back and fingers    basal cell carcinoma    Bruxism    Colon polyps    DDD (degenerative disc disease), lumbar    DDD (degenerative disc disease), lumbosacral    Depression    no meds   Diabetes mellitus without complication Type 2    Diverticulosis of colon (without mention of hemorrhage)    DJD (degenerative joint disease)    lower back   Dysphagia    trouble with very  large pills @ times   Dysrhythmia    Fibroid uterus    Hemorrhoids    History of COVID-19 09/2022   History of migraine    age 71's none since   Hx of diverticulitis of colon 09/2022   resolved   Hypertension    Irritable bowel syndrome with diarrhea and fecal incontinence    PAF (paroxysmal atrial fibrillation) (HCC) 01/2018   Pulmonary nodules - reports followed by pulmonology, reports following up with them in June 2015 03/26/2013   Repeat CT chest 04/20/14 > 1. Scattered pulmonary nodules measure 4 mm or less in size, are unchanged from 03/28/2013 and are therefore considered benign     Rosacea    Wears glasses      Past Surgical History:  Procedure Laterality Date   APPENDECTOMY  1957   CATARACT EXTRACTION     Left June 2021-Right 05/2020   CHOLECYSTECTOMY  2009   colonscopy  11/2017   DILATION AND CURETTAGE OF UTERUS     fibroid removed from uterus  1967   HYSTEROSCOPY WITH D & C N/A 09/09/2015  Procedure: DILATATION AND CURETTAGE /HYSTEROSCOPY;  Surgeon: Waynard Reeds, MD;  Location: [facility];  Service: Gynecology;  Laterality: N/A;   HYSTEROSCOPY WITH D & C N/A 03/05/2023   Procedure: DILATATION AND CURETTAGE /HYSTEROSCOPY;  Surgeon: Sherian Rein, MD;  Location: [facility];  Service: Gynecology;  Laterality: N/A;   TONSILLECTOMY     Family History  Problem Relation Age of Onset   Heart disease Mother        CHF   Diverticulosis Mother    Bone cancer Father        found in his mouth first   Colon  cancer Neg Hx    Stomach cancer Neg Hx    Esophageal cancer Neg Hx    Pancreatic cancer Neg Hx    Colon polyps Neg Hx    Social History   Tobacco Use   Smoking status: Former    Current packs/day: 0.00    Average packs/day: 1 pack/day for 50.0 years (50.0 ttl pk-yrs)    Types: Cigarettes    Start date: 07/23/1958    Quit date: 07/23/2008    Years since quitting: 15.4    Passive exposure: Never   Smokeless tobacco: Never  Vaping Use   Vaping status: Never Used  Substance Use Topics   Alcohol use: No   Drug use: No   Current Outpatient Medications  Medication Sig Dispense Refill   AMBULATORY NON FORMULARY MEDICATION Take 2 capsules by mouth daily. Medication Name: Osteo Matrix-Shaklee     Ascorbic Acid (VITAMIN C PO) Take 1,500 mg by mouth 2 (two) times daily with a meal.     aspirin EC 81 MG tablet Take 81 mg by mouth See admin instructions. Take one tablet (81 mg) by mouth every morning, may also take one tablet (81 mg) at night as needed for pain/sleep     cholestyramine (QUESTRAN) 4 g packet Take 1 packet (4 g total) by mouth at bedtime. 60 each 1   Cyanocobalamin (VITAMIN B-12 PO) Take 130 mg by mouth daily with supper.     diltiazem (CARDIZEM CD) 120 MG 24 hr capsule TAKE 1 CAPSULE(120 MG) BY MOUTH DAILY 90 capsule 3   meloxicam (MOBIC) 7.5 MG tablet TAKE 1 TABLET(7.5 MG) BY MOUTH DAILY 90 tablet 1   Multiple Vitamins-Minerals (PRESERVISION AREDS PO) Take by mouth.     triamcinolone (NASACORT) 55 MCG/ACT AERO nasal inhaler Place 2 sprays into the nose daily as needed (congestion/sinus headache).     VITAMIN D PO Take 1,000 Units by mouth daily with breakfast.     dicyclomine (BENTYL) 10 MG capsule Take 1 capsule (10 mg total) by mouth in the morning and at bedtime. TAKE 1 CAPSULE BY MOUTH FOUR TIMES DAILY BEFORE MEALS AND AT BEDTIME (Patient not taking: Reported on 01/01/2024) 60 capsule 11   No current facility-administered medications for this visit.   Allergies  Allergen  Reactions   Hydrocodone Swelling    Lip Swelling    Biaxin [Clarithromycin] Other (See Comments)    BREASTS INFLAMED   Codeine Other (See Comments)    PANIC ATTACKS, CAN'T SLEEP   Decongestant [Pseudoephedrine Hcl Er] Other (See Comments)    Cannot take with Dilitiazem   Hydrocodone-Ibuprofen    Mobic [Meloxicam] Swelling    Lip swelling   Phenylephrine Other (See Comments)    Cannot take with Dilitiazem   Rosuvastatin Nausea And Vomiting and Other (See Comments)   Acetaminophen Other (See Comments)    Triggered her "heart problem". Referring  to atrial fibrillation     Review of Systems: All systems reviewed and negative except where noted in HPI.    No results found.  Physical Exam: BP 128/70 (BP Location: Left Arm, Patient Position: Sitting, Cuff Size: Normal)   Pulse 84   Ht 4\' 10"  (1.473 m)   Wt 137 lb (62.1 kg)   BMI 28.63 kg/m  Constitutional: Pleasant,well-developed, Caucasian female in no acute distress. HEENT: Normocephalic and atraumatic. Conjunctivae are normal. No scleral icterus. Neurological: Alert and oriented to person place and time. Skin: Skin is warm and dry. No rashes noted. Psychiatric: Normal mood and affect. Behavior is normal.  CBC    Component Value Date/Time   WBC 8.7 07/31/2023 1109   RBC 4.47 07/31/2023 1109   HGB 12.7 07/31/2023 1109   HCT 39.6 07/31/2023 1109   PLT 293.0 07/31/2023 1109   MCV 88.5 07/31/2023 1109   MCH 29.2 03/05/2023 1030   MCHC 32.0 07/31/2023 1109   RDW 14.3 07/31/2023 1109   LYMPHSABS 1.7 07/31/2023 1109   MONOABS 0.7 07/31/2023 1109   EOSABS 0.0 07/31/2023 1109   BASOSABS 0.1 07/31/2023 1109    CMP     Component Value Date/Time   NA 138 07/31/2023 1109   K 4.3 07/31/2023 1109   CL 103 07/31/2023 1109   CO2 26 07/31/2023 1109   GLUCOSE 101 (H) 07/31/2023 1109   BUN 16 07/31/2023 1109   CREATININE 0.74 07/31/2023 1109   CALCIUM 9.6 07/31/2023 1109   PROT 6.9 07/31/2023 1109   ALBUMIN 4.2 07/31/2023  1109   AST 13 07/31/2023 1109   ALT 13 07/31/2023 1109   ALKPHOS 79 07/31/2023 1109   BILITOT 0.3 07/31/2023 1109   GFRNONAA >60 11/02/2022 0309   GFRAA >60 01/30/2020 1621       Latest Ref Rng & Units 07/31/2023   11:09 AM 03/05/2023   10:48 AM 03/05/2023   10:36 AM  CBC EXTENDED  WBC 4.0 - 10.5 K/uL 8.7     RBC 3.87 - 5.11 Mil/uL 4.47     Hemoglobin 12.0 - 15.0 g/dL 16.1  09.6  04.5   HCT 36.0 - 46.0 % 39.6  41.0  43.0   Platelets 150.0 - 400.0 K/uL 293.0     NEUT# 1.4 - 7.7 K/uL 6.1     Lymph# 0.7 - 4.0 K/uL 1.7         ASSESSMENT AND PLAN:  83 year old female with history of IBS/postcholecystectomy diarrhea, with significant improvement in her diarrhea and urgency with once daily cholestyramine.  She is having some mild side effects (headaches) and has concerns about periodontal complications.  Overall, the clinical benefits of the cholestyramine outweigh these risks  Bile acid malabsorption post-cholecystectomy is causing diarrhea, which has improved with cholestyramine, resulting in solid stools and reduced bowel movement frequency. She experiences rectal pressure when walking but no incontinence. She is concerned about cholestyramine's dental side effects and is taking precautions. She inquired about long-term medication use and was informed that dietary changes might eliminate the need for medication, though many prefer medication over dietary restrictions. - Continue cholestyramine once daily at night. - Suggest taking a single Imodium pill before long outings if concerned about accidents, ensuring not to exceed one pill. -Discussed dietary modifications which may reduce her diarrhea only for cholestyramine, to include diet low in fat/oil/grease, sugar and artificial sweeteners  Headaches Headaches occur approximately once a week since starting cholestyramine. There is conflicting information about whether headaches are a side effect of  cholestyramine. Ibuprofen provides  relief.  Given infrequent and mild headaches, the benefits of cholestyramine appear to outweigh the risks/side effects -Continue as needed ibuprofen for headache  Periodontal disease Long-standing periodontal disease with concerns about potential interactions with cholestyramine. Regular dental check-ups every six months are maintained, and no known interactions with cholestyramine are expected to worsen her periodontal condition. - Continue regular periodontal check-ups (next appointment in June) - Maintain current dental hygiene practices, including rinsing with water and biotin after taking cholestyramine.     Follow-up as needed  Kein Carlberg E. Tomasa Rand, MD [facility]     Gerre Scull, NP

## 2024-01-01 NOTE — Patient Instructions (Signed)
 Continue current medications.  Follow up with [place] as needed.   _______________________________________________________  If your blood pressure at your visit was 140/90 or greater, please contact your primary care physician to follow up on this.  _______________________________________________________  If you are age 84 or older, your body mass index should be between 23-30. Your Body mass index is 28.63 kg/m. If this is out of the aforementioned range listed, please consider follow up with your Primary Care Provider.  If you are age 46 or younger, your body mass index should be between 19-25. Your Body mass index is 28.63 kg/m. If this is out of the aformentioned range listed, please consider follow up with your Primary Care Provider.   ________________________________________________________  The [place] [facility] providers would like to encourage you to use [facility] to communicate with providers for non-urgent requests or questions.  Due to long hold times on the telephone, sending your provider a message by [facility] may be a faster and more efficient way to get a response.  Please allow 48 business hours for a response.  Please remember that this is for non-urgent requests.  _______________________________________________________

## 2024-01-28 ENCOUNTER — Telehealth: Payer: Self-pay | Admitting: Gastroenterology

## 2024-01-28 NOTE — Telephone Encounter (Signed)
 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 However, she has recently started gagging when taking the powder. Also states she has a difficult time with pills which is why we gave her the powder in the first place. Wonders if there is a chewable version of cholestyramine. I have advised that unfortunately, there is only a capsule or powder form of the medication. I recommended she mix the cholestyramine with applesauce or place in her morning coffee to see it this helps the "gagging" sensation. She states she will do this.

## 2024-01-28 NOTE — Telephone Encounter (Signed)
 Patient requesting to speak with a nurse in regards to medication management and symptoms. Please advise.

## 2024-01-30 ENCOUNTER — Encounter: Payer: Self-pay | Admitting: Nurse Practitioner

## 2024-01-30 ENCOUNTER — Ambulatory Visit (INDEPENDENT_AMBULATORY_CARE_PROVIDER_SITE_OTHER): Payer: Medicare Other | Admitting: Nurse Practitioner

## 2024-01-30 VITALS — BP 122/72 | HR 77 | Temp 97.3°F | Ht <= 58 in | Wt 137.2 lb

## 2024-01-30 DIAGNOSIS — E785 Hyperlipidemia, unspecified: Secondary | ICD-10-CM | POA: Diagnosis not present

## 2024-01-30 DIAGNOSIS — I480 Paroxysmal atrial fibrillation: Secondary | ICD-10-CM

## 2024-01-30 DIAGNOSIS — R197 Diarrhea, unspecified: Secondary | ICD-10-CM | POA: Diagnosis not present

## 2024-01-30 DIAGNOSIS — R2689 Other abnormalities of gait and mobility: Secondary | ICD-10-CM | POA: Diagnosis not present

## 2024-01-30 DIAGNOSIS — E1169 Type 2 diabetes mellitus with other specified complication: Secondary | ICD-10-CM

## 2024-01-30 DIAGNOSIS — E119 Type 2 diabetes mellitus without complications: Secondary | ICD-10-CM

## 2024-01-30 LAB — COMPREHENSIVE METABOLIC PANEL WITH GFR
ALT: 15 U/L (ref 0–35)
AST: 15 U/L (ref 0–37)
Albumin: 4.4 g/dL (ref 3.5–5.2)
Alkaline Phosphatase: 55 U/L (ref 39–117)
BUN: 16 mg/dL (ref 6–23)
CO2: 25 meq/L (ref 19–32)
Calcium: 9.7 mg/dL (ref 8.4–10.5)
Chloride: 104 meq/L (ref 96–112)
Creatinine, Ser: 0.83 mg/dL (ref 0.40–1.20)
GFR: 65.24 mL/min (ref >60.00)
Glucose, Bld: 105 mg/dL — ABNORMAL HIGH (ref 70–99)
Potassium: 4.2 meq/L (ref 3.5–5.1)
Sodium: 138 meq/L (ref 135–145)
Total Bilirubin: 0.3 mg/dL (ref 0.2–1.2)
Total Protein: 7.2 g/dL (ref 6.0–8.3)

## 2024-01-30 LAB — LIPID PANEL
Cholesterol: 141 mg/dL (ref 0–200)
HDL: 46.6 mg/dL (ref >39.00)
LDL Cholesterol: 65 mg/dL (ref 0–99)
NonHDL: 94.27
Total CHOL/HDL Ratio: 3
Triglycerides: 145 mg/dL (ref 0.0–149.0)
VLDL: 29 mg/dL (ref 0.0–40.0)

## 2024-01-30 LAB — CBC WITH DIFFERENTIAL/PLATELET
Basophils Absolute: 0 10*3/uL (ref 0.0–0.1)
Basophils Relative: 0.4 % (ref 0.0–3.0)
Eosinophils Absolute: 0.1 10*3/uL (ref 0.0–0.7)
Eosinophils Relative: 0.8 % (ref 0.0–5.0)
HCT: 38.8 % (ref 36.0–46.0)
Hemoglobin: 12.7 g/dL (ref 12.0–15.0)
Lymphocytes Relative: 21.3 % (ref 12.0–46.0)
Lymphs Abs: 1.8 10*3/uL (ref 0.7–4.0)
MCHC: 32.7 g/dL (ref 30.0–36.0)
MCV: 86.7 fl (ref 78.0–100.0)
Monocytes Absolute: 0.7 10*3/uL (ref 0.1–1.0)
Monocytes Relative: 7.8 % (ref 3.0–12.0)
Neutro Abs: 5.9 10*3/uL (ref 1.4–7.7)
Neutrophils Relative %: 69.7 % (ref 43.0–77.0)
Platelets: 300 10*3/uL (ref 150.0–400.0)
RBC: 4.48 Mil/uL (ref 3.87–5.11)
RDW: 15.1 % (ref 11.5–15.5)
WBC: 8.5 10*3/uL (ref 4.0–10.5)

## 2024-01-30 LAB — VITAMIN B12: Vitamin B-12: >1537 pg/mL — ABNORMAL HIGH (ref 211–911)

## 2024-01-30 LAB — HEMOGLOBIN A1C: Hgb A1c MFr Bld: 7.4 % — ABNORMAL HIGH (ref 4.6–6.5)

## 2024-01-30 NOTE — Patient Instructions (Signed)
 It was great to see you!  We are checking your labs today and will let you know the results via mychart/phone.   Let's follow-up in 4 months, sooner if you have concerns.  If a referral was placed today, you will be contacted for an appointment. Please note that routine referrals can sometimes take up to 3-4 weeks to process. Please call our office if you haven't heard anything after this time frame.  Take care,  Rodman Pickle, NP

## 2024-01-30 NOTE — Assessment & Plan Note (Signed)
 Chronic, stable. Continue aspirin 81 mg daily. Check CMP, CBC, lipid panel today.

## 2024-01-30 NOTE — Assessment & Plan Note (Addendum)
 Chronic, stable. Currently controlled with diet. Follow-up in 3 months. Check CMP, CBC, A1c, and urine microalbumin today.

## 2024-01-30 NOTE — Assessment & Plan Note (Signed)
 Chronic diarrhea is linked to the absence of a gallbladder. A low-fat diet effectively manages symptoms along with questran 4g daily. Continue the low-fat diet.

## 2024-01-30 NOTE — Assessment & Plan Note (Signed)
 Chronic, stable. She is taking diltiazem 120 mg daily.  We will continue this regimen.  She is not on anticoagulation due to family members having bleeding issues with this in the past.  Continue aspirin 81 mg daily.  Follow-up in 6 months or sooner with concerns.

## 2024-01-30 NOTE — Progress Notes (Signed)
 Established Patient Office Visit  Subjective   Patient ID: Angel Holland, female    DOB: December 25, 1940  Age: 83 y.o. MRN: [id number]  Chief Complaint  Patient presents with   Diabetes    Follow up and discuss medications    HPI  Discussed the use of AI scribe software for clinical note transcription with the patient, who gave verbal consent to proceed.  History of Present Illness   The patient, with a history of hypertension and gastrointestinal issues, presents with a recent issue of stumbling. The patient reports that this has been occurring for several months and describes it as a sudden stop while walking, often causing her to stumble or hit a wall. The patient denies any associated weakness, numbness, or tingling in the feet. There is no reported slurring of speech or headaches. The patient also mentions a recent weight gain of about 10 pounds, which she attributes to changes in medication and dietary habits.  In addition to the stumbling issue, the patient also discusses changes in bowel movements. She reports a history of chronic diarrhea, which has improved significantly after dietary modifications and starting [place]. The patient notes that she no longer needs to wear a depend and has not had any accidents since the changes. However, she mentions that consuming fatty foods can still trigger bowel movements.  The patient also discusses her medication regimen, including diltiazem and meloxicam. She expresses a desire to adjust the timing of her diltiazem dose and mentions that she only takes meloxicam as needed due to a previous side effect.         ROS See pertinent positives and negatives per HPI.    Objective:     BP 122/72 (BP Location: Left Arm, Patient Position: Sitting, Cuff Size: Normal)   Pulse 77   Temp (!) 97.3 F (36.3 C)   Ht 4\' 10"  (1.473 m)   Wt 137 lb 3.2 oz (62.2 kg)   SpO2 96%   BMI 28.67 kg/m  BP Readings from Last 3 Encounters:  01/30/24 122/72   01/01/24 128/70  11/15/23 (!) 150/80   Wt Readings from Last 3 Encounters:  01/30/24 137 lb 3.2 oz (62.2 kg)  01/01/24 137 lb (62.1 kg)  11/15/23 135 lb 8 oz (61.5 kg)      Physical Exam Vitals and nursing note reviewed.  Constitutional:      General: She is not in acute distress.    Appearance: Normal appearance.  HENT:     Head: Normocephalic.  Eyes:     Conjunctiva/sclera: Conjunctivae normal.  Cardiovascular:     Rate and Rhythm: Normal rate and regular rhythm.     Pulses: Normal pulses.     Heart sounds: Normal heart sounds.  Pulmonary:     Effort: Pulmonary effort is normal.     Breath sounds: Normal breath sounds.  Musculoskeletal:     Cervical back: Normal range of motion.  Skin:    General: Skin is warm.  Neurological:     General: No focal deficit present.     Mental Status: She is alert and oriented to person, place, and time.  Psychiatric:        Mood and Affect: Mood normal.        Behavior: Behavior normal.        Thought Content: Thought content normal.        Judgment: Judgment normal.     Assessment & Plan:   Problem List Items Addressed This Visit  Cardiovascular and Mediastinum   PAF (paroxysmal atrial fibrillation) (HCC)   Chronic, stable. She is taking diltiazem 120 mg daily.  We will continue this regimen.  She is not on anticoagulation due to family members having bleeding issues with this in the past.  Continue aspirin 81 mg daily.  Follow-up in 6 months or sooner with concerns.        Endocrine   Diabetes mellitus type II, controlled (HCC) - Primary   Chronic, stable. Currently controlled with diet. Follow-up in 3 months. Check CMP, CBC, A1c, and urine microalbumin today.       Relevant Orders   CBC with Differential/Platelet   Comprehensive metabolic panel with GFR   Hemoglobin A1c   Vitamin B12   Microalbumin / creatinine urine ratio   Hyperlipidemia associated with type 2 diabetes mellitus (HCC)   Chronic, stable.  Continue aspirin 81 mg daily. Check CMP, CBC, lipid panel today.       Relevant Orders   CBC with Differential/Platelet   Comprehensive metabolic panel with GFR   Lipid panel     Other   Diarrhea   Chronic diarrhea is linked to the absence of a gallbladder. A low-fat diet effectively manages symptoms along with questran 4g daily. Continue the low-fat diet.       Balance problem   Stumbling and unsteadiness may be related to footwear, with no neurological symptoms reported. Order blood work to check electrolytes and other potential imbalance causes. Consider alternative footwear. She does not have the ability to get to a store to shop for new shoes and does not have internet at home. Will place referral to SDOH.       Relevant Orders   AMB Referral [facility] Care Management   Return in about 4 months (around 05/31/2024) for Diabetes.    Gerre Scull, NP

## 2024-01-30 NOTE — Assessment & Plan Note (Signed)
 Stumbling and unsteadiness may be related to footwear, with no neurological symptoms reported. Order blood work to check electrolytes and other potential imbalance causes. Consider alternative footwear. She does not have the ability to get to a store to shop for new shoes and does not have internet at home. Will place referral to SDOH.

## 2024-02-06 ENCOUNTER — Telehealth: Payer: Self-pay

## 2024-02-06 NOTE — Progress Notes (Signed)
 Complex Care Management Note  Care Guide Note 02/06/2024 Name: SHAKHIA GRAMAJO MRN: [id number] DOB: Jan 03, 1941  NELIDA MANDARINO is a 83 y.o. year old female who sees McElwee, Lauren A, NP for primary care. I reached out to Beatrix Fetters by phone today to offer complex care management services.  Ms. Winther was given information about Complex Care Management services today including:   The Complex Care Management services include support from the care team which includes your Nurse Care Manager, Clinical Social Worker, or Pharmacist.  The Complex Care Management team is here to help remove barriers to the health concerns and goals most important to you. Complex Care Management services are voluntary, and the patient may decline or stop services at any time by request to their care team member.   Complex Care Management Consent Status: Patient did not agree to participate in complex care management services at this time.  Follow up plan:  Patient will follow up with PCP if needs change.  Encounter Outcome:  Patient Refused  Baruch Gouty [facility], [facility] Care Management Assistant Direct Dial: [phone]  Fax: [phone]

## 2024-02-29 ENCOUNTER — Other Ambulatory Visit: Payer: Self-pay | Admitting: Gastroenterology

## 2024-03-03 ENCOUNTER — Ambulatory Visit (INDEPENDENT_AMBULATORY_CARE_PROVIDER_SITE_OTHER): Payer: Medicare Other

## 2024-03-03 DIAGNOSIS — Z0000 Encounter for general adult medical examination without abnormal findings: Secondary | ICD-10-CM | POA: Diagnosis not present

## 2024-03-03 DIAGNOSIS — I491 Atrial premature depolarization: Secondary | ICD-10-CM | POA: Diagnosis not present

## 2024-03-03 DIAGNOSIS — R002 Palpitations: Secondary | ICD-10-CM | POA: Diagnosis not present

## 2024-03-03 NOTE — Patient Instructions (Signed)
 [name] , Thank you for taking time to come for your Medicare Wellness Visit. I appreciate your ongoing commitment to your health goals. Please review the following plan we discussed and let me know if I can assist you in the future.   Referrals/Orders/Follow-Ups/Clinician Recommendations: none  This is a list of the screening recommended for you and due dates:  Health Maintenance  Topic Date Due   Yearly kidney health urinalysis for diabetes  12/12/2023   COVID-19 Vaccine (8 - Pfizer risk 2024-25 season) 01/30/2024   DEXA scan (bone density measurement)  07/11/2025*   Flu Shot  05/30/2024   Eye exam for diabetics  06/19/2024   Hemoglobin A1C  07/31/2024   Mammogram  08/21/2024   DTaP/Tdap/Td vaccine (3 - Td or Tdap) 11/02/2024   Yearly kidney function blood test for diabetes  01/29/2025   Complete foot exam   01/29/2025   Medicare Annual Wellness Visit  03/03/2025   Pneumonia Vaccine  Completed   Zoster (Shingles) Vaccine  Completed   HPV Vaccine  Aged Out   Meningitis B Vaccine  Aged Out  *Topic was postponed. The date shown is not the original due date.    Advanced directives: (Copy Requested) Please bring a copy of your health care power of attorney and living will to the office to be added to your chart at your convenience. You can mail to [facility] [address] [place], [place] [address] or email to [email or web address]  Next Medicare Annual Wellness Visit scheduled for next year: Yes  Have you seen your provider in the last 6 months (3 months if uncontrolled diabetes)? Yes, has appointment in August  insert Preventive Care attachment Insert FALL PREVENTION attachment if needed

## 2024-03-03 NOTE — Progress Notes (Signed)
 Subjective:   Angel Holland is a 83 y.o. who presents for a Medicare Wellness preventive visit.  Visit Complete: Virtual I connected with  Sinclair Due on 03/03/24 by a audio enabled telemedicine application and verified that I am speaking with the correct person using two identifiers.  Patient Location: Home  Provider Location: Office/Clinic  I discussed the limitations of evaluation and management by telemedicine. The patient expressed understanding and agreed to proceed.  Vital Signs: Because this visit was a virtual/telehealth visit, some criteria may be missing or patient reported. Any vitals not documented were not able to be obtained and vitals that have been documented are patient reported.  VideoError- Librarian, academic were attempted between this provider and patient, however failed, due to patient having technical difficulties OR patient did not have access to video capability.  We continued and completed visit with audio only.   Persons Participating in Visit: Patient.  AWV Questionnaire: No: Patient Medicare AWV questionnaire was not completed prior to this visit.  Cardiac Risk Factors include: advanced age (>39men, >41 women);diabetes mellitus;dyslipidemia     Objective:    Today's Vitals   03/03/24 1126  PainSc: 4    There is no height or weight on file to calculate BMI.     03/03/2024   11:37 AM 03/05/2023   10:21 AM 12/08/2022   11:08 AM 12/05/2021    3:08 PM 12/02/2020    3:02 PM 01/30/2020    3:56 PM 12/01/2019   10:15 AM  Advanced Directives  Does Patient Have a Medical Advance Directive? Yes Yes Yes Yes Yes No Yes  Type of Estate agent of [place];Living will  Healthcare Power of [place];Living will Healthcare Power of [place];Living will Healthcare Power of [place];Living will  Healthcare Power of [place];Living will  Does patient want to make changes to medical advance directive?  No - Patient declined  No -  Patient declined No - Patient declined  No - Patient declined  Copy of Healthcare Power of Attorney in Chart? No - copy requested  No - copy requested No - copy requested No - copy requested  No - copy requested  Would patient like information on creating a medical advance directive?      No - Patient declined     Current Medications (verified) Outpatient Encounter Medications as of 03/03/2024  Medication Sig   Ascorbic Acid  (VITAMIN C  PO) Take 1,500 mg by mouth 2 (two) times daily with a meal. CHEWABLES   aspirin  EC 81 MG tablet Take 81 mg by mouth See admin instructions. Take one tablet (81 mg) by mouth every morning, may also take one tablet (81 mg) at night as needed for pain/sleep   cholestyramine  (QUESTRAN ) 4 g packet MIX AND DRINK 1 PACKET(4 GRAMS) BY MOUTH AT BEDTIME   Cyanocobalamin  (VITAMIN B-12 PO) Take 130 mg by mouth daily with supper.   diltiazem  (CARDIZEM  CD) 120 MG 24 hr capsule TAKE 1 CAPSULE(120 MG) BY MOUTH DAILY   meloxicam  (MOBIC ) 7.5 MG tablet TAKE 1 TABLET(7.5 MG) BY MOUTH DAILY (Patient taking differently: Take 7.5 mg by mouth as needed.)   Multiple Vitamins-Minerals (PRESERVISION AREDS PO) Take by mouth in the morning and at bedtime. CHEWABLE   UNABLE TO FIND daily. Med Name: CAL MAG PLUS SHAKLEE CHEWABLES   VITAMIN D PO Take 1,000 Units by mouth daily with breakfast.   AMBULATORY NON FORMULARY MEDICATION Take 2 capsules by mouth daily. Medication Name: Altamese Jest (Patient not taking:  Reported on 01/30/2024)   triamcinolone  (NASACORT ) 55 MCG/ACT AERO nasal inhaler Place 2 sprays into the nose daily as needed (congestion/sinus headache). (Patient not taking: Reported on 03/03/2024)   No facility-administered encounter medications on file as of 03/03/2024.    Allergies (verified) Hydrocodone , Biaxin [clarithromycin], Codeine, Decongestant [pseudoephedrine hcl er], Hydrocodone -ibuprofen , Mobic  [meloxicam ], Phenylephrine , Rosuvastatin , and Acetaminophen     History: Past Medical History:  Diagnosis Date   Angio-edema    Anxiety    no meds   Arthritis    back and fingers    basal cell carcinoma    Bruxism    Colon polyps    DDD (degenerative disc disease), lumbar    DDD (degenerative disc disease), lumbosacral    Depression    no meds   Diabetes mellitus without complication Type 2    Diverticulosis of colon (without mention of hemorrhage)    DJD (degenerative joint disease)    lower back   Dysphagia    trouble with very  large pills @ times   Dysrhythmia    Fibroid uterus    Hemorrhoids    History of COVID-19 09/2022   History of migraine    age 83's none since   Hx of diverticulitis of colon 09/2022   resolved   Hypertension    Irritable bowel syndrome with diarrhea and fecal incontinence    PAF (paroxysmal atrial fibrillation) (HCC) 01/2018   Pulmonary nodules - reports followed by pulmonology, reports following up with them in June 2015 03/26/2013   Repeat CT chest 04/20/14 > 1. Scattered pulmonary nodules measure 4 mm or less in size, are unchanged from 03/28/2013 and are therefore considered benign     Rosacea    Wears glasses    Past Surgical History:  Procedure Laterality Date   APPENDECTOMY  1957   CATARACT EXTRACTION     Left June 2021-Right 05/2020   CHOLECYSTECTOMY  2009   colonscopy  11/2017   DILATION AND CURETTAGE OF UTERUS     fibroid removed from uterus  1967   HYSTEROSCOPY WITH D & C N/A 09/09/2015   Procedure: DILATATION AND CURETTAGE /HYSTEROSCOPY;  Surgeon: Reggy Capers, MD;  Location: [facility];  Service: Gynecology;  Laterality: N/A;   HYSTEROSCOPY WITH D & C N/A 03/05/2023   Procedure: DILATATION AND CURETTAGE /HYSTEROSCOPY;  Surgeon: Margaretmary Shaver, MD;  Location: [facility];  Service: Gynecology;  Laterality: N/A;   TONSILLECTOMY     Family History  Problem Relation Age of Onset   Heart disease Mother        CHF   Diverticulosis Mother    Bone cancer Father         found in his mouth first   Colon cancer Neg Hx    Stomach cancer Neg Hx    Esophageal cancer Neg Hx    Pancreatic cancer Neg Hx    Colon polyps Neg Hx    Social History   Socioeconomic History   Marital status: Widowed    Spouse name: Not on file   Number of children: 2   Years of education: Not on file   Highest education level: Some college, no degree  Occupational History   Occupation: retired    Comment: Audiological scientist  Tobacco Use   Smoking status: Former    Current packs/day: 0.00    Average packs/day: 1 pack/day for 50.0 years (50.0 ttl pk-yrs)    Types: Cigarettes    Start date: 07/23/1958    Quit date: 07/23/2008  Years since quitting: 15.6    Passive exposure: Never   Smokeless tobacco: Never  Vaping Use   Vaping status: Never Used  Substance and Sexual Activity   Alcohol use: No   Drug use: No   Sexual activity: Not Currently  Other Topics Concern   Not on file  Social History Narrative            Social Drivers of Health   Financial Resource Strain: Low Risk  (03/03/2024)   Overall Financial Resource Strain (CARDIA)    Difficulty of Paying Living Expenses: Not hard at all  Food Insecurity: No Food Insecurity (03/03/2024)   Hunger Vital Sign    Worried About Running Out of Food in the Last Year: Never true    Ran Out of Food in the Last Year: Never true  Transportation Needs: No Transportation Needs (03/03/2024)   PRAPARE - Administrator, Civil Service (Medical): No    Lack of Transportation (Non-Medical): No  Physical Activity: Inactive (03/03/2024)   Exercise Vital Sign    Days of Exercise per Week: 0 days    Minutes of Exercise per Session: 0 min  Stress: Stress Concern Present (03/03/2024)   [employer] of Occupational Health - Occupational Stress Questionnaire    Feeling of Stress : To some extent  Social Connections: Moderately Isolated (03/03/2024)   Social Connection and Isolation Panel [NHANES]    Frequency of Communication with  Friends and Family: Never    Frequency of Social Gatherings with Friends and Family: Once a week    Attends Religious Services: More than 4 times per year    Active Member of [employer] or Organizations: Yes    Attends Banker Meetings: More than 4 times per year    Marital Status: Widowed    Tobacco Counseling Counseling given: Not Answered    Clinical Intake:  Pre-visit preparation completed: Yes  Pain : 0-10 Pain Score: 4  Pain Type: Chronic pain Pain Location: Hip Pain Orientation: Left Pain Radiating Towards: down to knee Pain Descriptors / Indicators: Dull Pain Onset: More than a month ago Pain Frequency: Intermittent     Nutritional Risks: None Diabetes: Yes CBG done?: No Did pt. bring in CBG monitor from home?: No  Lab Results  Component Value Date   HGBA1C 7.4 (H) 01/30/2024   HGBA1C 6.6 (H) 07/31/2023   HGBA1C 6.6 (H) 11/13/2022     How often do you need to have someone help you when you read instructions, pamphlets, or other written materials from your doctor or pharmacy?: 1 - Never  Interpreter Needed?: No  Information entered by :: NAllen LPN   Activities of Daily Living     03/03/2024   11:28 AM 03/05/2023   10:28 AM  In your present state of health, do you have any difficulty performing the following activities:  Hearing? 0 0  Vision? 0 0  Difficulty concentrating or making decisions? 0 0  Walking or climbing stairs? 1 1  Dressing or bathing? 0 0  Doing errands, shopping? 0   Preparing Food and eating ? N   Using the Toilet? N   In the past six months, have you accidently leaked urine? Y   Comment with sneeze or cough   Do you have problems with loss of bowel control? N   Managing your Medications? N   Managing your Finances? N   Housekeeping or managing your Housekeeping? N     Patient Care Team: Odette Benjamin,  NP as PCP - General (Internal Medicine) Glory Larsen, MD as Referring Physician (Dermatology)  Indicate  any recent Medical Services you may have received from other than [facility] providers in the past year (date may be approximate).     Assessment:   This is a routine wellness examination for Angel Holland.  Hearing/Vision screen Hearing Screening - Comments:: Denies hearing issues Vision Screening - Comments:: Regular eye exams, [facility]   Goals Addressed             This Visit's Progress    Patient Stated       03/03/2024, denies goals       Depression Screen     03/03/2024   11:40 AM 01/30/2024   10:24 AM 12/08/2022   11:10 AM 12/05/2021    2:52 PM 12/02/2020    3:06 PM 08/11/2019    9:00 AM 07/22/2018    8:16 AM  PHQ 2/9 Scores  PHQ - 2 Score 3 3 0 0 0 0 0  PHQ- 9 Score 7 12   0      Fall Risk     03/03/2024   11:38 AM 01/30/2024   10:23 AM 12/11/2022    1:11 PM 12/08/2022   11:09 AM 11/13/2022    9:51 AM  Fall Risk   Falls in the past year? 1 0 0 0 0  Comment tripped      Number falls in past yr: 0 0 0 0   Injury with Fall? 0 0 0 0   Risk for fall due to : Medication side effect;Impaired mobility No Fall Risks  Medication side effect No Fall Risks  Follow up Falls prevention discussed;Falls evaluation completed Falls evaluation completed  Falls prevention discussed;Education provided;Falls evaluation completed Falls evaluation completed    MEDICARE RISK AT HOME:  Medicare Risk at Home Any stairs in or around the home?: No If so, are there any without handrails?: No Home free of loose throw rugs in walkways, pet beds, electrical cords, etc?: Yes Adequate lighting in your home to reduce risk of falls?: Yes Life alert?: Yes Use of a cane, walker or w/c?: Yes Grab bars in the bathroom?: Yes Shower chair or bench in shower?: Yes Elevated toilet seat or a handicapped toilet?: No  TIMED UP AND GO:  Was the test performed?  No  Cognitive Function: 6CIT completed        03/03/2024   11:42 AM 12/08/2022   11:12 AM 12/05/2021    3:01 PM 12/01/2019   10:12 AM  6CIT Screen  What Year? 0  points 0 points 0 points 0 points  What month? 0 points 0 points 0 points 0 points  What time? 0 points 0 points 0 points 0 points  Count back from 20 0 points 0 points 0 points 0 points  Months in reverse 0 points 0 points 0 points 0 points  Repeat phrase 2 points 0 points 0 points 0 points  Total Score 2 points 0 points 0 points 0 points    Immunizations Immunization History  Administered Date(s) Administered   Fluad Quad(high Dose 65+) 07/31/2019, 07/21/2020, 07/27/2021, 07/28/2022   Fluad Trivalent(High Dose 65+) 07/31/2023   Influenza Split 06/30/2012   Influenza Whole 10/30/2006, 07/20/2010   Influenza, High Dose Seasonal PF 07/12/2015, 07/13/2016, 07/17/2017   Influenza,inj,Quad PF,6+ Mos 07/24/2013, 07/22/2018   Influenza,inj,quad, With Preservative 07/30/2020   Influenza-Unspecified 06/30/2014   PFIZER Comirnaty(Gray Top)Covid-19 Tri-Sucrose Vaccine 04/06/2021   PFIZER(Purple Top)SARS-COV-2 Vaccination 12/26/2019, 01/20/2020, 08/18/2020   Pfizer  Covid-19 Vaccine Bivalent Booster 28yrs & up 08/01/2021, 08/03/2022   Pfizer(Comirnaty)Fall Seasonal Vaccine 12 years and older 08/01/2023   Pneumococcal Conjugate-13 07/12/2015   Pneumococcal Polysaccharide-23 10/30/2004, 07/24/2013   Td 10/30/2004   Tdap 11/02/2014   Zoster Recombinant(Shingrix) 07/24/2018, 09/22/2018   Zoster, Live 11/02/2014    Screening Tests Health Maintenance  Topic Date Due   Diabetic kidney evaluation - Urine ACR  12/12/2023   COVID-19 Vaccine (8 - Pfizer risk 2024-25 season) 01/30/2024   DEXA SCAN  07/11/2025 (Originally 10/09/2005)   INFLUENZA VACCINE  05/30/2024   OPHTHALMOLOGY EXAM  06/19/2024   HEMOGLOBIN A1C  07/31/2024   MAMMOGRAM  08/21/2024   DTaP/Tdap/Td (3 - Td or Tdap) 11/02/2024   Diabetic kidney evaluation - eGFR measurement  01/29/2025   FOOT EXAM  01/29/2025   Medicare Annual Wellness (AWV)  03/03/2025   Pneumonia Vaccine 31+ Years old  Completed   Zoster Vaccines- Shingrix   Completed   HPV VACCINES  Aged Out   Meningococcal B Vaccine  Aged Out    Health Maintenance  Health Maintenance Due  Topic Date Due   Diabetic kidney evaluation - Urine ACR  12/12/2023   COVID-19 Vaccine (8 - Pfizer risk 2024-25 season) 01/30/2024   Health Maintenance Items Addressed: Due for micro. Has appointment in August.  Additional Screening:  Vision Screening: Recommended annual ophthalmology exams for early detection of glaucoma and other disorders of the eye.  Dental Screening: Recommended annual dental exams for proper oral hygiene  Community Resource Referral / Chronic Care Management: CRR required this visit?  No   CCM required this visit?  No     Plan:     I have personally reviewed and noted the following in the patient's chart:   Medical and social history Use of alcohol, tobacco or illicit drugs  Current medications and supplements including opioid prescriptions. Patient is not currently taking opioid prescriptions. Functional ability and status Nutritional status Physical activity Advanced directives List of other physicians Hospitalizations, surgeries, and ER visits in previous 12 months Vitals Screenings to include cognitive, depression, and falls Referrals and appointments  In addition, I have reviewed and discussed with patient certain preventive protocols, quality metrics, and best practice recommendations. A written personalized care plan for preventive services as well as general preventive health recommendations were provided to patient.     Areatha Beecham, LPN   11/30/3084   After Visit Summary: (Pick Up) Due to this being a telephonic visit, with patients personalized plan was offered to patient and patient has requested to Pick up at office.  Notes: Nothing significant to report at this time.

## 2024-03-04 ENCOUNTER — Ambulatory Visit: Payer: Self-pay

## 2024-03-04 NOTE — Telephone Encounter (Signed)
  Chief Complaint: chest pain Symptoms: left sided chest pain Frequency: intermittent, lasted a few seconds and resolved now Pertinent Negatives: Patient denies chest pain at present, difficulty breathing, dizziness/lightheaded Disposition: [] ED /[] Urgent Care (no appt availability in office) / [x] Appointment(In office/virtual)/ []  [facility]/ [] Home Care/ [x] Refused Recommended Disposition /[] [facility]/ []  Follow-up with PCP Additional Notes: Patient had called rescue yesterday and they came out and checked her out, including doing an EKG. She was instructed to chew 4 baby aspirin  and she states she was better after rescue checked on her. Offered available provider tomorrow as patient states she can not come in Thursday or Friday. Patient refused and states she will only see her PCP. Offered Thursday appts with PCP and patient states those times won't work for her.  Copied from CRM [id number]. Topic: Clinical - Red Word Triage >> Mar 04, 2024  3:06 PM Alyse July wrote: Red Word that prompted transfer to Nurse Triage: chest pain yesterday Reason for Disposition  [1] Chest pain lasts < 5 minutes AND [2] NO chest pain or cardiac symptoms (e.g., breathing difficulty, sweating) now  (Exception: Chest pains that last only a few seconds.)  Answer Assessment - Initial Assessment Questions 1. LOCATION: "Where does it hurt?"       Around and under left breast.  2. RADIATION: "Does the pain go anywhere else?" (e.g., into neck, jaw, arms, back)     Denies.  3. ONSET: "When did the chest pain begin?" (Minutes, hours or days)      Yesterday around 9pm.  4. PATTERN: "Does the pain come and go, or has it been constant since it started?"  "Does it get worse with exertion?"      Comes and goes and not having it today or at present.  5. DURATION: "How long does it last" (e.g., seconds, minutes, hours)     3-4 pains were instant and went away, 30 minutes later a pain hit her that was  dull it lasted seconds.  6. SEVERITY: "How bad is the pain?"  (e.g., Scale 1-10; mild, moderate, or severe)    - MILD (1-3): doesn't interfere with normal activities     - MODERATE (4-7): interferes with normal activities or awakens from sleep    - SEVERE (8-10): excruciating pain, unable to do any normal activities       Not present.  7. CARDIAC RISK FACTORS: "Do you have any history of heart problems or risk factors for heart disease?" (e.g., angina, prior heart attack; diabetes, high blood pressure, high cholesterol, smoker, or strong family history of heart disease)     Diastolic heart failure, atherosclerosis, Paroxysmal a fib. 8. PULMONARY RISK FACTORS: "Do you have any history of lung disease?"  (e.g., blood clots in lung, asthma, emphysema, birth control pills)     Denies. 9. CAUSE: "What do you think is causing the chest pain?"     Unsure, she states she was sitting watching television when it happened and drinking water. 10. OTHER SYMPTOMS: "Do you have any other symptoms?" (e.g., dizziness, nausea, vomiting, sweating, fever, difficulty breathing, cough)       Balance problems/fall about 2-3 weeks ago, 11. PREGNANCY: "Is there any chance you are pregnant?" "When was your last menstrual period?"       N/A.  Protocols used: Chest Pain-A-AH

## 2024-03-04 NOTE — Telephone Encounter (Signed)
 I called and spoke with patient and she said that she has no pain now. She said that she called 911 last night around 9pm and was instructed to take 4 aspirin  and she did. Ms. Neurohr said that she feels fine and did not see why I need to schedule her an appointment to be seen in the office. She said that she will come in if Barbra Ley thinks that she needs to. Ms. Hee said that she can not come in on Thursday or Friday. I told her that I will should Lauren the message and will call her back in the morning. I advised patient that if she has anymore pain to call 911 and she agreed.

## 2024-03-05 ENCOUNTER — Ambulatory Visit (INDEPENDENT_AMBULATORY_CARE_PROVIDER_SITE_OTHER): Admitting: Nurse Practitioner

## 2024-03-05 ENCOUNTER — Encounter: Payer: Self-pay | Admitting: Nurse Practitioner

## 2024-03-05 VITALS — BP 122/70 | HR 73 | Temp 96.9°F | Ht <= 58 in | Wt 134.6 lb

## 2024-03-05 DIAGNOSIS — E119 Type 2 diabetes mellitus without complications: Secondary | ICD-10-CM

## 2024-03-05 DIAGNOSIS — Z85828 Personal history of other malignant neoplasm of skin: Secondary | ICD-10-CM | POA: Diagnosis not present

## 2024-03-05 DIAGNOSIS — R079 Chest pain, unspecified: Secondary | ICD-10-CM | POA: Insufficient documentation

## 2024-03-05 LAB — COMPREHENSIVE METABOLIC PANEL WITH GFR
ALT: 12 U/L (ref 0–35)
AST: 14 U/L (ref 0–37)
Albumin: 4.6 g/dL (ref 3.5–5.2)
Alkaline Phosphatase: 64 U/L (ref 39–117)
BUN: 21 mg/dL (ref 6–23)
CO2: 25 meq/L (ref 19–32)
Calcium: 9.6 mg/dL (ref 8.4–10.5)
Chloride: 105 meq/L (ref 96–112)
Creatinine, Ser: 0.65 mg/dL (ref 0.40–1.20)
GFR: 81.43 mL/min (ref >60.00)
Glucose, Bld: 92 mg/dL (ref 70–99)
Potassium: 4.2 meq/L (ref 3.5–5.1)
Sodium: 138 meq/L (ref 135–145)
Total Bilirubin: 0.3 mg/dL (ref 0.2–1.2)
Total Protein: 7 g/dL (ref 6.0–8.3)

## 2024-03-05 LAB — MICROALBUMIN / CREATININE URINE RATIO
Creatinine,U: 102.2 mg/dL
Microalb Creat Ratio: 9.1 mg/g (ref 0.0–30.0)
Microalb, Ur: 0.9 mg/dL (ref 0.0–1.9)

## 2024-03-05 LAB — CBC WITH DIFFERENTIAL/PLATELET
Basophils Absolute: 0 10*3/uL (ref 0.0–0.1)
Basophils Relative: 0.5 % (ref 0.0–3.0)
Eosinophils Absolute: 0.1 10*3/uL (ref 0.0–0.7)
Eosinophils Relative: 0.6 % (ref 0.0–5.0)
HCT: 39.2 % (ref 36.0–46.0)
Hemoglobin: 12.8 g/dL (ref 12.0–15.0)
Lymphocytes Relative: 21.6 % (ref 12.0–46.0)
Lymphs Abs: 1.9 10*3/uL (ref 0.7–4.0)
MCHC: 32.7 g/dL (ref 30.0–36.0)
MCV: 87.8 fl (ref 78.0–100.0)
Monocytes Absolute: 0.7 10*3/uL (ref 0.1–1.0)
Monocytes Relative: 7.8 % (ref 3.0–12.0)
Neutro Abs: 6.1 10*3/uL (ref 1.4–7.7)
Neutrophils Relative %: 69.5 % (ref 43.0–77.0)
Platelets: 322 10*3/uL (ref 150.0–400.0)
RBC: 4.47 Mil/uL (ref 3.87–5.11)
RDW: 14.8 % (ref 11.5–15.5)
WBC: 8.8 10*3/uL (ref 4.0–10.5)

## 2024-03-05 LAB — TROPONIN I: Troponin I: 4 ng/L (ref <=47)

## 2024-03-05 NOTE — Assessment & Plan Note (Signed)
 One episode of chest pain 2 nights ago, followed by a dull ache about 30 minutes later. She has not had any pain since then.  EMS did an EKG which showed normal sinus rhythm with one PAC. Vital signs were stable. Will check CMP, CBC, and troponin today. Encouraged her to call office if pain happens again or to go to ER. Continue aspirin  81mg  daily.

## 2024-03-05 NOTE — Assessment & Plan Note (Signed)
 She has multiple skin malignancies with excisions and concerns about a scalp lesion and other skin changes. A dermatology follow-up is scheduled. Ensure she attends the dermatology appointment on June 4th for evaluation of skin lesions.

## 2024-03-05 NOTE — Telephone Encounter (Addendum)
 I called and spoke with patient and she can come in today at 1pm to be seen and appointment made and patient aware.

## 2024-03-05 NOTE — Patient Instructions (Signed)
 It was great to see you!  We are checking your labs today and will let you know the results via mychart/phone.   Let me know if you have any more chest pain  Let's follow-up in August at your next appointment, sooner if you have concerns.  If a referral was placed today, you will be contacted for an appointment. Please note that routine referrals can sometimes take up to 3-4 weeks to process. Please call our office if you haven't heard anything after this time frame.  Take care,  Rheba Cedar, NP

## 2024-03-05 NOTE — Progress Notes (Signed)
 Acute Office Visit  Subjective:     Patient ID: Angel Holland, female    DOB: July 21, 1941, 83 y.o.   MRN: [id number]  Chief Complaint  Patient presents with   Chest Pain    Had chest pain on Monday night, pain now    HPI Discussed the use of AI scribe software for clinical note transcription with the patient, who gave verbal consent to proceed.  History of Present Illness   Angel Holland is an 83 year old female who presents with chest pain.  She experienced sudden onset sharp chest pain under her left breast while watching television, which was brief and followed by a dull pain thirty minutes later. She states the pain did not last long. The pain did not radiate to her arm or neck, and there was no shortness of breath or sweating. She called EMS who did an EKG and took her vital signs. They recommended she go to the ER, but she did not have a ride home, so declined to go.   She is concerned about a sore growth on her scalp and has a dermatology appointment scheduled. She has a history of skin malignancies.      ROS See pertinent positives and negatives per HPI.     Objective:    BP 122/70 (BP Location: Left Arm, Patient Position: Sitting, Cuff Size: Normal)   Pulse 73   Temp (!) 96.9 F (36.1 C)   Ht 4\' 10"  (1.473 m)   Wt 134 lb 9.6 oz (61.1 kg)   SpO2 96%   BMI 28.13 kg/m  BP Readings from Last 3 Encounters:  03/05/24 122/70  01/30/24 122/72  01/01/24 128/70   Wt Readings from Last 3 Encounters:  03/05/24 134 lb 9.6 oz (61.1 kg)  01/30/24 137 lb 3.2 oz (62.2 kg)  01/01/24 137 lb (62.1 kg)      Physical Exam Vitals and nursing note reviewed.  Constitutional:      General: She is not in acute distress.    Appearance: Normal appearance.  HENT:     Head: Normocephalic.  Eyes:     Conjunctiva/sclera: Conjunctivae normal.  Cardiovascular:     Rate and Rhythm: Normal rate and regular rhythm.     Pulses: Normal pulses.     Heart sounds: Normal heart sounds.   Pulmonary:     Effort: Pulmonary effort is normal.     Breath sounds: Normal breath sounds.  Musculoskeletal:     Cervical back: Normal range of motion.  Skin:    General: Skin is warm.  Neurological:     General: No focal deficit present.     Mental Status: She is alert and oriented to person, place, and time.  Psychiatric:        Mood and Affect: Mood normal.        Behavior: Behavior normal.        Thought Content: Thought content normal.        Judgment: Judgment normal.      Assessment & Plan:   Problem List Items Addressed This Visit       Endocrine   Diabetes mellitus type II, controlled (HCC)   Diabetes is well-controlled with an HbA1c of 7.4. She is mindful of her dietary habits. Check urine microalbumin today.         Musculoskeletal and Integument   History of basal cell cancer   She has multiple skin malignancies with excisions and concerns about a scalp lesion and  other skin changes. A dermatology follow-up is scheduled. Ensure she attends the dermatology appointment on June 4th for evaluation of skin lesions.         Other   Chest pain - Primary   One episode of chest pain 2 nights ago, followed by a dull ache about 30 minutes later. She has not had any pain since then.  EMS did an EKG which showed normal sinus rhythm with one PAC. Vital signs were stable. Will check CMP, CBC, and troponin today. Encouraged her to call office if pain happens again or to go to ER. Continue aspirin  81mg  daily.       Relevant Orders   CBC with Differential/Platelet   Comprehensive metabolic panel with GFR   Troponin I    No orders of the defined types were placed in this encounter.   No follow-ups on file.  Angel Benjamin, NP

## 2024-03-05 NOTE — Assessment & Plan Note (Signed)
 Diabetes is well-controlled with an HbA1c of 7.4. She is mindful of her dietary habits. Check urine microalbumin today.

## 2024-04-02 DIAGNOSIS — D1801 Hemangioma of skin and subcutaneous tissue: Secondary | ICD-10-CM | POA: Diagnosis not present

## 2024-04-02 DIAGNOSIS — L814 Other melanin hyperpigmentation: Secondary | ICD-10-CM | POA: Diagnosis not present

## 2024-04-02 DIAGNOSIS — L821 Other seborrheic keratosis: Secondary | ICD-10-CM | POA: Diagnosis not present

## 2024-04-02 DIAGNOSIS — Z85828 Personal history of other malignant neoplasm of skin: Secondary | ICD-10-CM | POA: Diagnosis not present

## 2024-04-02 DIAGNOSIS — L820 Inflamed seborrheic keratosis: Secondary | ICD-10-CM | POA: Diagnosis not present

## 2024-04-02 DIAGNOSIS — D2262 Melanocytic nevi of left upper limb, including shoulder: Secondary | ICD-10-CM | POA: Diagnosis not present

## 2024-04-02 DIAGNOSIS — L570 Actinic keratosis: Secondary | ICD-10-CM | POA: Diagnosis not present

## 2024-04-17 ENCOUNTER — Other Ambulatory Visit: Payer: Self-pay | Admitting: Nurse Practitioner

## 2024-04-17 ENCOUNTER — Telehealth: Payer: Self-pay | Admitting: Nurse Practitioner

## 2024-04-17 MED ORDER — MELOXICAM 7.5 MG PO TABS: 7.5000 mg | ORAL_TABLET | ORAL | 1 refills | Status: DC | PRN

## 2024-04-17 NOTE — Telephone Encounter (Signed)
 Copied from CRM [id number]. Topic: Clinical - Prescription Issue >> Apr 17, 2024  9:22 AM Dewanda Foots wrote: Reason for CRM: Pt is calling to check the status of her meloxicam  (MOBIC ) 7.5 MG tablets. I let her know this can take up to 3 business days to refill but that I would send a follow up for her.  This is for this pharmacy: [facility][id number] [place], [place] - [phone] AT [facility] OF [facility] & [address] [place] [address] Phone: [phone] Fax: [phone] Hours: Not open 24 hours  Please call patient back with any updates on this: [phone]

## 2024-04-17 NOTE — Telephone Encounter (Signed)
 Copied from CRM [id number]. Topic: Clinical - Medication Refill >> Apr 17, 2024  9:03 AM Freya Jesus wrote: Medication: meloxicam  (MOBIC ) 7.5 MG tablet [[id number]]  Has the patient contacted their pharmacy? Yes (Agent: If no, request that the patient contact the pharmacy for the refill. If patient does not wish to contact the pharmacy document the reason why and proceed with request.) (Agent: If yes, when and what did the pharmacy advise?)  This is the patient's preferred pharmacy:  [facility] #[id number] [place], [place] - [address] AT [facility] OF [facility] & [address] [place] [address] Phone: [phone] Fax: [phone]   Is this the correct pharmacy for this prescription? Yes If no, delete pharmacy and type the correct one.   Has the prescription been filled recently? No  Is the patient out of the medication? No  Has the patient been seen for an appointment in the last year OR does the patient have an upcoming appointment? Yes  Can we respond through MyChart? No  Agent: Please be advised that Rx refills may take up to 3 business days. We ask that you follow-up with your pharmacy.

## 2024-04-17 NOTE — Telephone Encounter (Signed)
 Requesting: Meloxicam   Last Visit: 03/05/2024 Next Visit: 06/02/2024 Last Refill: 08/21/2023  Please Advise    I called and spoke with patient and she said that she asked for a refill on Monday from pharmacy. I told patient that we received request today and will refill for her.

## 2024-06-02 ENCOUNTER — Ambulatory Visit (INDEPENDENT_AMBULATORY_CARE_PROVIDER_SITE_OTHER): Admitting: Nurse Practitioner

## 2024-06-02 ENCOUNTER — Encounter: Payer: Self-pay | Admitting: Nurse Practitioner

## 2024-06-02 VITALS — BP 124/62 | HR 69 | Temp 97.0°F | Ht <= 58 in | Wt 128.6 lb

## 2024-06-02 DIAGNOSIS — L602 Onychogryphosis: Secondary | ICD-10-CM

## 2024-06-02 DIAGNOSIS — E119 Type 2 diabetes mellitus without complications: Secondary | ICD-10-CM | POA: Diagnosis not present

## 2024-06-02 LAB — POCT GLYCOSYLATED HEMOGLOBIN (HGB A1C)
HbA1c POC (<> result, manual entry): 6.4 % (ref 4.0–5.6)
HbA1c, POC (controlled diabetic range): 6.4 % (ref 0.0–7.0)
HbA1c, POC (prediabetic range): 6.4 % (ref 5.7–6.4)
Hemoglobin A1C: 6.4 % — AB (ref 4.0–5.6)

## 2024-06-02 NOTE — Progress Notes (Signed)
 Established Patient Office Visit  Subjective   Patient ID: Angel Holland, female    DOB: Aug 18, 1941  Age: 83 y.o. MRN: [id number]  Chief Complaint  Patient presents with   Diabetes    Follow up, concerns with toe nail coming off and bleeding for 1 week    HPI Discussed the use of AI scribe software for clinical note transcription with the patient, who gave verbal consent to proceed.  History of Present Illness   Angel Holland is an 83 year old female who presents to follow-up on diabetes and a broken toe nail.  She noticed an injury to her right second toe last week. She is unsure of the cause but experienced pain and found blood under the toenail. The toenail was elevated, and she has been using heavy-duty Band-Aids on three toes to secure it. She changes the Band-Aids daily and is cautious about removing them to prevent the toenail from detaching. The bleeding has ceased, and the area under the toenail is now black. There is no current drainage, and she experiences minimal pain unless wearing narrow shoes, which she avoids by choosing wider footwear.  She has elevated blood sugar levels, which she believes were due to orange juice consumption. She switched to [employer] drinks, which she feels has improved her sugar levels. She reports a seven-pound weight loss since her last visit, attributed to dietary changes, including eliminating potato chips and orange juice. Her granddaughter helps manage her vitamins and supplements, which she orders online.  She had a past episode of chest pain requiring emergency services but denies recent chest pain. She experienced a sharp head pain affecting her eye, which resolved quickly and has not recurred. She takes meloxicam  at night for pain and sleep, and manages dry mouth by keeping ice water nearby.       ROS See pertinent positives and negatives per HPI.    Objective:     BP 124/62 (BP Location: Right Arm, Patient Position: Sitting,  Cuff Size: Small)   Pulse 69   Temp (!) 97 F (36.1 C)   Ht 4' 10 (1.473 m)   Wt 128 lb 9.6 oz (58.3 kg)   SpO2 97%   BMI 26.88 kg/m  BP Readings from Last 3 Encounters:  06/02/24 124/62  03/05/24 122/70  01/30/24 122/72   Wt Readings from Last 3 Encounters:  06/02/24 128 lb 9.6 oz (58.3 kg)  03/05/24 134 lb 9.6 oz (61.1 kg)  01/30/24 137 lb 3.2 oz (62.2 kg)      Physical Exam Vitals and nursing note reviewed.  Constitutional:      General: She is not in acute distress.    Appearance: Normal appearance.  HENT:     Head: Normocephalic.  Eyes:     Conjunctiva/sclera: Conjunctivae normal.  Cardiovascular:     Rate and Rhythm: Normal rate and regular rhythm.     Pulses: Normal pulses.     Heart sounds: Normal heart sounds.  Pulmonary:     Effort: Pulmonary effort is normal.     Breath sounds: Normal breath sounds.  Musculoskeletal:     Cervical back: Normal range of motion.  Skin:    General: Skin is warm.     Comments: Right second toe discolored, thickened, and with dried blood under nail  Neurological:     General: No focal deficit present.     Mental Status: She is alert and oriented to person, place, and time.  Psychiatric:  Mood and Affect: Mood normal.        Behavior: Behavior normal.        Thought Content: Thought content normal.        Judgment: Judgment normal.    Results for orders placed or performed in visit on 06/02/24  POCT glycosylated hemoglobin (Hb A1C)  Result Value Ref Range   Hemoglobin A1C 6.4 (A) 4.0 - 5.6 %   HbA1c POC (<> result, manual entry) 6.4 4.0 - 5.6 %   HbA1c, POC (prediabetic range) 6.4 5.7 - 6.4 %   HbA1c, POC (controlled diabetic range) 6.4 0.0 - 7.0 %      Assessment & Plan:   Problem List Items Addressed This Visit       Endocrine   Diabetes mellitus type II, controlled (HCC) - Primary   Chronic, stable. Glycemic control has improved with dietary changes and weight loss, as evidenced by a decrease in A1c  from 7.4 to 6.4. Sugar-free chewables and vitamin sugar content are not affecting glucose levels.       Relevant Orders   POCT glycosylated hemoglobin (Hb A1C) (Completed)   Ambulatory referral to Podiatry   Other Visit Diagnoses       Thickened nails       Thickened, raised toenail to second toe right foot. No signs of infection. Will place referral to podiatry.   Relevant Orders   Ambulatory referral to Podiatry      Return in about 3 months (around 09/02/2024) for Diabetes.    Tinnie DELENA Harada, NP

## 2024-06-02 NOTE — Patient Instructions (Signed)
 It was great to see you!  I have placed a referral to podiatry they will call to schedule   Soak your foot in epsom salt and warm water for 10 minutes every day   Let's follow-up in 3 months, sooner if you have concerns.  If a referral was placed today, you will be contacted for an appointment. Please note that routine referrals can sometimes take up to 3-4 weeks to process. Please call our office if you haven't heard anything after this time frame.  Take care,  Tinnie Harada, NP

## 2024-06-02 NOTE — Assessment & Plan Note (Signed)
 Chronic, stable. Glycemic control has improved with dietary changes and weight loss, as evidenced by a decrease in A1c from 7.4 to 6.4. Sugar-free chewables and vitamin sugar content are not affecting glucose levels.

## 2024-06-19 ENCOUNTER — Ambulatory Visit (INDEPENDENT_AMBULATORY_CARE_PROVIDER_SITE_OTHER): Admitting: Podiatry

## 2024-06-19 ENCOUNTER — Encounter: Payer: Self-pay | Admitting: Podiatry

## 2024-06-19 DIAGNOSIS — B351 Tinea unguium: Secondary | ICD-10-CM | POA: Diagnosis not present

## 2024-06-19 DIAGNOSIS — M79676 Pain in unspecified toe(s): Secondary | ICD-10-CM

## 2024-06-19 NOTE — Progress Notes (Signed)
 She presents today concerned about her painful second toenail on her right foot states that is thick been thick for years and all of them are really been bothering her.  She is doing states it has been tender and she noticed some bleeding around it.  Objective: Vital signs are stable she is alert oriented x 3 toenails are thick yellow dystrophic likely mycotic I see no signs of active bleeding.  Assessment: Pain in limb secondary to onychomycosis.  Plan: Debridement of toenails 1 through 5 bilateral.

## 2024-06-27 ENCOUNTER — Other Ambulatory Visit: Payer: Self-pay | Admitting: Gastroenterology

## 2024-07-02 DIAGNOSIS — H353132 Nonexudative age-related macular degeneration, bilateral, intermediate dry stage: Secondary | ICD-10-CM | POA: Diagnosis not present

## 2024-07-10 ENCOUNTER — Ambulatory Visit (INDEPENDENT_AMBULATORY_CARE_PROVIDER_SITE_OTHER)

## 2024-07-10 ENCOUNTER — Telehealth: Payer: Self-pay

## 2024-07-10 DIAGNOSIS — Z23 Encounter for immunization: Secondary | ICD-10-CM | POA: Diagnosis not present

## 2024-07-10 MED ORDER — COVID-19 MRNA VAC-TRIS(PFIZER) 30 MCG/0.3ML IM SUSY: 0.3000 mL | PREFILLED_SYRINGE | Freq: Once | INTRAMUSCULAR | 0 refills | Status: AC

## 2024-07-10 NOTE — Telephone Encounter (Signed)
 I called and notified patient that Rx could be mailed and it may take a week to get to her and she is ok with this.

## 2024-07-10 NOTE — Progress Notes (Signed)
 Per orders of Lauren McElwee,  injection of  Influenza given by Karna Christ, cma.  Patient tolerated injection well.  Covid vaccine RX printed and given to patient.    Dm/cma

## 2024-07-10 NOTE — Telephone Encounter (Signed)
 Copied from CRM #[id number]. Topic: General - Other >> Jul 10, 2024  3:10 PM Franky GRADE wrote: Reason for CRM: Patient had her flu vaccine at the office and the nurse left a prescription for the Covid vaccine in the front desk for her. She would like to know if we can mail that prescription to her home address.

## 2024-07-10 NOTE — Telephone Encounter (Signed)
 LVM informing pt we have her signed covid prescription, it will be in the FO for her to pick up.

## 2024-07-29 ENCOUNTER — Other Ambulatory Visit: Payer: Self-pay | Admitting: Nurse Practitioner

## 2024-07-29 NOTE — Telephone Encounter (Signed)
 Requesting: DILTIAZEM  CD 120MG  CAPSULES (24 HR)  Last Visit: 06/02/2024 Next Visit: 09/03/2024 Last Refill: 08/10/2023  Please Advise

## 2024-08-18 DIAGNOSIS — D2239 Melanocytic nevi of other parts of face: Secondary | ICD-10-CM | POA: Diagnosis not present

## 2024-08-18 DIAGNOSIS — L821 Other seborrheic keratosis: Secondary | ICD-10-CM | POA: Diagnosis not present

## 2024-08-18 DIAGNOSIS — Z85828 Personal history of other malignant neoplasm of skin: Secondary | ICD-10-CM | POA: Diagnosis not present

## 2024-08-18 DIAGNOSIS — L820 Inflamed seborrheic keratosis: Secondary | ICD-10-CM | POA: Diagnosis not present

## 2024-08-18 DIAGNOSIS — L570 Actinic keratosis: Secondary | ICD-10-CM | POA: Diagnosis not present

## 2024-08-18 DIAGNOSIS — D1801 Hemangioma of skin and subcutaneous tissue: Secondary | ICD-10-CM | POA: Diagnosis not present

## 2024-09-03 ENCOUNTER — Ambulatory Visit (INDEPENDENT_AMBULATORY_CARE_PROVIDER_SITE_OTHER): Admitting: Nurse Practitioner

## 2024-09-03 ENCOUNTER — Encounter: Payer: Self-pay | Admitting: Nurse Practitioner

## 2024-09-03 VITALS — BP 118/64 | HR 71 | Temp 96.9°F | Ht <= 58 in | Wt 130.0 lb

## 2024-09-03 DIAGNOSIS — N6315 Unspecified lump in the right breast, overlapping quadrants: Secondary | ICD-10-CM | POA: Diagnosis not present

## 2024-09-03 DIAGNOSIS — Z8719 Personal history of other diseases of the digestive system: Secondary | ICD-10-CM | POA: Diagnosis not present

## 2024-09-03 DIAGNOSIS — E119 Type 2 diabetes mellitus without complications: Secondary | ICD-10-CM | POA: Diagnosis not present

## 2024-09-03 NOTE — Progress Notes (Signed)
 Established Patient Office Visit  Subjective   Patient ID: Angel Holland, female    DOB: December 23, 1940  Age: 83 y.o. MRN: [id number]  Chief Complaint  Patient presents with   Diabetes    Follow up, concerns with lump in right breast-no pain    HPI  Discussed the use of AI scribe software for clinical note transcription with the patient, who gave verbal consent to proceed.  History of Present Illness   Angel Holland is an 83 year old female who presents with a newly discovered breast lump.  She discovered a lump in her right breast a couple of months ago after experiencing an itch. The lump is non-tender and unchanged in size. She experiences 'sympathy pains' on the left side. A significant fall in 2021 or 2022 resulted in injury to the same breast, causing discoloration and disfigurement for a year, but subsequent mammograms were normal.  She has diabetes and has previously benefited from podiatry care for toenail management. She inquires about insurance coverage for toenail care without foot sores. She has a thickened toenail on the little toe that is difficult to manage.  She experienced a fall during the summer, resulting in back pain and a head injury, but currently reports no issues. She denies chest pain or shortness of breath.  She discusses dietary changes, noting gastrointestinal issues with certain foods and taking a powder medication to control her stool, which she feels is less effective. She tolerates bacon, eggs, and sausage without issues. She takes meloxicam  as needed and lives alone with no family support.       ROS See pertinent positives and negatives per HPI.   Objective:     BP 118/64 (BP Location: Left Arm, Patient Position: Sitting, Cuff Size: Normal)   Pulse 71   Temp (!) 96.9 F (36.1 C)   Ht 4' 10 (1.473 m)   Wt 130 lb (59 kg)   SpO2 96%   BMI 27.17 kg/m    Physical Exam Vitals and nursing note reviewed.  Constitutional:      General: She is  not in acute distress.    Appearance: Normal appearance.  HENT:     Head: Normocephalic.  Eyes:     Conjunctiva/sclera: Conjunctivae normal.  Cardiovascular:     Rate and Rhythm: Normal rate and regular rhythm.     Pulses: Normal pulses.     Heart sounds: Normal heart sounds.  Pulmonary:     Effort: Pulmonary effort is normal.     Breath sounds: Normal breath sounds.  Chest:  Breasts:    Right: Mass (right upper middle of breast) present. No skin change or tenderness.     Left: Normal.  Musculoskeletal:     Cervical back: Normal range of motion.  Lymphadenopathy:     Upper Body:     Right upper body: No supraclavicular, axillary or pectoral adenopathy.     Left upper body: No supraclavicular, axillary or pectoral adenopathy.  Skin:    General: Skin is warm.  Neurological:     General: No focal deficit present.     Mental Status: She is alert and oriented to person, place, and time.  Psychiatric:        Mood and Affect: Mood normal.        Behavior: Behavior normal.        Thought Content: Thought content normal.        Judgment: Judgment normal.      Assessment & Plan:  Problem List Items Addressed This Visit       Endocrine   Diabetes mellitus type II, controlled (HCC)   Chronic, stable. Continue focus on nutrition to control sugars. Can follow routinely with podiatry for nail care. Follow-up in 3 months.         Other   History of IBS - followed by [facility]   Symptoms have increased in the last 6 months, even with questran . Recommend she reach out to GI and schedule a follow-up.       Other Visit Diagnoses       Mass overlapping multiple quadrants of right breast    -  Primary   Keep mammogram scheduled tomorrow.       Return in about 3 months (around 12/04/2024) for Diabetes.    Tinnie DELENA Harada, NP

## 2024-09-03 NOTE — Assessment & Plan Note (Signed)
 Chronic, stable. Continue focus on nutrition to control sugars. Can follow routinely with podiatry for nail care. Follow-up in 3 months.

## 2024-09-03 NOTE — Assessment & Plan Note (Signed)
 Symptoms have increased in the last 6 months, even with questran . Recommend she reach out to GI and schedule a follow-up.

## 2024-09-03 NOTE — Patient Instructions (Signed)
 It was great to see you!  You can call the foot doctor to get your nails cut   Keep your mammogram tomorrow   Let's follow-up in 3 months, sooner if you have concerns.  If a referral was placed today, you will be contacted for an appointment. Please note that routine referrals can sometimes take up to 3-4 weeks to process. Please call our office if you haven't heard anything after this time frame.  Take care,  Tinnie Harada, NP

## 2024-09-04 DIAGNOSIS — R921 Mammographic calcification found on diagnostic imaging of breast: Secondary | ICD-10-CM | POA: Diagnosis not present

## 2024-09-04 DIAGNOSIS — N6315 Unspecified lump in the right breast, overlapping quadrants: Secondary | ICD-10-CM | POA: Diagnosis not present

## 2024-09-16 ENCOUNTER — Telehealth: Payer: Self-pay

## 2024-09-16 NOTE — Telephone Encounter (Signed)
 I called patient in regards to mammogram and breast ultrasound and notified her of results.

## 2024-10-10 ENCOUNTER — Other Ambulatory Visit: Payer: Self-pay | Admitting: Nurse Practitioner

## 2024-10-10 NOTE — Telephone Encounter (Signed)
 Requesting: MELOXICAM  7.5MG  TABLETS  Last Visit: 09/03/2024 Next Visit: 12/04/2024 Last Refill: 04/17/2024  Please Advise

## 2024-10-13 ENCOUNTER — Other Ambulatory Visit: Payer: Self-pay | Admitting: Gastroenterology

## 2024-10-13 ENCOUNTER — Encounter: Payer: Self-pay | Admitting: Nurse Practitioner

## 2024-12-04 ENCOUNTER — Ambulatory Visit: Admitting: Nurse Practitioner

## 2024-12-10 ENCOUNTER — Ambulatory Visit: Admitting: Nurse Practitioner

## 2025-03-06 ENCOUNTER — Ambulatory Visit
